# Patient Record
Sex: Female | Born: 1985 | Hispanic: No | Marital: Married | Smoking: Never smoker
Health system: Southern US, Community
[De-identification: ages and names within clinical notes are randomized; demographics above are authoritative.]

## PROBLEM LIST (undated history)

## (undated) ENCOUNTER — Inpatient Hospital Stay (HOSPITAL_COMMUNITY): Payer: Self-pay

## (undated) DIAGNOSIS — R7303 Prediabetes: Secondary | ICD-10-CM

## (undated) DIAGNOSIS — L509 Urticaria, unspecified: Secondary | ICD-10-CM

## (undated) DIAGNOSIS — T7840XA Allergy, unspecified, initial encounter: Secondary | ICD-10-CM

## (undated) DIAGNOSIS — O24419 Gestational diabetes mellitus in pregnancy, unspecified control: Secondary | ICD-10-CM

## (undated) HISTORY — DX: Allergy, unspecified, initial encounter: T78.40XA

## (undated) HISTORY — DX: Urticaria, unspecified: L50.9

## (undated) HISTORY — PX: WISDOM TOOTH EXTRACTION: SHX21

## (undated) NOTE — *Deleted (*Deleted)
It was great to see you!  Our plans for today:  - *** -   We are checking some labs today, I will call you if they are abnormal will send you a MyChart message or a letter if they are normal.  If you do not hear about your labs in the next 2 weeks please let us know.***  Take care and seek immediate care sooner if you develop any concerns.   Dr. Rushie Brazel [facility]  

## (undated) NOTE — *Deleted (*Deleted)
It was nice seeing you today, ***.  Today, we talked about ***. ***  Stay well, Karianne Nogueira, MD  

---

## 2017-09-28 ENCOUNTER — Encounter (HOSPITAL_COMMUNITY): Payer: Self-pay | Admitting: Emergency Medicine

## 2017-09-28 ENCOUNTER — Ambulatory Visit (HOSPITAL_COMMUNITY)
Admission: EM | Admit: 2017-09-28 | Discharge: 2017-09-28 | Disposition: A | Discharge status: Home / Self Care | Payer: Self-pay | Attending: Family Medicine | Admitting: Family Medicine

## 2017-09-28 DIAGNOSIS — B3731 Acute candidiasis of vulva and vagina: Secondary | ICD-10-CM

## 2017-09-28 DIAGNOSIS — B373 Candidiasis of vulva and vagina: Secondary | ICD-10-CM

## 2017-09-28 MED ORDER — CEPHALEXIN 500 MG PO CAPS: 500.0000 mg | ORAL_CAPSULE | Freq: Two times a day (BID) | ORAL | 0 refills | Status: DC

## 2017-09-28 MED ORDER — FLUCONAZOLE 100 MG PO TABS: 150.0000 mg | ORAL_TABLET | Freq: Once | ORAL | 0 refills | Status: AC

## 2017-09-28 NOTE — ED Triage Notes (Signed)
PT C/O: pt c/o creamy/blood vag d/c onset 4 days ... Pt recently gave birth to baby boy on 09/02/17; vag delivery w/hx of preeclampsia   Reports bleeding has stopped but concerned b/c she has body aches   Sts she was placed on Labetalol   DENIES: fevers,, abd pain, vomiting, diarrhea  Taking Ibuprofen 800 mg   A&O x4... NAD... Ambulatory

## 2017-09-28 NOTE — ED Provider Notes (Signed)
[place]   [id number] 09/28/17 Arrival Time: 1239   SUBJECTIVE:  Cindy Howell is a 32 y.o. female who presents to the urgent care with complaint of vaginal discharge approximately 1 month after delivering her son.  Patient recently moved from [place], [place].  She initially had some bloody discharge but the discharge now has no blood but is lumpy.  Patient is originally from [place].  She is happily married and the baby is doing well although she did have questions about the child spitting up.    History reviewed. No pertinent past medical history. History reviewed. No pertinent family history. Social History   Socioeconomic History  . Marital status: Married    Spouse name: Not on file  . Number of children: Not on file  . Years of education: Not on file  . Highest education level: Not on file  Social Needs  . Financial resource strain: Not on file  . Food insecurity - worry: Not on file  . Food insecurity - inability: Not on file  . Transportation needs - medical: Not on file  . Transportation needs - non-medical: Not on file  Occupational History  . Not on file  Tobacco Use  . Smoking status: Never Smoker  . Smokeless tobacco: Never Used  Substance and Sexual Activity  . Alcohol use: Not on file  . Drug use: Not on file  . Sexual activity: Not on file  Other Topics Concern  . Not on file  Social History Narrative  . Not on file   Current Meds  Medication Sig  . docusate sodium (COLACE) 100 MG capsule Take 100 mg by mouth 2 (two) times daily.  Marland Kitchen ibuprofen (ADVIL,MOTRIN) 800 MG tablet Take 800 mg by mouth every 8 (eight) hours as needed.  . prenatal vitamin w/FE, FA (NATACHEW) 29-1 MG CHEW chewable tablet Chew 1 tablet by mouth daily at 12 noon.   Allergies  Allergen Reactions  . Beef-Derived Products Rash  . Goat-Derived Products Rash  . Shrimp [Shellfish Allergy] Rash      ROS: As per HPI, remainder of ROS  negative.   OBJECTIVE:   Vitals:   09/28/17 1311  BP: 122/83  Pulse: 78  Resp: 20  Temp: 97.7 F (36.5 C)  TempSrc: Oral  SpO2: 99%     General appearance: alert; no distress Eyes: PERRL; EOMI; conjunctiva normal HENT: normocephalic; atraumatic;external ears normal without trauma; nasal mucosa normal; oral mucosa normal Neck: supple Genitalia:  Reddened episiotomy site with mild vaginal wall erythema as well Back: no CVA tenderness Extremities: no cyanosis or edema; symmetrical with no gross deformities Skin: warm and dry Neurologic: normal gait; grossly normal Psychological: alert and cooperative; normal mood and affect      Labs:  No results found for this or any previous visit.  Labs Reviewed - No data to display  No results found.     ASSESSMENT & PLAN:  1. Yeast vaginitis     Meds ordered this encounter  Medications  . fluconazole (DIFLUCAN) 100 MG tablet    Sig: Take 1.5 tablets (150 mg total) by mouth once for 1 dose. Repeat if needed    Dispense:  5 tablet    Refill:  0  . cephALEXin (KEFLEX) 500 MG capsule    Sig: Take 1 capsule (500 mg total) by mouth 2 (two) times daily.    Dispense:  14 capsule    Refill:  0   Also, patient has mild cellulitis at episiotomy site.  Reviewed expectations re: course of current medical issues. Questions answered. Outlined signs and symptoms indicating need for more acute intervention. Patient verbalized understanding. After Visit Summary given.    Procedures:      Robyn Haber, MD 09/28/17 1417

## 2017-11-06 ENCOUNTER — Ambulatory Visit: Payer: Self-pay | Admitting: Licensed Clinical Social Worker

## 2017-11-06 DIAGNOSIS — Z630 Problems in relationship with spouse or partner: Secondary | ICD-10-CM

## 2017-11-07 NOTE — Progress Notes (Signed)
 THERAPY PROGRESS NOTE  Session Time: 60 min  Participation Level: Active  Behavioral Response: Well GroomedAlertAnxious  Type of Therapy: Family Therapy  Treatment Goals addressed: Communication: Marriage Counseling and Coping  Interventions: Supportive  Summary: Cindy Howell is a 32 y.o. female who presented with a primarily euthymic mood but also slightly anxious presention and appropriate affect. Her husband Cindy Howell presented with an anxious mood and accompanying affect. Cindy Howell speaks English fluenty but Cindy Howell reported their preference is to have a Bengali interpreter during their sessions for Cindy Howell's limited English proficiency. Cindy Howell and Cindy Howell are seeking marital counseling to help cope with their recent life changes and adjustments and feelings a lack of respect from the other regarding these major life decisions. 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 This was expressed as a difficult and lonely transition and Cindy Howell reports he was under a debilitating amount of pressure and stress. The funding for his program was cut and he felt no other option but to move and find another program. During this difficult time, Cindy Howell decided that she wanted to try and conceive and became pregnant without the consent of Cindy Howell. Cindy Howell reports that the pregnancy has felt like an immense burden to his schooling an career and that this decision was made without his opinion. Cindy Howell reports that the sudden move from [place] to [place] made January 2019 just 21 days after the birth of their child was done without her consent and apparent disapproval. Cindy Howell reports this was significant distress on her health and that she had pre gestational diabetes during pregnancy and had some complications that made her feel unwell leading up to delivery and in the weeks to follow. Since arriving in [place] and having a newborn child, Cindy Howell and Cindy Howell are  experiencing financial stress and marital conflict.They did state that the organization [employer] has been a major support for their pregnancy and helping with items for the baby. Both report feeling like the other does not care enough about their desires. A brief historical overview of their relationship history is that they have been in one another's lives in 2013. [name] had a previous marriage and has a 13 year old son in [place] that she had limited conflict with during the early years of his life. [name] and Cindy Howell met during the period of separation from her ex-husband. Because of this divorce and family and cultural disapproval, the marriage of Cindy Howell and Cindy Howell was not supported by their families. They both appear to be motivated to work towards communion goals and more positive communication and decision practices.    Suicidal/Homicidal: Nowithout intent/plan  Therapist Response: [name] utilized initial rapport and relationship building with Cindy Howell and Cindy Howell to make them feel comfortable and welcome for their first session. [name] went over logistics with the couple such as filling out new HIPPA forms, going over confidentiality, no-show policy, and offering the application for the Orange Card for Cindy Howell (Cindy Howell receives University insurance at this time) after they have been in [place] for atleast 3 months. [name] offered empathetic responses and active listening. As Cindy Howell shared openly for the majority of the session about his concers, [name] created boundaries for Cindy Howell to have opportunities to respond and offer insight as well. [name] affirmed and encouraged the couple for their desire and openness to seek counseling and for their motivation to grow in their relationship and positive interactions with one another. [name] ended   the session with going over the plan for the next meeting regarding goal setting and treatment planning. A follow-up session was  scheduled for 2/27 @ 4 pm.   Plan: Return again in 1 week      Cindy Howell, [place] [employer] 11/07/2017

## 2017-11-12 ENCOUNTER — Ambulatory Visit: Payer: Self-pay | Admitting: Licensed Clinical Social Worker

## 2017-11-12 DIAGNOSIS — Z630 Problems in relationship with spouse or partner: Secondary | ICD-10-CM

## 2017-11-13 NOTE — Progress Notes (Signed)
THERAPY PROGRESS NOTE  Session Time: 60 min  Participation Level: Active  Behavioral Response: Well GroomedAlertAnxious  Type of Therapy: Family Therapy  Treatment Goals addressed: Coping  Interventions: Supportive  Summary: Cindy Howell is a 32 y.o. female who presented with a euthymic mood and appropriate affect. Cindy Howell presented with a slightly anxious mood and appropriate affect. Both Cindy Howell and Cindy Howell came to the session prepared and motivated to Howell on identifying goals. Because the initial session allotted more time for Cindy Howell to speak due to language barriers for Cindy Howell, Cindy Howell came to the session with a list of things she had written down and time was given for her in the session to disclose her concerns she had written down. Cindy Howell stated that when he left the initial session last time, he felt as if he had dominated the conversation and wanted to allow more time for Cindy Howell to express herself as well. Cindy Howell noted that her primary concerns of feeling a lack of support from Cindy Howell are in regards to the lack of household activities Cindy Howell engages in and a lack of willingness to help her with daily tasks in the home and for their baby. Cindy Howell noted that his primary concerns are feeling the weight of the stress of his schooling and daily responsibilities while immediately returning home and being asked to take on more. Both Cindy Howell and Cindy Howell were receptive and listened attentively as one another shared their need for more support. The primary treatment goals identified in this session by both Cindy Howell and Cindy Howell were around cultivating patience and managing temper, and learning better time management for one another. SMART short-term goals identified were for Cindy Howell to be responsible for groceries and shopping 2 days out of the week, to vacuum once a week and to help out with laundry. Cindy Howell expressed her frustration of doing household task while witnessing Cindy Howell watching tv or "wasting time"  scrolling through social media.  Cindy Howell and Cindy Howell also set a SMART goal of Cindy Howell allowing Cindy Howell a realistic 30 min of decompression and relaxation once returning home before requesting assistance from him in household tasks. These goals were their primary concerns and areas they feel would be most helpful to make some small specific behavior changes around. Cindy Howell expressed that Cindy Howell had some reservations about her English and about saying certain things in front of him. Cindy Howell asked if he should step out to allow time for Cindy Howell to express herself but both Cindy Howell and Cindy Howell accepted that this was not the process of couples counseling and were willing to Howell through the discomfort and both stay present throughout the duration of the hour. The couple noted that the best time for them to meet for counseling in the future is Friday at 4pm when possible.   Suicidal/Homicidal: Nowithout intent/plan  Therapist Response: [name] greeted Transport planner with hospitality and continued to Howell on Psychologist, counselling. [name] explained to the couple that she and her supervisor had looked for someone that could interpret Bengali for Cindy Howell but were not able to find someone in the local area. Because of this barrier, [name] explain to Cindy Howell some boundaries of how the sessions will operate, including saying "time out" at times when Cindy Howell is speaking to slow down and allow for Cindy Howell to express herself. [name] gave Cindy Howell and [place] information about the next Cindy Howell at [place] in April that she could come to once she has been in  the county for 3 months. Cindy Howell and Cindy Howell were also asked if they were interested in becoming patients at the clinic after it was noted that Cindy Howell, Cindy Howell, Cindy Howell does not have a primary care doctor at this time. [name] affirmed Insurance risk surveyor for taking the time to write down her thoughts and bring them into the session as well as  affirmed her attempt to speak English and  encouraged her to feel more comfortable doing so. [name] guided Cindy Howell and Cindy Howell in identifying their primary concerns and treatment goals for moving forward. A follow-up session has been scheduled for 3/8 @ 4pm.    Plan: Return again in 1 week.    Cindy Howell, Cindy Howell 11/13/2017

## 2017-11-14 NOTE — Addendum Note (Signed)
Addended by: Metta Clines on: 11/14/2017 03:36 PM   Modules accepted: Level of Service

## 2017-11-21 ENCOUNTER — Other Ambulatory Visit: Payer: Self-pay | Admitting: Licensed Clinical Social Worker

## 2017-12-04 ENCOUNTER — Other Ambulatory Visit: Payer: Self-pay | Admitting: Licensed Clinical Social Worker

## 2017-12-12 ENCOUNTER — Other Ambulatory Visit: Payer: Self-pay | Admitting: Licensed Clinical Social Worker

## 2017-12-25 ENCOUNTER — Ambulatory Visit: Payer: Self-pay | Admitting: Student in an Organized Health Care Education/Training Program

## 2017-12-30 ENCOUNTER — Ambulatory Visit: Payer: Self-pay | Admitting: Student in an Organized Health Care Education/Training Program

## 2017-12-31 ENCOUNTER — Ambulatory Visit (INDEPENDENT_AMBULATORY_CARE_PROVIDER_SITE_OTHER): Payer: Self-pay | Admitting: Family Medicine

## 2017-12-31 ENCOUNTER — Other Ambulatory Visit: Payer: Self-pay

## 2017-12-31 ENCOUNTER — Encounter: Payer: Self-pay | Admitting: Family Medicine

## 2017-12-31 VITALS — BP 110/70 | HR 91 | Wt 171.0 lb

## 2017-12-31 DIAGNOSIS — Z7689 Persons encountering health services in other specified circumstances: Secondary | ICD-10-CM

## 2017-12-31 DIAGNOSIS — M2550 Pain in unspecified joint: Secondary | ICD-10-CM

## 2017-12-31 DIAGNOSIS — Z8632 Personal history of gestational diabetes: Secondary | ICD-10-CM

## 2017-12-31 MED ORDER — RANITIDINE HCL 150 MG PO TABS: 150.0000 mg | ORAL_TABLET | Freq: Two times a day (BID) | ORAL | 3 refills | Status: DC | PRN

## 2017-12-31 NOTE — Patient Instructions (Addendum)
It was great seeing you today!  I have sent in ranitidine to take as needed for indigestion.   Please let me know if your joint pains persist and we can consider further work up.  If you have questions or concerns please do not hesitate to call at [phone].  Lucila Maine, DO PGY-2, [facility] 12/31/2017 4:16 PM   Health Maintenance, Female Adopting a healthy lifestyle and getting preventive care can go a long way to promote health and wellness. Talk with your health care provider about what schedule of regular examinations is right for you. This is a good chance for you to check in with your provider about disease prevention and staying healthy. In between checkups, there are plenty of things you can do on your own. Experts have done a lot of research about which lifestyle changes and preventive measures are most likely to keep you healthy. Ask your health care provider for more information. Weight and diet Eat a healthy diet  Be sure to include plenty of vegetables, fruits, low-fat dairy products, and lean protein.  Do not eat a lot of foods high in solid fats, added sugars, or salt.  Get regular exercise. This is one of the most important things you can do for your health. ? Most adults should exercise for at least 150 minutes each week. The exercise should increase your heart rate and make you sweat (moderate-intensity exercise). ? Most adults should also do strengthening exercises at least twice a week. This is in addition to the moderate-intensity exercise.  Maintain a healthy weight  Body mass index (BMI) is a measurement that can be used to identify possible weight problems. It estimates body fat based on height and weight. Your health care provider can help determine your BMI and help you achieve or maintain a healthy weight.  For females 58 years of age and older: ? A BMI below 18.5 is considered underweight. ? A BMI of 18.5 to 24.9 is normal. ? A BMI  of 25 to 29.9 is considered overweight. ? A BMI of 30 and above is considered obese.  Watch levels of cholesterol and blood lipids  You should start having your blood tested for lipids and cholesterol at 30 years of age, then have this test every 5 years.  You may need to have your cholesterol levels checked more often if: ? Your lipid or cholesterol levels are high. ? You are older than 30 years of age. ? You are at high risk for heart disease.  Cancer screening Lung Cancer  Lung cancer screening is recommended for adults 24-32 years old who are at high risk for lung cancer because of a history of smoking.  A yearly low-dose CT scan of the lungs is recommended for people who: ? Currently smoke. ? Have quit within the past 15 years. ? Have at least a 30-pack-year history of smoking. A pack year is smoking an average of one pack of cigarettes a day for 1 year.  Yearly screening should continue until it has been 15 years since you quit.  Yearly screening should stop if you develop a health problem that would prevent you from having lung cancer treatment.  Breast Cancer  Practice breast self-awareness. This means understanding how your breasts normally appear and feel.  It also means doing regular breast self-exams. Let your health care provider know about any changes, no matter how small.  If you are in your 20s or 30s, you should have a clinical  breast exam (CBE) by a health care provider every 1-3 years as part of a regular health exam.  If you are 39 or older, have a CBE every year. Also consider having a breast X-ray (mammogram) every year.  If you have a family history of breast cancer, talk to your health care provider about genetic screening.  If you are at high risk for breast cancer, talk to your health care provider about having an MRI and a mammogram every year.  Breast cancer gene (BRCA) assessment is recommended for women who have family members with BRCA-related  cancers. BRCA-related cancers include: ? Breast. ? Ovarian. ? Tubal. ? Peritoneal cancers.  Results of the assessment will determine the need for genetic counseling and BRCA1 and BRCA2 testing.  Cervical Cancer Your health care provider may recommend that you be screened regularly for cancer of the pelvic organs (ovaries, uterus, and vagina). This screening involves a pelvic examination, including checking for microscopic changes to the surface of your cervix (Pap test). You may be encouraged to have this screening done every 3 years, beginning at age 71.  For women ages 46-65, health care providers may recommend pelvic exams and Pap testing every 3 years, or they may recommend the Pap and pelvic exam, combined with testing for human papilloma virus (HPV), every 5 years. Some types of HPV increase your risk of cervical cancer. Testing for HPV may also be done on women of any age with unclear Pap test results.  Other health care providers may not recommend any screening for nonpregnant women who are considered low risk for pelvic cancer and who do not have symptoms. Ask your health care provider if a screening pelvic exam is right for you.  If you have had past treatment for cervical cancer or a condition that could lead to cancer, you need Pap tests and screening for cancer for at least 20 years after your treatment. If Pap tests have been discontinued, your risk factors (such as having a new sexual partner) need to be reassessed to determine if screening should resume. Some women have medical problems that increase the chance of getting cervical cancer. In these cases, your health care provider may recommend more frequent screening and Pap tests.  Colorectal Cancer  This type of cancer can be detected and often prevented.  Routine colorectal cancer screening usually begins at 30 years of age and continues through 30 years of age.  Your health care provider may recommend screening at an  earlier age if you have risk factors for colon cancer.  Your health care provider may also recommend using home test kits to check for hidden blood in the stool.  A small camera at the end of a tube can be used to examine your colon directly (sigmoidoscopy or colonoscopy). This is done to check for the earliest forms of colorectal cancer.  Routine screening usually begins at age 55.  Direct examination of the colon should be repeated every 5-10 years through 30 years of age. However, you may need to be screened more often if early forms of precancerous polyps or small growths are found.  Skin Cancer  Check your skin from head to toe regularly.  Tell your health care provider about any new moles or changes in moles, especially if there is a change in a mole's shape or color.  Also tell your health care provider if you have a mole that is larger than the size of a pencil eraser.  Always use sunscreen. Apply  sunscreen liberally and repeatedly throughout the day.  Protect yourself by wearing long sleeves, pants, a wide-brimmed hat, and sunglasses whenever you are outside.  Heart disease, diabetes, and high blood pressure  High blood pressure causes heart disease and increases the risk of stroke. High blood pressure is more likely to develop in: ? People who have blood pressure in the high end of the normal range (130-139/85-89 mm Hg). ? People who are overweight or obese. ? People who are African American.  If you are 19-67 years of age, have your blood pressure checked every 3-5 years. If you are 89 years of age or older, have your blood pressure checked every year. You should have your blood pressure measured twice-once when you are at a hospital or clinic, and once when you are not at a hospital or clinic. Record the average of the two measurements. To check your blood pressure when you are not at a hospital or clinic, you can use: ? An automated blood pressure machine at a  pharmacy. ? A home blood pressure monitor.  If you are between 71 years and 66 years old, ask your health care provider if you should take aspirin to prevent strokes.  Have regular diabetes screenings. This involves taking a blood sample to check your fasting blood sugar level. ? If you are at a normal weight and have a low risk for diabetes, have this test once every three years after 30 years of age. ? If you are overweight and have a high risk for diabetes, consider being tested at a younger age or more often. Preventing infection Hepatitis B  If you have a higher risk for hepatitis B, you should be screened for this virus. You are considered at high risk for hepatitis B if: ? You were born in a country where hepatitis B is common. Ask your health care provider which countries are considered high risk. ? Your parents were born in a high-risk country, and you have not been immunized against hepatitis B (hepatitis B vaccine). ? You have HIV or AIDS. ? You use needles to inject street drugs. ? You live with someone who has hepatitis B. ? You have had sex with someone who has hepatitis B. ? You get hemodialysis treatment. ? You take certain medicines for conditions, including cancer, organ transplantation, and autoimmune conditions.  Hepatitis C  Blood testing is recommended for: ? Everyone born from [birth year] through 1963. ? Anyone with known risk factors for hepatitis C.  Sexually transmitted infections (STIs)  You should be screened for sexually transmitted infections (STIs) including gonorrhea and chlamydia if: ? You are sexually active and are younger than 30 years of age. ? You are older than 30 years of age and your health care provider tells you that you are at risk for this type of infection. ? Your sexual activity has changed since you were last screened and you are at an increased risk for chlamydia or gonorrhea. Ask your health care provider if you are at risk.  If you do not  have HIV, but are at risk, it may be recommended that you take a prescription medicine daily to prevent HIV infection. This is called pre-exposure prophylaxis (PrEP). You are considered at risk if: ? You are sexually active and do not regularly use condoms or know the HIV status of your partner(s). ? You take drugs by injection. ? You are sexually active with a partner who has HIV.  Talk with your health care provider  about whether you are at high risk of being infected with HIV. If you choose to begin PrEP, you should first be tested for HIV. You should then be tested every 3 months for as long as you are taking PrEP. Pregnancy  If you are premenopausal and you may become pregnant, ask your health care provider about preconception counseling.  If you may become pregnant, take 400 to 800 micrograms (mcg) of folic acid every day.  If you want to prevent pregnancy, talk to your health care provider about birth control (contraception). Osteoporosis and menopause  Osteoporosis is a disease in which the bones lose minerals and strength with aging. This can result in serious bone fractures. Your risk for osteoporosis can be identified using a bone density scan.  If you are 51 years of age or older, or if you are at risk for osteoporosis and fractures, ask your health care provider if you should be screened.  Ask your health care provider whether you should take a calcium or vitamin D supplement to lower your risk for osteoporosis.  Menopause may have certain physical symptoms and risks.  Hormone replacement therapy may reduce some of these symptoms and risks. Talk to your health care provider about whether hormone replacement therapy is right for you. Follow these instructions at home:  Schedule regular health, dental, and eye exams.  Stay current with your immunizations.  Do not use any tobacco products including cigarettes, chewing tobacco, or electronic cigarettes.  If you are pregnant,  do not drink alcohol.  If you are breastfeeding, limit how much and how often you drink alcohol.  Limit alcohol intake to no more than 1 drink per day for nonpregnant women. One drink equals 12 ounces of beer, 5 ounces of wine, or 1 ounces of hard liquor.  Do not use street drugs.  Do not share needles.  Ask your health care provider for help if you need support or information about quitting drugs.  Tell your health care provider if you often feel depressed.  Tell your health care provider if you have ever been abused or do not feel safe at home. This information is not intended to replace advice given to you by your health care provider. Make sure you discuss any questions you have with your health care provider. Document Released: 03/18/2011 Document Revised: 02/08/2016 Document Reviewed: 06/06/2015 Elsevier Interactive Patient Education  [employer].

## 2017-12-31 NOTE — Progress Notes (Signed)
    Subjective:    Patient ID: Cindy Howell, female    DOB: [birth year], 32 y.o.   MRN: [id number]   CC: establish care  PMH- none Meds- prenatal vits Allergies- none Surg Hx- none FH- DM and CAD SH- lives with husband, baby, no alcohol drugs or smoking Smoking status reviewed- never smoker  Reports joint pains- elbows, ankles. Pain worse in morning. Pain gets better throughout day. She has not taken any medication for this. She reports this started after having her son, who is now about 44 months old. The pain has been getting better day to day but she wanted to ask about it.   Review of Systems- no fevers, chills, rashes, no joint swelling, no unintentional weight loss  Objective:  BP 110/70   Pulse 91   Wt 171 lb (77.6 kg)   SpO2 98%  Vitals and nursing note reviewed  General: well nourished, in no acute distress HEENT: normocephalic, MMM Neck: supple, non-tender, without lymphadenopathy Cardiac: RRR, clear S1 and S2, no murmurs, rubs, or gallops Respiratory: clear to auscultation bilaterally, no increased work of breathing Abdomen: soft, nontender, nondistended, no masses or organomegaly. Bowel sounds present Extremities: no edema or cyanosis. Skin: warm and dry, no rashes noted Neuro: alert and oriented, no focal deficits  Assessment & Plan:   1. Encounter to establish care Overall this is a healthy 32 year old G2P2. Health mainetance wise she is due for a pap in our system but reports having one last year in [place] when pregnant. Discussed repeating pap smear at next visit in 1 year.  2. History of gestational diabetes Discussed with patient, will not obtain labs today but encouraged diet and exercise. Will monitor for development of diabetes in future.   3. Pain in joint involving multiple sites For now no concerning signs or symptoms to warrant further work up. Advised tylenol as needed for joint pains. If pain persists would get basic labs including ESR, CRP,  ANA, RF. Follow up as needed.  Return in about 1 year (around 01/01/2019), or as needed.   Lucila Maine, DO Family Medicine Resident PGY-2

## 2018-01-22 ENCOUNTER — Ambulatory Visit: Payer: Self-pay | Admitting: Internal Medicine

## 2018-01-23 ENCOUNTER — Other Ambulatory Visit: Payer: Self-pay

## 2018-01-23 ENCOUNTER — Encounter: Payer: Self-pay | Admitting: Family Medicine

## 2018-01-23 ENCOUNTER — Ambulatory Visit (INDEPENDENT_AMBULATORY_CARE_PROVIDER_SITE_OTHER): Payer: Self-pay | Admitting: Family Medicine

## 2018-01-23 VITALS — BP 102/70 | HR 81 | Temp 98.1°F | Wt 175.0 lb

## 2018-01-23 DIAGNOSIS — J302 Other seasonal allergic rhinitis: Secondary | ICD-10-CM

## 2018-01-23 DIAGNOSIS — J3089 Other allergic rhinitis: Secondary | ICD-10-CM | POA: Insufficient documentation

## 2018-01-23 MED ORDER — FLUTICASONE PROPIONATE 50 MCG/ACT NA SUSP: 1.0000 | Freq: Every day | NASAL | 0 refills | Status: DC

## 2018-01-23 MED ORDER — LORATADINE 10 MG PO TBDP: 10.0000 mg | ORAL_TABLET | Freq: Every day | ORAL | 12 refills | Status: DC

## 2018-01-23 NOTE — Patient Instructions (Addendum)
Flonase 1 spray in each nostril daily. Can go up to 2 sprays in each nostril if needed.  Loratadine 10mg  daily.   Please make an appointment with your regular doctor to talk about your weight concerns.

## 2018-01-23 NOTE — Progress Notes (Signed)
    Subjective:  Cindy Howell is a 32 y.o. female who presents to the [facility] today with a chief complaint of congestion. History taken by video interpreter.  HPI:  Having runny nose, congestion, sneezing a lot. Has been going on for 2 weeks. Worse in the mornings. Has been getting a little better for the past 1 week. No cough. Has not tried any OTC medications.  States no having diarrhea right now, did have 2 days ago but since self resolved.  No fever/chills. No nausea/vomiting.   ROS: Per HPI  Objective:  Physical Exam: BP 102/70   Pulse 81   Temp 98.1 F (36.7 C) (Oral)   Wt 175 lb (79.4 kg)   LMP 01/21/2018   SpO2 98%   Gen: NAD, resting comfortably HEENT: [place], AT. TMs pearly with good light reflex bilaterally. Oropharynx is nonerythematous.  CV: RRR with no murmurs appreciated Pulm: NWOB, CTAB with no crackles, wheezes, or rhonchi GI: Normal bowel sounds present. Soft, Nontender, Nondistended. MSK: no edema, cyanosis, or clubbing noted Skin: warm, dry Neuro: grossly normal, moves all extremities Psych: Normal affect and thought content   Assessment/Plan:  1. Seasonal allergic rhinitis, unspecified trigger Patient with sneezing and clear rhinorrhea consistent with allergic rhinitis. No signs of bacterial infection on exam. Patient to try flonase and loratadine.  - fluticasone (FLONASE) 50 MCG/ACT nasal spray; Place 1 spray into both nostrils daily. 1 spray in each nostril every day  Dispense: 16 g; Refill: 0 - loratadine (CLARITIN REDITABS) 10 MG dissolvable tablet; Take 1 tablet (10 mg total) by mouth daily. As needed for allergy symptoms  Dispense: 31 tablet; Refill: [place], DO PGY-2, [facility] 01/23/2018 4:04 PM

## 2018-01-30 ENCOUNTER — Ambulatory Visit (INDEPENDENT_AMBULATORY_CARE_PROVIDER_SITE_OTHER): Payer: Self-pay | Admitting: Family Medicine

## 2018-01-30 ENCOUNTER — Other Ambulatory Visit: Payer: Self-pay

## 2018-01-30 ENCOUNTER — Encounter: Payer: Self-pay | Admitting: Family Medicine

## 2018-01-30 VITALS — BP 112/70 | HR 74 | Temp 98.1°F | Ht 63.0 in | Wt 165.0 lb

## 2018-01-30 DIAGNOSIS — R635 Abnormal weight gain: Secondary | ICD-10-CM

## 2018-01-30 NOTE — Patient Instructions (Signed)
It was great to meet you today! Thank you for letting me participate in your care!  Today, we discussed your recent weight gain after pregnancy. Please make the changes to your diet that we discussed and increase your physical activity to at least 30 minutes a day for 5 times per week.  I have sent you a referral to Dr. Jenne Campus. Please call her to schedule an appointment.  Be well, Harolyn Rutherford, DO PGY-1, [facility]

## 2018-01-30 NOTE — Progress Notes (Signed)
     Subjective: Chief Complaint  Patient presents with  . gaining weight after birth     HPI: Cindy Howell is a 32 y.o. presenting to clinic today to discuss the following:  Weight Gain after pregnancy Patient expressed concern over gaining weight after pregnancy. Patient states she expected to lose weight and is concerned b/c she has not lost weight and has in fact gained weight. Patient is not exercising on a regular basis. Discussed regular dietary habits and portions with patient. Patient is consuming large amounts of carbs, snacking of foods high in fat and carbs regularly as well. Counseled patient on proper portion sizes and a balanced diet. Encouraged patient to begin to practice portion control, stop consuming snacks, and to consume more fresh vegetables and fruits.   Health Maintenance: none today     ROS noted in HPI.   Past Medical, Surgical, Social, and Family History Reviewed & Updated per EMR.   Pertinent Historical Findings include:   Social History   Tobacco Use  Smoking Status Never Smoker  Smokeless Tobacco Never Used    Objective: BP 112/70   Pulse 74   Temp 98.1 F (36.7 C) (Oral)   Ht 5\' 3"  (1.6 m)   Wt 165 lb (74.8 kg)   LMP 01/21/2018   SpO2 98%   BMI 29.23 kg/m  Vitals and nursing notes reviewed  Physical Exam  Constitutional: She is oriented to person, place, and time. She appears well-developed and well-nourished. No distress.  HENT:  Head: Normocephalic and atraumatic.  Eyes: Pupils are equal, round, and reactive to light. Conjunctivae and EOM are normal.  Neck: Normal range of motion. Neck supple.  Cardiovascular: Normal rate, regular rhythm, normal heart sounds and intact distal pulses.  No murmur heard. Pulmonary/Chest: Effort normal and breath sounds normal. No respiratory distress. She has no wheezes. She has no rales.  Abdominal: Soft. Bowel sounds are normal. She exhibits no distension. There is no tenderness. There is no  guarding.  Musculoskeletal: Normal range of motion. She exhibits no edema or tenderness.  Neurological: She is alert and oriented to person, place, and time.  Skin: Skin is warm and dry. Capillary refill takes less than 2 seconds. No rash noted. No erythema.  Psychiatric: She has a normal mood and affect.  Vitals reviewed.   No results found for this or any previous visit (from the past 72 hour(s)).  Assessment/Plan:  Weight gain Will make ambulatory referral to nutrition for patient to gain more understanding of proper balanced diet, exercise, and portion control in attempting weight loss.  Patient exhibiting no other symptoms such as cold intolerance, increased appetite, lethargy, poor concentration, or increased sleep to suggest hypothyroidism.  However, if symptoms continue with change in behavior and eating habits obtain TSH and free T3/T4.  PATIENT EDUCATION PROVIDED: See AVS    Diagnosis and plan along with any newly prescribed medication(s) were discussed in detail with this patient today. The patient verbalized understanding and agreed with the plan. Patient advised if symptoms worsen return to clinic or ER.   Health Maintainance:   Orders Placed This Encounter  Procedures  . Ambulatory Referral to DSME/T    Referral Priority:   Routine    Referral Type:   Consultation    Number of Visits Requested:   1    No orders of the defined types were placed in this encounter.   Harolyn Rutherford, DO 01/30/2018, 9:58 AM PGY-1, [place]

## 2018-02-06 DIAGNOSIS — E669 Obesity, unspecified: Secondary | ICD-10-CM | POA: Insufficient documentation

## 2018-02-06 DIAGNOSIS — R635 Abnormal weight gain: Secondary | ICD-10-CM | POA: Insufficient documentation

## 2018-02-06 NOTE — Assessment & Plan Note (Signed)
Will make ambulatory referral to nutrition for patient to gain more understanding of proper balanced diet, exercise, and portion control in attempting weight loss.  Patient exhibiting no other symptoms such as cold intolerance, increased appetite, lethargy, poor concentration, or increased sleep to suggest hypothyroidism.  However, if symptoms continue with change in behavior and eating habits obtain TSH and free T3/T4.

## 2018-02-19 ENCOUNTER — Ambulatory Visit (INDEPENDENT_AMBULATORY_CARE_PROVIDER_SITE_OTHER): Payer: Self-pay | Admitting: Family Medicine

## 2018-02-19 ENCOUNTER — Other Ambulatory Visit: Payer: Self-pay

## 2018-02-19 VITALS — BP 124/78 | HR 94 | Temp 98.5°F | Ht 63.0 in | Wt 176.8 lb

## 2018-02-19 DIAGNOSIS — L299 Pruritus, unspecified: Secondary | ICD-10-CM

## 2018-02-19 DIAGNOSIS — L700 Acne vulgaris: Secondary | ICD-10-CM | POA: Insufficient documentation

## 2018-02-19 MED ORDER — CLINDAMYCIN PHOS-BENZOYL PEROX 1-5 % EX GEL: Freq: Two times a day (BID) | CUTANEOUS | 0 refills | Status: DC

## 2018-02-19 MED ORDER — CETIRIZINE HCL 10 MG PO TABS: 10.0000 mg | ORAL_TABLET | Freq: Every day | ORAL | 1 refills | Status: DC

## 2018-02-19 MED ORDER — TRIAMCINOLONE ACETONIDE 0.1 % EX OINT: 1.0000 "application " | TOPICAL_OINTMENT | Freq: Two times a day (BID) | CUTANEOUS | 0 refills | Status: DC

## 2018-02-19 NOTE — Assessment & Plan Note (Addendum)
Likely related to food allergies as patient experiences every time she eats certain foods. Does also have history of seasonal allergies, prescribed zyrtec. Recommended avoiding foods she notices to cause reactions to avoid worsened reactions. Referral placed to allergist for more extensive testing. Can continue to use steroid cream for leg itching PRN, refill provided.

## 2018-02-19 NOTE — Patient Instructions (Signed)
It was great to see you!  For your rash and itching,  - It is likely you have a combination of acne and allergies to certain foods. - We are prescribing Benzaclin gel for your acne, and an oral antihistamine to help with itching, this is safe in breastfeeding. - We are also referring you to an allergy specialist for further allergy testing. You should avoid shrimp, goat meat, and any other foods that you find to make you itch. - Return to the clinic if no better in a few weeks.  Take care and seek immediate care sooner if you develop any concerns.   Dr. [facility]

## 2018-02-19 NOTE — Assessment & Plan Note (Signed)
Papular rash consistent with acne. Advised to avoid steroid cream on the face as that can make acne worse. Prescribed benzaclin gel. Follow up with PCP if no better in a few weeks.

## 2018-02-19 NOTE — Progress Notes (Signed)
   Subjective:   Patient ID: Cindy Howell    DOB: 1985/11/20, 32 y.o. female   MRN: [id number]  Cindy Howell is a 32 y.o. female with a history of seasonal allergies here for   Facial rash, itching Patient states she has had bumpy rash on face since she was about 7-8yo. She also gets itching on her face and legs whenever she eats certain foods (shrimp, goat meat, eggplant). She was prescribed betamethasone in her home country that helps with the itching. She sometimes will get a rash on her legs when she experiences itching. The itching has gotten better as she has gotten older but the facial rash is persistent. Previously used a grapefruit acne wash but it burned. She now uses a face wash for sensitive skin.   Review of Systems:  Per HPI.   [place], medications and smoking status reviewed.  Objective:   BP 124/78   Pulse 94   Temp 98.5 F (36.9 C) (Oral)   Ht 5\' 3"  (1.6 m)   Wt 176 lb 12.8 oz (80.2 kg)   LMP 01/21/2018   SpO2 98%   BMI 31.32 kg/m  Vitals and nursing note reviewed.  General: well nourished, well developed, in no acute distress with non-toxic appearance HEENT: normocephalic, atraumatic, moist mucous membranes CV: regular rate Lungs: normal work of breathing Skin: warm, dry. Papular rash to bilateral cheeks with open and closed comedones. No other rash or lesions visible. Extremities: warm and well perfused, normal tone MSK: ROM grossly intact, strength intact, gait normal Neuro: Alert and oriented, speech normal        Assessment & Plan:   Acne vulgaris Papular rash consistent with acne. Advised to avoid steroid cream on the face as that can make acne worse. Prescribed benzaclin gel. Follow up with PCP if no better in a few weeks.  Itching Likely related to food allergies as patient experiences every time she eats certain foods. Does also have history of seasonal allergies, prescribed zyrtec. Recommended avoiding foods she notices to cause reactions to  avoid worsened reactions. Referral placed to allergist for more extensive testing. Can continue to use steroid cream for leg itching PRN, refill provided.  Orders Placed This Encounter  Procedures  . Ambulatory referral to Allergy    Referral Priority:   Routine    Referral Type:   Allergy Testing    Referral Reason:   Specialty Services Required    Requested Specialty:   Allergy    Number of Visits Requested:   1   Meds ordered this encounter  Medications  . clindamycin-benzoyl peroxide (BENZACLIN) gel    Sig: Apply topically 2 (two) times daily.    Dispense:  35 g    Refill:  0  . cetirizine (ZYRTEC) 10 MG tablet    Sig: Take 1 tablet (10 mg total) by mouth daily.    Dispense:  30 tablet    Refill:  1  . triamcinolone ointment (KENALOG) 0.1 %    Sig: Apply 1 application topically 2 (two) times daily.    Dispense:  80 g    Refill:  0    Rory Percy, DO PGY-1, [facility] 02/19/2018 3:24 PM

## 2018-03-30 ENCOUNTER — Other Ambulatory Visit: Payer: Self-pay

## 2018-03-30 ENCOUNTER — Encounter: Payer: Self-pay | Admitting: Family Medicine

## 2018-03-30 ENCOUNTER — Ambulatory Visit (INDEPENDENT_AMBULATORY_CARE_PROVIDER_SITE_OTHER): Payer: Self-pay | Admitting: Family Medicine

## 2018-03-30 DIAGNOSIS — J302 Other seasonal allergic rhinitis: Secondary | ICD-10-CM

## 2018-03-30 MED ORDER — FLUTICASONE PROPIONATE 50 MCG/ACT NA SUSP: 1.0000 | Freq: Every day | NASAL | 2 refills | Status: DC

## 2018-03-30 MED ORDER — LORATADINE 10 MG PO TBDP: 10.0000 mg | ORAL_TABLET | Freq: Every day | ORAL | 12 refills | Status: DC

## 2018-03-30 NOTE — Progress Notes (Signed)
    Subjective:    Patient ID: Cindy Howell, female    DOB: 25-Mar-1986, 32 y.o.   MRN: [id number]   CC: fever, cold  Reports chills, feeling warm, achiness all over her body for past week. She had felt like she was improving but again had some aching today. She thinks she is sick because she has congestion, runny nose, and sneezing. No sick contacts, no one else in family sick. She is eating and drinking normally. She has not had a fever, she is taking her temperature and highest has been 99.   Smoking status reviewed- non-smoker  Review of Systems- see HPI   Objective:  BP 124/76   Pulse 80   Temp 98.4 F (36.9 C) (Oral)   Wt 179 lb (81.2 kg)   LMP 03/24/2018 (Exact Date)   SpO2 99%   BMI 31.71 kg/m  Vitals and nursing note reviewed  General: well nourished, in no acute distress HEENT: normocephalic, TM's visualized bilaterally, no scleral icterus or conjunctival pallor, no nasal discharge but boggy erythematous nasal turbinates bilaterally, moist mucous membranes, good dentition without erythema or discharge noted in posterior oropharynx Neck: supple, non-tender, without lymphadenopathy Cardiac: RRR, clear S1 and S2, no murmurs, rubs, or gallops Respiratory: clear to auscultation bilaterally, no increased work of breathing Extremities: no edema or cyanosis.  Skin: warm and dry, no rashes noted Neuro: alert and oriented, no focal deficits   Assessment & Plan:   1. Seasonal allergic rhinitis, unspecified trigger Congestion, runny nose likely due to seasonal allergies. Patient had been prescribed allergy medication at several other visits however is not taking them. Emphasized importance of this. Represcribed flonase and claritin. Follow up as needed  - fluticasone (FLONASE) 50 MCG/ACT nasal spray; Place 1 spray into both nostrils daily. 1 spray in each nostril every day  Dispense: 16 g; Refill: 2 - loratadine (CLARITIN REDITABS) 10 MG dissolvable tablet; Take 1 tablet (10 mg  total) by mouth daily. As needed for allergy symptoms  Dispense: 31 tablet; Refill: 12   Return if symptoms worsen or fail to improve.   Lucila Maine, DO Family Medicine Resident PGY-2

## 2018-03-30 NOTE — Patient Instructions (Signed)
   Try over the counter Benadryl Cream or Ointment for mosquito bites.  If you do have high fevers please come back to be seen but I think your symptoms are either from a virus or just from bad allergies.  If you have questions or concerns please do not hesitate to call at [phone].  Lucila Maine, DO PGY-2, [facility] 03/30/2018 4:41 PM

## 2018-03-31 ENCOUNTER — Encounter: Payer: Self-pay | Admitting: Allergy and Immunology

## 2018-03-31 ENCOUNTER — Ambulatory Visit (INDEPENDENT_AMBULATORY_CARE_PROVIDER_SITE_OTHER): Payer: No Typology Code available for payment source | Admitting: Allergy and Immunology

## 2018-03-31 VITALS — BP 110/68 | HR 94 | Temp 98.3°F | Resp 20 | Ht 63.6 in | Wt 179.8 lb

## 2018-03-31 DIAGNOSIS — J3089 Other allergic rhinitis: Secondary | ICD-10-CM

## 2018-03-31 DIAGNOSIS — R21 Rash and other nonspecific skin eruption: Secondary | ICD-10-CM | POA: Insufficient documentation

## 2018-03-31 DIAGNOSIS — Z91018 Allergy to other foods: Secondary | ICD-10-CM

## 2018-03-31 MED ORDER — LEVOCETIRIZINE DIHYDROCHLORIDE 5 MG PO TABS: 5.0000 mg | ORAL_TABLET | Freq: Every evening | ORAL | 1 refills | Status: DC

## 2018-03-31 MED ORDER — AZELASTINE HCL 0.1 % NA SOLN: 2.0000 | Freq: Two times a day (BID) | NASAL | 1 refills | Status: DC

## 2018-03-31 MED ORDER — AMMONIUM LACTATE 12 % EX CREA: TOPICAL_CREAM | Freq: Two times a day (BID) | CUTANEOUS | 1 refills | Status: DC | PRN

## 2018-03-31 NOTE — Assessment & Plan Note (Addendum)
Food allergen skin tests were negative today despite a positive histamine control.  The negative predictive value for food allergen skin testing is excellent, however to be thorough we will confirm with lab work.  A laboratory order form has been provided for serum specific IgE against shellfish panel and goat meat.  When lab results have returned the patient will be called with further recommendations.  For now, continue avoidance of shellfish and goat meat.

## 2018-03-31 NOTE — Assessment & Plan Note (Addendum)
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  It is unclear why the dermatitis seems to flare with specific foods, including shrimp, crab, and goat meat.  As there seems to be significant improvement with the avoidance of these foods, for now eliminate shrimp, crab, and goat meat from the diet.  Information regarding keratosis pilaris has been provided.  A prescription has been provided for AmLactin 12% sparingly to affected areas twice daily as needed.  If this problem persists or progresses, evaluation by a dermatologist is recommended.

## 2018-03-31 NOTE — Assessment & Plan Note (Signed)
   Aeroallergen avoidance measures have been discussed and provided in written form.  A prescription has been provided for levocetirizine, 5 mg daily as needed.  A prescription has been provided for azelastine nasal spray, 1-2 sprays per nostril 2 times daily as needed. Proper nasal spray technique has been discussed and demonstrated.   Nasal saline spray (i.e. Simply Saline) is recommended prior to medicated nasal sprays and as needed.

## 2018-03-31 NOTE — Progress Notes (Signed)
New Patient Note  RE: Cindy Howell MRN: [id number] DOB: [birth year] Date of Office Visit: 03/31/2018  Referring provider: Rory Percy, DO Primary care provider: Steve Rattler, DO  Chief Complaint: Rash; Food Intolerance; and Nasal Congestion   History of present illness: Cindy Howell is a 32 y.o. female seen today in consultation requested by Rory Percy, DO.  She is accompanied today by her husband who assists with the history.  For several years, she has experienced a rash on her cheeks.  The rash is described as small bumps which are typically flesh-colored, occasionally are mildly pruritic, and occasionally become mildly erythematous.  Her husband states that individual lesions are "sometimes with discharge like a pimple."  The patient believes that she is allergic to shrimp, crab, and goat meat because these foods seem to flare the rash.  She notes that there seemed to be a correlation between these foods and exacerbation of the rash when they lived in [place] where it was more humid, and here in [place] where it is humid, however when they lived in [place] the rash improved and she was able to consume these foods without exacerbating the rash.  [name] experiences nasal congestion, rhinorrhea, sneezing, and postnasal drainage.  These symptoms occur year-round but tend to be more frequent and severe during the spring and fall.  She takes fluticasone nasal spray in an attempt to control these symptoms.  Assessment and plan: History of food allergy Food allergen skin tests were negative today despite a positive histamine control.  The negative predictive value for food allergen skin testing is excellent, however to be thorough we will confirm with lab work.  A laboratory order form has been provided for serum specific IgE against shellfish panel and goat meat.  When lab results have returned the patient will be called with further recommendations.  For now, continue  avoidance of shellfish and goat meat.  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  It is unclear why the dermatitis seems to flare with specific foods, including shrimp, crab, and goat meat.  As there seems to be significant improvement with the avoidance of these foods, for now eliminate shrimp, crab, and goat meat from the diet.  Information regarding keratosis pilaris has been provided.  A prescription has been provided for AmLactin 12% sparingly to affected areas twice daily as needed.  If this problem persists or progresses, evaluation by a dermatologist is recommended.  Allergic rhinitis  Aeroallergen avoidance measures have been discussed and provided in written form.  A prescription has been provided for levocetirizine, 5 mg daily as needed.  A prescription has been provided for azelastine nasal spray, 1-2 sprays per nostril 2 times daily as needed. Proper nasal spray technique has been discussed and demonstrated.   Nasal saline spray (i.e. Simply Saline) is recommended prior to medicated nasal sprays and as needed.   Meds ordered this encounter  Medications  . ammonium lactate (AMLACTIN) 12 % cream    Sig: Apply topically 2 (two) times daily as needed for dry skin.    Dispense:  385 g    Refill:  1  . levocetirizine (XYZAL) 5 MG tablet    Sig: Take 1 tablet (5 mg total) by mouth every evening.    Dispense:  30 tablet    Refill:  1  . azelastine (ASTELIN) 0.1 % nasal spray    Sig: Place 2 sprays into both nostrils 2 (two) times daily.  Dispense:  30 mL    Refill:  1    Diagnostics: Environmental skin testing: Positive to weed pollen and dust mite antigen. Food allergen skin testing: Negative despite a positive histamine control.    Physical examination: Blood pressure 110/68, pulse 94, temperature 98.3 F (36.8 C), temperature source Oral, resp. rate 20, height 5' 3.6" (1.615 m), weight 179 lb 12.8  oz (81.6 kg), last menstrual period 03/24/2018, SpO2 97 %.  General: Alert, interactive, in no acute distress. HEENT: TMs pearly gray, turbinates moderately edematous without discharge, post-pharynx moderately erythematous. Neck: Supple without lymphadenopathy. Lungs: Clear to auscultation without wheezing, rhonchi or rales. CV: Normal S1, S2 without murmurs. Abdomen: Nondistended, nontender. Skin: 1-46mm non-erythematous papules on cheeks bilaterally. Extremities:  No clubbing, cyanosis or edema. Neuro:   Grossly intact.  Review of systems:  Review of systems negative except as noted in HPI / PMHx or noted below: Review of Systems  Constitutional: Negative.   HENT: Negative.   Eyes: Negative.   Respiratory: Negative.   Cardiovascular: Negative.   Gastrointestinal: Negative.   Genitourinary: Negative.   Musculoskeletal: Negative.   Skin: Negative.   Neurological: Negative.   Endo/Heme/Allergies: Negative.   Psychiatric/Behavioral: Negative.     Past medical history:  Past Medical History:  Diagnosis Date  . Urticaria     Past surgical history:  History reviewed. No pertinent surgical history.  Family history: History reviewed. No pertinent family history.  Social history: Social History   Socioeconomic History  . Marital status: Married    Spouse name: Not on file  . Number of children: Not on file  . Years of education: Not on file  . Highest education level: Not on file  Occupational History  . Not on file  Social Needs  . Financial resource strain: Not on file  . Food insecurity:    Worry: Not on file    Inability: Not on file  . Transportation needs:    Medical: Not on file    Non-medical: Not on file  Tobacco Use  . Smoking status: Never Smoker  . Smokeless tobacco: Never Used  Substance and Sexual Activity  . Alcohol use: Never    Frequency: Never  . Drug use: Never  . Sexual activity: Not on file  Lifestyle  . Physical activity:    Days per  week: Not on file    Minutes per session: Not on file  . Stress: Not on file  Relationships  . Social connections:    Talks on phone: Not on file    Gets together: Not on file    Attends religious service: Not on file    Active member of club or organization: Not on file    Attends meetings of clubs or organizations: Not on file    Relationship status: Not on file  . Intimate partner violence:    Fear of current or ex partner: Not on file    Emotionally abused: Not on file    Physically abused: Not on file    Forced sexual activity: Not on file  Other Topics Concern  . Not on file  Social History Narrative  . Not on file   Environmental History: The patient lives in an apartment with carpeting throughout and central air/heat.  There is no known mold/water damage in the home.  She is a non-smoker without pets.  Allergies as of 03/31/2018      Reactions   Beef-derived Products Rash   Goat-derived Products Rash  Shrimp [shellfish Allergy] Rash      Medication List        Accurate as of 03/31/18  3:58 PM. Always use your most recent med list.          ammonium lactate 12 % cream Commonly known as:  AMLACTIN Apply topically 2 (two) times daily as needed for dry skin.   azelastine 0.1 % nasal spray Commonly known as:  ASTELIN Place 2 sprays into both nostrils 2 (two) times daily.   cetirizine 10 MG tablet Commonly known as:  ZYRTEC Take 1 tablet (10 mg total) by mouth daily.   clindamycin-benzoyl peroxide gel Commonly known as:  BENZACLIN Apply topically 2 (two) times daily.   fluticasone 50 MCG/ACT nasal spray Commonly known as:  FLONASE Place 1 spray into both nostrils daily. 1 spray in each nostril every day   ibuprofen 800 MG tablet Commonly known as:  ADVIL,MOTRIN Take 800 mg by mouth every 8 (eight) hours as needed.   levocetirizine 5 MG tablet Commonly known as:  XYZAL Take 1 tablet (5 mg total) by mouth every evening.   loratadine 10 MG dissolvable  tablet Commonly known as:  CLARITIN REDITABS Take 1 tablet (10 mg total) by mouth daily. As needed for allergy symptoms   ranitidine 150 MG tablet Commonly known as:  ZANTAC Take 1 tablet (150 mg total) by mouth 2 (two) times daily as needed for heartburn.   triamcinolone ointment 0.1 % Commonly known as:  KENALOG Apply 1 application topically 2 (two) times daily.       Known medication allergies: Allergies  Allergen Reactions  . Beef-Derived Products Rash  . Goat-Derived Products Rash  . Shrimp [Shellfish Allergy] Rash    I appreciate the opportunity to take part in [place] care. Please do not hesitate to contact me with questions.  Sincerely,   R. Edgar Frisk, MD

## 2018-03-31 NOTE — Patient Instructions (Addendum)
History of food allergy Food allergen skin tests were negative today despite a positive histamine control.  The negative predictive value for food allergen skin testing is excellent, however to be thorough we will confirm with lab work.  A laboratory order form has been provided for serum specific IgE against shellfish panel and goat meat.  When lab results have returned the patient will be called with further recommendations.  For now, continue avoidance of shellfish and goat meat.  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  It is unclear why the dermatitis seems to flare with specific foods, including shrimp, crab, and goat meat.  As there seems to be significant improvement with the avoidance of these foods, for now eliminate shrimp, crab, and goat meat from the diet.  Information regarding keratosis pilaris has been provided.  A prescription has been provided for AmLactin 12% sparingly to affected areas twice daily as needed.  If this problem persists or progresses, evaluation by a dermatologist is recommended.  Allergic rhinitis  Aeroallergen avoidance measures have been discussed and provided in written form.  A prescription has been provided for levocetirizine, 5 mg daily as needed.  A prescription has been provided for azelastine nasal spray, 1-2 sprays per nostril 2 times daily as needed. Proper nasal spray technique has been discussed and demonstrated.   Nasal saline spray (i.e. Simply Saline) is recommended prior to medicated nasal sprays and as needed.   When lab results have returned the patient will be called with further recommendations and follow up instructions.  Keratosis pilaris  Signs and symptoms Keratosis pilaris is a harmless skin disorder that causes small, acne-like bumps. Although it isn't serious, keratosis pilaris can be frustrating because it's difficult to treat.  Keratosis pilaris results  from a buildup of protein called keratin in the openings of hair follicles in the skin. This produces small, rough patches, usually on the arms and thighs, and can give skin a goose flesh or sandpaper appearance.   They usually don't hurt or itch. Typically, patches are skin colored, but they can, at times, be red and inflamed. Keratosis pilaris can also appear on the face, where it closely resembles acne. The small size of the bumps and its association with dry, chapped skin distinguish keratosis pilaris from pustular acne. Unlike elsewhere on the body, keratosis pilaris on the face may leave small scars. Though quite common with young children, keratosis pilaris can occur at any age.  It may improve, especially during the summer months, only to later worsen. Dry skin tends to worsen the condition.  Gradually, keratosis pilaris resolves on its own.  Many people are bothered by the goose flesh appearance of keratosis pilaris, but it doesn't have long-term health implications and occurs in otherwise healthy people.  Keratosis pilaris isn't a serious medical condition, and treatment usually isn't necessary.  Treatment No single treatment universally improves keratosis pilaris. But most options, including self-care measures and medicated creams, focus on softening the keratin deposits in the skin.  Self-care Although self-help measures won't cure keratosis pilaris, they may help improve the appearance of your skin. You may find these measures beneficial: . Be gentle when washing your skin. Vigorous scrubbing or removal of the plugs may only irritate your skin and aggravate the condition.  . After washing or bathing, gently pat or blot your skin dry with a towel so that some moisture remains on the skin.  Marland Kitchen Apply the moisturizing lotion or lubricating  cream while your skin is still moist from bathing. Choose a moisturizer that contains urea or propylene glycol, chemicals that soften dry, rough skin.  Marland Kitchen Apply  an over-the-counter product that contains lactic acid twice daily (ie, AmLactin or Lac-Hydrin 12% lotion). Lactic acid helps remove extra keratin from the surface of the skin.  . Use a humidifier to add moisture to the air inside your home. Low humidity dries out your skin.   Control of House Dust Mite Allergen  House dust mites play a major role in allergic asthma and rhinitis.  They occur in environments with high humidity wherever human skin, the food for dust mites is found. High levels have been detected in dust obtained from mattresses, pillows, carpets, upholstered furniture, bed covers, clothes and soft toys.  The principal allergen of the house dust mite is found in its feces.  A gram of dust may contain 1,000 mites and 250,000 fecal particles.  Mite antigen is easily measured in the air during house cleaning activities.    1. Encase mattresses, including the box spring, and pillow, in an air tight cover.  Seal the zipper end of the encased mattresses with wide adhesive tape. 2. Wash the bedding in water of 130 degrees Farenheit weekly.  Avoid cotton comforters/quilts and flannel bedding: the most ideal bed covering is the dacron comforter. 3. Remove all upholstered furniture from the bedroom. 4. Remove carpets, carpet padding, rugs, and non-washable window drapes from the bedroom.  Wash drapes weekly or use plastic window coverings. 5. Remove all non-washable stuffed toys from the bedroom.  Wash stuffed toys weekly. 6. Have the room cleaned frequently with a vacuum cleaner and a damp dust-mop.  The patient should not be in a room which is being cleaned and should wait 1 hour after cleaning before going into the room. 7. Close and seal all heating outlets in the bedroom.  Otherwise, the room will become filled with dust-laden air.  An electric heater can be used to heat the room. 8. Reduce indoor humidity to less than 50%.  Do not use a humidifier.  Reducing Pollen Exposure  The [employer]  [employer] suggests the following steps to reduce your exposure to pollen during allergy seasons.    1. Do not hang sheets or clothing out to dry; pollen may collect on these items. 2. Do not mow lawns or spend time around freshly cut grass; mowing stirs up pollen. 3. Keep windows closed at night.  Keep car windows closed while driving. 4. Minimize morning activities outdoors, a time when pollen counts are usually at their highest. 5. Stay indoors as much as possible when pollen counts or humidity is high and on windy days when pollen tends to remain in the air longer. 6. Use air conditioning when possible.  Many air conditioners have filters that trap the pollen spores. 7. Use a HEPA room air filter to remove pollen form the indoor air you breathe.

## 2018-04-03 ENCOUNTER — Ambulatory Visit (INDEPENDENT_AMBULATORY_CARE_PROVIDER_SITE_OTHER): Payer: Self-pay | Admitting: Family Medicine

## 2018-04-03 ENCOUNTER — Other Ambulatory Visit: Payer: Self-pay

## 2018-04-03 VITALS — BP 118/72 | HR 108 | Temp 98.5°F | Ht 63.6 in | Wt 179.4 lb

## 2018-04-03 DIAGNOSIS — Z8759 Personal history of other complications of pregnancy, childbirth and the puerperium: Secondary | ICD-10-CM

## 2018-04-03 DIAGNOSIS — Z3200 Encounter for pregnancy test, result unknown: Secondary | ICD-10-CM

## 2018-04-03 DIAGNOSIS — Z3201 Encounter for pregnancy test, result positive: Secondary | ICD-10-CM

## 2018-04-03 DIAGNOSIS — Z862 Personal history of diseases of the blood and blood-forming organs and certain disorders involving the immune mechanism: Secondary | ICD-10-CM

## 2018-04-03 DIAGNOSIS — Z1389 Encounter for screening for other disorder: Secondary | ICD-10-CM

## 2018-04-03 LAB — POCT URINALYSIS DIP (MANUAL ENTRY)
BILIRUBIN UA: NEGATIVE
BILIRUBIN UA: NEGATIVE mg/dL
Blood, UA: NEGATIVE
Glucose, UA: NEGATIVE mg/dL
Leukocytes, UA: NEGATIVE
Nitrite, UA: NEGATIVE
PH UA: 7 (ref 5.0–8.0)
SPEC GRAV UA: 1.025 (ref 1.010–1.025)
Urobilinogen, UA: 0.2 E.U./dL

## 2018-04-03 LAB — ALLERGEN PROFILE, SHELLFISH
Clam IgE: <0.1 kU/L
F023-IgE Crab: <0.1 kU/L
F080-IgE Lobster: <0.1 kU/L
F290-IgE Oyster: <0.1 kU/L
Scallop IgE: <0.1 kU/L
Shrimp IgE: 0.12 kU/L — AB

## 2018-04-03 LAB — ALLERGEN, MUTTON, F88: Allergen Lamb IgE: <0.1 kU/L

## 2018-04-03 LAB — POCT URINE PREGNANCY: PREG TEST UR: POSITIVE — AB

## 2018-04-03 NOTE — Assessment & Plan Note (Signed)
Acute.  Patient competent about LMP.  GA [redacted]W[redacted]D based on LMP.  History of preeclampsia and GDM per family.  Family contemplating abortion but undecided.  She is not on prenatal vitamins. - Ambulatory referral to high-risk OB - Advised patient to take OTC prenatal vitamin daily - Checking OB panel

## 2018-04-03 NOTE — Addendum Note (Signed)
Addended by: Londell Moh T on: 04/03/2018 02:45 PM   Modules accepted: Orders

## 2018-04-03 NOTE — Progress Notes (Signed)
   Subjective   Patient ID: Cindy Howell    DOB: Jul 08, 1986, 32 y.o. female   MRN: [id number]  CC: "Possible pregnancy"  HPI: [name] is a 32 y.o. female who presents to clinic today for the following:  Possible pregnancy: Patient here today due to concern about being pregnant.  Patient afraid she may get preeclampsia based on her prior pregnancy which she delivered approximately 1 month earlier in [place] after she was diagnosed with preeclampsia due to elevated BPs along with GDM.  LMP 02/22/2018.  Patient is not on birth control.  She was recently seen for cold-like symptoms and has been taking fluticasone and loratadine for allergies.  She denies any alcohol or drug exposure as of recent.  She is very anxious about the possibility of pregnancy and the husband is currently a Ship broker and feels that he cannot help raise another child.  Patient denies vaginal bleeding or discharge, abdominal pain, fevers or chills.  ROS: see HPI for pertinent.  [place]: History of preeclampsia and GDM.  Surgical history T&A.  Family history unremarkable.  Smoking status reviewed. Medications reviewed.  Objective   BP 118/72   Pulse (!) 108   Temp 98.5 F (36.9 C) (Oral)   Ht 5' 3.6" (1.615 m)   Wt 179 lb 6.4 oz (81.4 kg)   LMP 03/24/2018 (Exact Date)   SpO2 98%   BMI 31.18 kg/m  Vitals and nursing note reviewed.  General: anxious female, well nourished, well developed, NAD with non-toxic appearance HEENT: normocephalic, atraumatic, moist mucous membranes Cardiovascular: regular rate and rhythm without murmurs, rubs, or gallops Lungs: clear to auscultation bilaterally with normal work of breathing Abdomen: soft, non-tender, non-distended, normoactive bowel sounds Skin: warm, dry, no rashes or lesions, cap refill < 2 seconds Extremities: warm and well perfused, normal tone, no edema  Assessment & Plan   Positive pregnancy test Acute.  Patient competent about LMP.  GA [redacted]W[redacted]D based on LMP.  History  of preeclampsia and GDM per family.  Family contemplating abortion but undecided.  She is not on prenatal vitamins. - Ambulatory referral to high-risk OB - Advised patient to take OTC prenatal vitamin daily - Checking OB panel  Orders Placed This Encounter  Procedures  . Obstetric Panel, Including HIV  . Ambulatory referral to Obstetrics / Gynecology    Referral Priority:   Routine    Referral Type:   Consultation    Referral Reason:   Specialty Services Required    Requested Specialty:   Obstetrics and Gynecology    Number of Visits Requested:   1  . POCT urine pregnancy   No orders of the defined types were placed in this encounter.   Harriet Butte, [place], PGY-3 04/03/2018, 12:05 PM

## 2018-04-03 NOTE — Addendum Note (Signed)
Addended byYisroel Ramming, DAVID J on: 04/03/2018 01:49 PM   Modules accepted: Orders

## 2018-04-03 NOTE — Addendum Note (Signed)
Addended by: Londell Moh T on: 04/03/2018 01:30 PM   Modules accepted: Orders

## 2018-04-03 NOTE — Addendum Note (Signed)
Addended by: Londell Moh T on: 04/03/2018 12:53 PM   Modules accepted: Orders

## 2018-04-03 NOTE — Addendum Note (Signed)
Addended by: Londell Moh T on: 04/03/2018 02:47 PM   Modules accepted: Orders

## 2018-04-03 NOTE — Patient Instructions (Signed)
Thank you for coming in to see [place] today. Please see below to review our plan for today's visit.  I have placed a referral for you guys to see an obstetrician to discuss the pregnancy and your options.  Be sure to take a prenatal vitamin daily in the meantime.  Please call the clinic at [phone] if your symptoms worsen or you have any concerns. It was our pleasure to serve you.  Harriet Butte, [place], PGY-3

## 2018-04-03 NOTE — Progress Notes (Signed)
u

## 2018-04-04 LAB — OBSTETRIC PANEL, INCLUDING HIV
ANTIBODY SCREEN: NEGATIVE
BASOS: 1 %
Basophils Absolute: 0.1 10*3/uL (ref 0.0–0.2)
EOS (ABSOLUTE): 0.5 10*3/uL — ABNORMAL HIGH (ref 0.0–0.4)
EOS: 5 %
HEMATOCRIT: 38.2 % (ref 34.0–46.6)
HEMOGLOBIN: 13 g/dL (ref 11.1–15.9)
HIV Screen 4th Generation wRfx: NONREACTIVE
Hepatitis B Surface Ag: NEGATIVE
IMMATURE GRANS (ABS): 0 10*3/uL (ref 0.0–0.1)
Immature Granulocytes: 0 %
LYMPHS: 25 %
Lymphocytes Absolute: 2.5 10*3/uL (ref 0.7–3.1)
MCH: 29.1 pg (ref 26.6–33.0)
MCHC: 34 g/dL (ref 31.5–35.7)
MCV: 86 fL (ref 79–97)
MONOCYTES: 8 %
Monocytes Absolute: 0.8 10*3/uL (ref 0.1–0.9)
Neutrophils Absolute: 6.1 10*3/uL (ref 1.4–7.0)
Neutrophils: 61 %
Platelets: 270 10*3/uL (ref 150–450)
RBC: 4.46 x10E6/uL (ref 3.77–5.28)
RDW: 12.4 % (ref 12.3–15.4)
RH TYPE: POSITIVE
RPR: NONREACTIVE
RUBELLA: 12.6 {index} (ref >0.99)
WBC: 10 10*3/uL (ref 3.4–10.8)

## 2018-04-05 LAB — CULTURE, OB URINE

## 2018-04-05 LAB — URINE CULTURE, OB REFLEX

## 2018-04-05 LAB — SICKLE CELL SCREEN: Sickle Cell Screen: NEGATIVE

## 2018-04-06 ENCOUNTER — Other Ambulatory Visit: Payer: Self-pay | Admitting: *Deleted

## 2018-04-06 ENCOUNTER — Encounter: Payer: Self-pay | Admitting: Family Medicine

## 2018-04-06 ENCOUNTER — Encounter: Payer: Self-pay | Admitting: *Deleted

## 2018-04-06 DIAGNOSIS — Z1388 Encounter for screening for disorder due to exposure to contaminants: Secondary | ICD-10-CM | POA: Diagnosis not present

## 2018-04-06 MED ORDER — EPINEPHRINE 0.3 MG/0.3ML IJ SOAJ: INTRAMUSCULAR | 2 refills | Status: DC

## 2018-04-06 NOTE — Telephone Encounter (Signed)
Refer to result note 03/31/18

## 2018-04-07 ENCOUNTER — Telehealth: Payer: Self-pay | Admitting: Family Medicine

## 2018-04-07 NOTE — Telephone Encounter (Signed)
Will forward to referral coordinator to see if this is possible. Kinsley Holderman,CMA

## 2018-04-07 NOTE — Telephone Encounter (Signed)
Parent educator:  Mother would like to have her appt at [facility] moved up from August 22. She has real concerns about the pregnancy.  She has requested womens to move up the appt but they cannoit. She was hoping [facility] could assist with this.

## 2018-04-10 NOTE — Telephone Encounter (Signed)
Contacted pt and reassured her all of her labs came back fine. Pt is worried about her BP and GDM, as these were issues in past pregnancy. I informed pt the glucose test was not done yet, as she just had the initial labs drawn. Pt informed she can call and make a nurse visit apt for BP anytime she wants if she is worried. Pt verbalized understanding.

## 2018-04-10 NOTE — Telephone Encounter (Signed)
From quick review it appears all labs are normal. Ucx with mixed urogenital flora but not at significant amount to treat. If she is concerned about blood pressure in this pregnancy she can always come in for nurse BP checks until her appt w/ [facility] for OB care.

## 2018-04-10 NOTE — Telephone Encounter (Signed)
Thank you Page! 

## 2018-04-10 NOTE — Telephone Encounter (Signed)
Would like results from last week blood test

## 2018-04-10 NOTE — Telephone Encounter (Signed)
Dr. Yisroel Ramming ordered these labs, will route to him to call patient to discuss. Thanks.

## 2018-04-14 ENCOUNTER — Other Ambulatory Visit: Payer: Self-pay

## 2018-04-14 ENCOUNTER — Ambulatory Visit (INDEPENDENT_AMBULATORY_CARE_PROVIDER_SITE_OTHER): Payer: Self-pay | Admitting: Family Medicine

## 2018-04-14 DIAGNOSIS — J3089 Other allergic rhinitis: Secondary | ICD-10-CM

## 2018-04-14 DIAGNOSIS — J302 Other seasonal allergic rhinitis: Secondary | ICD-10-CM

## 2018-04-14 MED ORDER — FLUTICASONE PROPIONATE 50 MCG/ACT NA SUSP: 1.0000 | Freq: Every day | NASAL | 2 refills | Status: DC

## 2018-04-14 MED ORDER — CETIRIZINE HCL 10 MG PO TABS: 10.0000 mg | ORAL_TABLET | Freq: Every day | ORAL | 3 refills | Status: DC

## 2018-04-14 MED ORDER — PRENATAL VITAMINS 0.8 MG PO TABS: 1.0000 | ORAL_TABLET | Freq: Every day | ORAL | 6 refills | Status: DC

## 2018-04-14 NOTE — Progress Notes (Signed)
   Subjective:   Patient ID: Cindy Howell    DOB: [birth year], 32 y.o. female   MRN: [id number]  CC: allergies   HPI: Cindy Howell is a 32 y.o. female who presents to clinic today for allergies.   Allergies Patient reports runny nose in the morning for the past x1 month, lots of sneezing.  Using equate brand flonase spray but feels this is not working.   She does not have any cough, congestion, ear pain. She had also been taking Claritin but has stopped taking this ever since she found out she was pregnant.  She took a urine pregnancy test recently and found out she is pregnant.  She is about 8 weeks into her first trimester. No fever, abdominal pain, cramping.  She endorses some nausea but has not vomited. No known allergies to food or medication.  No pets at home. No history of tobacco use.    ROS: See HPI for pertinent ROS.  Social: pt is a never smoker.  Medications reviewed. Objective:   BP 118/78   Pulse 93   Temp 98.6 F (37 C) (Oral)   Ht 5\' 3"  (1.6 m)   Wt 179 lb 3.2 oz (81.3 kg)   LMP 03/24/2018 (Exact Date)   SpO2 99%   BMI 31.74 kg/m  Vitals and nursing note reviewed.  General: 32 yo female, NAD  HEENT: NCAT, EOMI, PERRL, clear rhinorrhea, MMM, oropharynx clear without tonsillar exudate or erythema  Neck: supple, non-tender, normal ROM, no LAD  CV: RRR no MRG  Lungs: CTAB, normal effort  Abdomen: soft, NTND, +bs  Skin: warm, dry, no rash  Extremities: warm and well perfused, normal tone  Neuro: alert, oriented x3, no focal deficits   Assessment & Plan:   Allergic rhinitis Patient seen 2 weeks ago and advised to use Zyrtec and flonase for this.  I have explained to her that her symptoms are 2/2 to seasonal allergies.  She seems a little preoccupied with her symptoms despite adequate reassurance.  -restart zyrtec, have sent rx to pharmacy -recommend continue flonase, demonstrated proper technique to spray; may also use nasal saline spray   Meds ordered this  encounter  Medications  . cetirizine (ZYRTEC) 10 MG tablet    Sig: Take 1 tablet (10 mg total) by mouth daily.    Dispense:  30 tablet    Refill:  3  . fluticasone (FLONASE) 50 MCG/ACT nasal spray    Sig: Place 1 spray into both nostrils daily. 1 spray in each nostril every day    Dispense:  16 g    Refill:  2  . Prenatal Multivit-Min-Fe-FA (PRENATAL VITAMINS) 0.8 MG tablet    Sig: Take 1 tablet by mouth daily.    Dispense:  30 tablet    Refill:  6    Lovenia Kim, MD [place], PGY-3 04/14/2018 5:01 PM

## 2018-04-14 NOTE — Assessment & Plan Note (Signed)
Patient seen 2 weeks ago and advised to use Zyrtec and flonase for this.  I have explained to her that her symptoms are 2/2 to seasonal allergies.  She seems a little preoccupied with her symptoms despite adequate reassurance.  -restart zyrtec, have sent rx to pharmacy -recommend continue flonase, demonstrated proper technique to spray; may also use nasal saline spray

## 2018-04-14 NOTE — Patient Instructions (Addendum)
It was nice seeing you today, you were seen in clinic for runny nose which is most likely due to your seasonal allergies.  I would like you to start using your Flonase nasal spray again as well as restart Zyrtec as both of these will help you with your symptoms.  I have sent these into your pharmacy and you can pick them up.  If your allergy symptoms do not improve over the next several weeks, we may need to try a different medication.  Please call clinic if you have any questions.  Be well, Lovenia Kim MD

## 2018-04-20 ENCOUNTER — Telehealth: Payer: Self-pay | Admitting: *Deleted

## 2018-04-20 NOTE — Telephone Encounter (Signed)
Pt wants to know if it is ok to continue her heartburn medication since she is now pregnant.  Will forward to MD to advise. Rayon Mcchristian, Salome Spotted, CMA

## 2018-04-22 NOTE — Telephone Encounter (Signed)
Pt informed. Mamoru Takeshita Dawn, CMA  

## 2018-04-22 NOTE — Telephone Encounter (Signed)
Yes, ranitidine (zantac) is safe to take for heartburn. I noticed ibuprofen on her medication list. This is NOT safe. No NSAIDs- ibuprofen, motrin, aleve, etc. Please let patient know.

## 2018-04-29 DIAGNOSIS — N925 Other specified irregular menstruation: Secondary | ICD-10-CM | POA: Diagnosis not present

## 2018-04-29 DIAGNOSIS — F418 Other specified anxiety disorders: Secondary | ICD-10-CM | POA: Diagnosis not present

## 2018-04-29 DIAGNOSIS — Z113 Encounter for screening for infections with a predominantly sexual mode of transmission: Secondary | ICD-10-CM | POA: Diagnosis not present

## 2018-04-29 DIAGNOSIS — O3680X9 Pregnancy with inconclusive fetal viability, other fetus: Secondary | ICD-10-CM | POA: Diagnosis not present

## 2018-04-29 DIAGNOSIS — R112 Nausea with vomiting, unspecified: Secondary | ICD-10-CM | POA: Diagnosis not present

## 2018-04-29 DIAGNOSIS — O23599 Infection of other part of genital tract in pregnancy, unspecified trimester: Secondary | ICD-10-CM | POA: Diagnosis not present

## 2018-04-29 DIAGNOSIS — Z3A09 9 weeks gestation of pregnancy: Secondary | ICD-10-CM | POA: Diagnosis not present

## 2018-05-06 ENCOUNTER — Encounter: Payer: Self-pay | Admitting: Obstetrics and Gynecology

## 2018-05-11 DIAGNOSIS — F418 Other specified anxiety disorders: Secondary | ICD-10-CM

## 2018-05-11 DIAGNOSIS — Z8632 Personal history of gestational diabetes: Secondary | ICD-10-CM | POA: Insufficient documentation

## 2018-05-11 HISTORY — DX: Other specified anxiety disorders: F41.8

## 2018-05-18 DIAGNOSIS — Z8759 Personal history of other complications of pregnancy, childbirth and the puerperium: Secondary | ICD-10-CM | POA: Insufficient documentation

## 2018-05-20 DIAGNOSIS — Z3A12 12 weeks gestation of pregnancy: Secondary | ICD-10-CM | POA: Diagnosis not present

## 2018-05-20 DIAGNOSIS — Z3682 Encounter for antenatal screening for nuchal translucency: Secondary | ICD-10-CM | POA: Diagnosis not present

## 2018-05-21 ENCOUNTER — Ambulatory Visit: Payer: Self-pay

## 2018-05-21 DIAGNOSIS — J302 Other seasonal allergic rhinitis: Secondary | ICD-10-CM | POA: Diagnosis not present

## 2018-05-21 DIAGNOSIS — Z3A13 13 weeks gestation of pregnancy: Secondary | ICD-10-CM | POA: Diagnosis not present

## 2018-05-21 DIAGNOSIS — Z3481 Encounter for supervision of other normal pregnancy, first trimester: Secondary | ICD-10-CM | POA: Diagnosis not present

## 2018-05-21 DIAGNOSIS — Z91013 Allergy to seafood: Secondary | ICD-10-CM | POA: Insufficient documentation

## 2018-05-21 DIAGNOSIS — Z8759 Personal history of other complications of pregnancy, childbirth and the puerperium: Secondary | ICD-10-CM | POA: Diagnosis not present

## 2018-05-21 DIAGNOSIS — K219 Gastro-esophageal reflux disease without esophagitis: Secondary | ICD-10-CM | POA: Diagnosis not present

## 2018-05-25 ENCOUNTER — Telehealth: Payer: Self-pay | Admitting: Family Medicine

## 2018-05-25 DIAGNOSIS — J302 Other seasonal allergic rhinitis: Secondary | ICD-10-CM | POA: Diagnosis not present

## 2018-05-25 DIAGNOSIS — L989 Disorder of the skin and subcutaneous tissue, unspecified: Secondary | ICD-10-CM | POA: Diagnosis not present

## 2018-05-25 NOTE — Telephone Encounter (Signed)
Patient called and stated that she is a patient at [facility] and receives care at their office. She wanted to see if it is the same check-up care that she would receive at [facility] and if so why did she need this appt. I canceled are appointments and explained since she is receiving care there she did not have to keep this appt.

## 2018-05-26 ENCOUNTER — Encounter: Payer: Medicaid Other | Admitting: Obstetrics and Gynecology

## 2018-07-10 DIAGNOSIS — Z363 Encounter for antenatal screening for malformations: Secondary | ICD-10-CM | POA: Diagnosis not present

## 2018-07-10 DIAGNOSIS — Z8759 Personal history of other complications of pregnancy, childbirth and the puerperium: Secondary | ICD-10-CM | POA: Diagnosis not present

## 2018-07-10 DIAGNOSIS — Z3A20 20 weeks gestation of pregnancy: Secondary | ICD-10-CM | POA: Diagnosis not present

## 2018-07-22 DIAGNOSIS — Z363 Encounter for antenatal screening for malformations: Secondary | ICD-10-CM | POA: Diagnosis not present

## 2018-07-22 DIAGNOSIS — Z3A21 21 weeks gestation of pregnancy: Secondary | ICD-10-CM | POA: Diagnosis not present

## 2018-07-29 NOTE — Progress Notes (Signed)
  Subjective:   Patient ID: Cindy Howell    DOB: 1986/04/10, 32 y.o. female   MRN: [id number]  Cindy Howell is a G1P0 currently pregnant 32 y.o. female with a history of allergies here for   Cough Currently G1P0 at 23 weeks followed by [facility].  Patient states since the beginning of her pregnancy she has had difficulty with cough and allergies.  She was started on Zyrtec with some relief initially, however she has been having increased runny nose, sneezing, nightly cough for the past 10 days.  Sometimes will cough up clear mucus.  She denies any known sick contacts.  She reports compliance with taking Zyrtec every day.  She has not taken any over-the-counter medications for cough or congestion.  Denies fevers, chest pain.  Does report some shortness of breath after coughing.  She is eating and drinking normally.  She denies any contractions or vaginal bleeding.  Review of Systems:  Per HPI.  [place], medications and smoking status reviewed.  Objective:   BP 114/82   Pulse (!) 105   Temp 98.3 F (36.8 C) (Oral)   Ht 5\' 3"  (1.6 m)   Wt 179 lb 9.6 oz (81.5 kg)   LMP 03/24/2018 (Exact Date)   SpO2 99%   BMI 31.81 kg/m  Vitals and nursing note reviewed.  General: well nourished, well developed, in no acute distress with non-toxic appearance HEENT: normocephalic, atraumatic, moist mucous membranes.  Bilateral TMs visible without erythema, purulence, bulging.  Oropharynx clear.  Boggy, inflamed nasal turbinates. Neck: supple, non-tender without lymphadenopathy CV: regular rate and rhythm without murmurs, rubs, or gallops Lungs: clear to auscultation bilaterally with normal work of breathing Skin: warm, dry, no rashes or lesions Extremities: warm and well perfused, normal tone MSK: ROM grossly intact, strength intact, gait normal Neuro: Alert and oriented, speech normal  Assessment & Plan:   Allergic rhinitis Chronic with recent worsening.  Advised continuation of daily  Zyrtec, refilled Flonase prescription.  Provided list of over-the-counter cough medications safe for use during pregnancy.  Red flags reviewed.  Follow-up with OB as scheduled.  No orders of the defined types were placed in this encounter.  Meds ordered this encounter  Medications  . fluticasone (FLONASE) 50 MCG/ACT nasal spray    Sig: Place 1 spray into both nostrils daily. 1 spray in each nostril every day    Dispense:  16 g    Refill:  [place], DO PGY-2, [facility] 07/30/2018 3:31 PM

## 2018-07-30 ENCOUNTER — Ambulatory Visit (INDEPENDENT_AMBULATORY_CARE_PROVIDER_SITE_OTHER): Payer: Medicaid Other | Admitting: Family Medicine

## 2018-07-30 ENCOUNTER — Other Ambulatory Visit: Payer: Self-pay

## 2018-07-30 VITALS — BP 114/82 | HR 105 | Temp 98.3°F | Ht 63.0 in | Wt 179.6 lb

## 2018-07-30 DIAGNOSIS — J3089 Other allergic rhinitis: Secondary | ICD-10-CM

## 2018-07-30 DIAGNOSIS — J302 Other seasonal allergic rhinitis: Secondary | ICD-10-CM

## 2018-07-30 MED ORDER — FLUTICASONE PROPIONATE 50 MCG/ACT NA SUSP: 1.0000 | Freq: Every day | NASAL | 2 refills | Status: DC

## 2018-07-30 NOTE — Assessment & Plan Note (Signed)
Chronic with recent worsening.  Advised continuation of daily Zyrtec, refilled Flonase prescription.  Provided list of over-the-counter cough medications safe for use during pregnancy.  Red flags reviewed.  Follow-up with OB as scheduled.

## 2018-07-30 NOTE — Patient Instructions (Signed)
It was great to see you!  Our plans for today:  - We are refilling your Flonase. - You can take over the counter robatussin DM (alcohol free, plain only), cough drops and chloraseptic spray for your cough. - If you develop difficulty breathing, you should be seen. - If you have contractions, vaginal bleeding, go to [facility]  Take care and seek immediate care sooner if you develop any concerns.   Dr. [facility]  SAFE MEDICATIONS IN PREGNANCY  Acne:  Benzoyl Peroxide  Salicylic Acid   Backache/Headache:  Tylenol: 2 regular strength every 4 hours OR        2 Extra strength every 6 hours   Colds/Coughs/Allergies:  Benadryl (alcohol free) 25 mg every 6 hours as needed  Breath right strips  Claritin  Cepacol throat lozenges  Chloraseptic throat spray  Cold-Eeze- up to three times per day  Cough drops, alcohol free  Flonase (by prescription only)  Guaifenesin  Mucinex  Robitussin DM (plain only, alcohol free)  Saline nasal spray/drops  Sudafed (pseudoephedrine) & Actifed * use only after [redacted] weeks gestation and if you do not have high blood pressure  Tylenol  Vicks Vaporub  Zinc lozenges  Zyrtec   Constipation:  Colace  Ducolax suppositories  Fleet enema  Glycerin suppositories  Metamucil  Milk of magnesia  Miralax  Senokot  Smooth move tea   Diarrhea:  Kaopectate  Imodium A-D   *NO pepto Bismol   Hemorrhoids:  Anusol  Anusol HC  Preparation H  Tucks   Indigestion:  Tums  Maalox  Mylanta  Zantac  Pepcid   Insomnia:  Benadryl (alcohol free) 25mg  every 6 hours as needed  Tylenol PM  Unisom, no Gelcaps   Leg Cramps:  Tums  MagGel   Nausea/Vomiting:  Bonine  Dramamine  Emetrol  Ginger extract  Sea bands  Meclizine  Nausea medication to take during pregnancy:  Unisom (doxylamine succinate 25 mg tablets) Take one tablet daily at bedtime. If symptoms are not adequately controlled, the dose can be increased to  a maximum recommended dose of two tablets daily (1/2 tablet in the morning, 1/2 tablet mid-afternoon and one at bedtime).  Vitamin B6 100mg  tablets. Take one tablet twice a day (up to 200 mg per day).   Skin Rashes:  Aveeno products  Benadryl cream or 25mg  every 6 hours as needed  Calamine Lotion  1% cortisone cream   Yeast infection:  Gyne-lotrimin 7  Monistat 7    **If taking multiple medications, please check labels to avoid duplicating the same active ingredients  **take medication as directed on the label  ** Do not exceed 4000 mg of tylenol in 24 hours  **Do not take medications that contain aspirin or ibuprofen

## 2018-08-24 ENCOUNTER — Telehealth: Payer: Self-pay

## 2018-08-24 NOTE — Telephone Encounter (Signed)
Pt left VM on nurse line, I returned her call, no answer or option for VM.

## 2018-08-31 DIAGNOSIS — Z23 Encounter for immunization: Secondary | ICD-10-CM | POA: Diagnosis not present

## 2018-08-31 DIAGNOSIS — Z3A27 27 weeks gestation of pregnancy: Secondary | ICD-10-CM | POA: Diagnosis not present

## 2018-08-31 DIAGNOSIS — Z362 Encounter for other antenatal screening follow-up: Secondary | ICD-10-CM | POA: Diagnosis not present

## 2018-09-02 DIAGNOSIS — O99810 Abnormal glucose complicating pregnancy: Secondary | ICD-10-CM | POA: Diagnosis not present

## 2018-09-03 DIAGNOSIS — F4321 Adjustment disorder with depressed mood: Secondary | ICD-10-CM | POA: Diagnosis not present

## 2018-09-08 ENCOUNTER — Encounter: Payer: Self-pay | Admitting: Registered"

## 2018-09-08 ENCOUNTER — Encounter: Payer: Medicaid Other | Attending: Obstetrics and Gynecology | Admitting: Registered"

## 2018-09-08 DIAGNOSIS — Z713 Dietary counseling and surveillance: Secondary | ICD-10-CM | POA: Diagnosis not present

## 2018-09-08 DIAGNOSIS — O24419 Gestational diabetes mellitus in pregnancy, unspecified control: Secondary | ICD-10-CM | POA: Diagnosis not present

## 2018-09-08 DIAGNOSIS — O99810 Abnormal glucose complicating pregnancy: Secondary | ICD-10-CM

## 2018-09-08 DIAGNOSIS — Z3A00 Weeks of gestation of pregnancy not specified: Secondary | ICD-10-CM | POA: Insufficient documentation

## 2018-09-08 NOTE — Progress Notes (Signed)
Patient was seen on 09/08/18 for Gestational Diabetes self-management education at [facility]. The following learning objectives were met by the patient during this course:   States the definition of Gestational Diabetes  States why dietary management is important in controlling blood glucose  Describes the effects each nutrient has on blood glucose levels  Demonstrates ability to create a balanced meal plan  Demonstrates carbohydrate counting   States when to check blood glucose levels  Demonstrates proper blood glucose monitoring techniques  States the effect of stress and exercise on blood glucose levels  States the importance of limiting caffeine and abstaining from alcohol and smoking  Blood glucose monitor given: Accu-chek Guide Me Lot # [id number] Exp: 10/14/19 Blood glucose reading: 97 mg/dL  Patient instructed to monitor glucose levels: FBS: 60 - <95; 1 hour: <140; 2 hour: <120  Patient received handouts:  Nutrition Diabetes and Pregnancy, including carb counting list  Patient will be seen for follow-up as needed.

## 2018-09-14 DIAGNOSIS — F4321 Adjustment disorder with depressed mood: Secondary | ICD-10-CM | POA: Diagnosis not present

## 2018-09-16 NOTE — L&D Delivery Note (Signed)
Delivery Note At 1:16 PM a  viable female was delivered via Vaginal, Spontaneous (Presentation: LOA).  APGAR: official pending, 8,9 by my estimation, Weight pending .   Placenta status: small blood clot at one edge, thin, 5 x 6 cm spread, suspect placenta abruption, placenta otherwise normal.  Cord: normal appearing with 3 vessel cord.  Nuchal cord reduced.   Proceeded with pushing after more blood clots noted and fetus having some variable decelerations with contractions.   Anesthesia: Epidural. Episiotomy: None Lacerations: None Suture Repair: None Est. Blood Loss (mL): 75 cc.   Mom to postpartum.  Baby to Couplet care / Skin to Skin.  Alinda Dooms, MD.  10/31/2018, 1:51 PM

## 2018-09-21 DIAGNOSIS — F4321 Adjustment disorder with depressed mood: Secondary | ICD-10-CM | POA: Diagnosis not present

## 2018-09-23 DIAGNOSIS — Z3A30 30 weeks gestation of pregnancy: Secondary | ICD-10-CM | POA: Diagnosis not present

## 2018-09-23 DIAGNOSIS — O24419 Gestational diabetes mellitus in pregnancy, unspecified control: Secondary | ICD-10-CM | POA: Diagnosis not present

## 2018-09-24 ENCOUNTER — Ambulatory Visit (INDEPENDENT_AMBULATORY_CARE_PROVIDER_SITE_OTHER): Payer: Medicaid Other | Admitting: Family Medicine

## 2018-09-24 ENCOUNTER — Other Ambulatory Visit (HOSPITAL_COMMUNITY): Payer: Self-pay | Admitting: Obstetrics and Gynecology

## 2018-09-24 ENCOUNTER — Other Ambulatory Visit: Payer: Self-pay

## 2018-09-24 VITALS — BP 98/68 | HR 89 | Temp 97.7°F | Ht 63.0 in | Wt 180.8 lb

## 2018-09-24 DIAGNOSIS — L811 Chloasma: Secondary | ICD-10-CM | POA: Insufficient documentation

## 2018-09-24 DIAGNOSIS — L700 Acne vulgaris: Secondary | ICD-10-CM

## 2018-09-24 DIAGNOSIS — Z3A32 32 weeks gestation of pregnancy: Secondary | ICD-10-CM

## 2018-09-24 DIAGNOSIS — O24419 Gestational diabetes mellitus in pregnancy, unspecified control: Secondary | ICD-10-CM

## 2018-09-24 NOTE — Assessment & Plan Note (Signed)
  Discussed benign nature of skin darkening, advised using daily topical sunscreen with at least SPF 50 to prevent worsening. Her case is extremely mild. If it worsens could consider topical hydroquinone for lightening but no safety data available in pregnancy. Follow up as needed

## 2018-09-24 NOTE — Assessment & Plan Note (Signed)
  Likely worsened due to hormone changes with pregnancy. Advised OTC benzoyl peroxide face wash. Follow up as needed

## 2018-09-24 NOTE — Patient Instructions (Addendum)
   Please get a face wash with benzoyl peroxide to help with the bumps.  Please use the daily face lotion with sunscreen of SPF 50  Use hydrocortisone cream for any itching.  If you have questions or concerns please do not hesitate to call at [phone].  Lucila Maine, DO PGY-3, [facility] 09/24/2018 3:53 PM

## 2018-09-24 NOTE — Progress Notes (Signed)
    Subjective:    Patient ID: Cindy Howell, female    DOB: [birth year], 33 y.o.   MRN: [id number]   CC: rash on face  HPI: Patient is [redacted] weeks pregnant with concerns over two facial issues. She reports darker spots on sides of face that started since she has gotten pregnant.  Also has increase in acne on her cheeks. These do not hurt, she does not express material from them. They occasionally itch. She uses aveeno oatmeal face wash and then puts on moisturizer daily. She has not changed products. She is unhappy with the appearance of these issues. States she did not have these issues with her prior pregnancies.   Smoking status reviewed- non-smoker  Review of Systems- see HPI   Objective:  BP 98/68   Pulse 89   Temp 97.7 F (36.5 C) (Oral)   Ht 5\' 3"  (1.6 m)   Wt 180 lb 12.8 oz (82 kg)   LMP 03/24/2018 (Exact Date)   SpO2 99%   BMI 32.03 kg/m  Vitals and nursing note reviewed  General: well nourished, in no acute distress HEENT: normocephalic, MMM Skin: warm and dry. Scattered closed comedones over cheeks bilaterally. Mildly hyperpigmented patches on lateral mandibles bilaterally Neuro: alert and oriented, no focal deficits   Assessment & Plan:    Comedonal acne  Likely worsened due to hormone changes with pregnancy. Advised OTC benzoyl peroxide face wash. Follow up as needed   Melasma  Discussed benign nature of skin darkening, advised using daily topical sunscreen with at least SPF 50 to prevent worsening. Her case is extremely mild. If it worsens could consider topical hydroquinone for lightening but no safety data available in pregnancy. Follow up as needed     Return as needed.   Lucila Maine, DO Family Medicine Resident PGY-3

## 2018-09-29 ENCOUNTER — Ambulatory Visit (HOSPITAL_COMMUNITY)
Admission: RE | Admit: 2018-09-29 | Discharge: 2018-09-29 | Disposition: A | Discharge status: Home / Self Care | Payer: Medicaid Other | Source: Ambulatory Visit | Attending: Obstetrics and Gynecology | Admitting: Obstetrics and Gynecology

## 2018-09-29 DIAGNOSIS — O24419 Gestational diabetes mellitus in pregnancy, unspecified control: Secondary | ICD-10-CM | POA: Insufficient documentation

## 2018-09-29 DIAGNOSIS — Z3A32 32 weeks gestation of pregnancy: Secondary | ICD-10-CM | POA: Diagnosis not present

## 2018-09-29 DIAGNOSIS — Z3A31 31 weeks gestation of pregnancy: Secondary | ICD-10-CM

## 2018-09-30 ENCOUNTER — Ambulatory Visit (HOSPITAL_COMMUNITY): Payer: Medicaid Other

## 2018-10-01 DIAGNOSIS — F4321 Adjustment disorder with depressed mood: Secondary | ICD-10-CM | POA: Diagnosis not present

## 2018-10-02 ENCOUNTER — Other Ambulatory Visit: Payer: Self-pay

## 2018-10-02 ENCOUNTER — Inpatient Hospital Stay (HOSPITAL_COMMUNITY)
Admission: AD | Admit: 2018-10-02 | Discharge: 2018-10-02 | Disposition: A | Discharge status: Home / Self Care | Payer: Medicaid Other | Source: Ambulatory Visit | Attending: Obstetrics and Gynecology | Admitting: Obstetrics and Gynecology

## 2018-10-02 ENCOUNTER — Encounter (HOSPITAL_COMMUNITY): Payer: Self-pay | Admitting: *Deleted

## 2018-10-02 DIAGNOSIS — O24410 Gestational diabetes mellitus in pregnancy, diet controlled: Secondary | ICD-10-CM | POA: Insufficient documentation

## 2018-10-02 DIAGNOSIS — O24419 Gestational diabetes mellitus in pregnancy, unspecified control: Secondary | ICD-10-CM | POA: Diagnosis not present

## 2018-10-02 DIAGNOSIS — Z3A32 32 weeks gestation of pregnancy: Secondary | ICD-10-CM | POA: Diagnosis not present

## 2018-10-02 DIAGNOSIS — Z3689 Encounter for other specified antenatal screening: Secondary | ICD-10-CM

## 2018-10-02 DIAGNOSIS — O368330 Maternal care for abnormalities of the fetal heart rate or rhythm, third trimester, not applicable or unspecified: Secondary | ICD-10-CM | POA: Diagnosis not present

## 2018-10-02 NOTE — MAU Note (Signed)
Pt presents to MAU from physicians office for fetal monitoring for fetal tachycardia. Denies any VB or LOF

## 2018-10-02 NOTE — MAU Provider Note (Signed)
Chief Complaint:  fetal monitoring   None    HPI: Cindy Howell is a 33 y.o. G3P2002 at [redacted]w[redacted]d who presents to maternity admissions reporting from office due to fetal tachycardia noted at visit in office  Pt denies bleeding or discharge. FM+. Was diagnsised with gestational diabetes in pregnancy. States did not drink or eat enough before ob visit.  Currently hydrating with water. Denies contractions, leakage of fluid or vaginal bleeding. Good fetal movement.   Pregnancy Course:   Past Medical History:  Diagnosis Date  . Urticaria    OB History  Gravida Para Term Preterm AB Living  3 2 2     2   SAB TAB Ectopic Multiple Live Births          2    # Outcome Date GA Lbr Len/2nd Weight Sex Delivery Anes PTL Lv  3 Current           2 Term 08/27/17 [redacted]w[redacted]d   M Vag-Spont   LIV  1 Term 07/26/05    M Vag-Spont   LIV   History reviewed. No pertinent surgical history. History reviewed. No pertinent family history. Social History   Tobacco Use  . Smoking status: Never Smoker  . Smokeless tobacco: Never Used  Substance Use Topics  . Alcohol use: Never    Frequency: Never  . Drug use: Never   Allergies  Allergen Reactions  . Beef-Derived Products Rash  . Goat-Derived Products Rash  . Shrimp [Shellfish Allergy] Rash   Medications Prior to Admission  Medication Sig Dispense Refill Last Dose  . ammonium lactate (AMLACTIN) 12 % cream Apply topically 2 (two) times daily as needed for dry skin. (Patient not taking: Reported on 04/03/2018) 385 g 1 Not Taking  . azelastine (ASTELIN) 0.1 % nasal spray Place 2 sprays into both nostrils 2 (two) times daily. (Patient not taking: Reported on 04/03/2018) 30 mL 1 Not Taking  . cetirizine (ZYRTEC) 10 MG tablet Take 1 tablet (10 mg total) by mouth daily. 30 tablet 3   . clindamycin-benzoyl peroxide (BENZACLIN) gel Apply topically 2 (two) times daily. (Patient not taking: Reported on 04/03/2018) 35 g 0 Not Taking  . EPINEPHrine (EPIPEN 2-PAK) 0.3 mg/0.3 mL  IJ SOAJ injection Use as directed for severe allergic reactions 2 Device 2   . fluticasone (FLONASE) 50 MCG/ACT nasal spray Place 1 spray into both nostrils daily. 1 spray in each nostril every day 16 g 2   . levocetirizine (XYZAL) 5 MG tablet Take 1 tablet (5 mg total) by mouth every evening. (Patient not taking: Reported on 04/03/2018) 30 tablet 1 Not Taking  . loratadine (CLARITIN REDITABS) 10 MG dissolvable tablet Take 1 tablet (10 mg total) by mouth daily. As needed for allergy symptoms 31 tablet 12 Taking  . Prenatal Multivit-Min-Fe-FA (PRENATAL VITAMINS) 0.8 MG tablet Take 1 tablet by mouth daily. 30 tablet 6   . ranitidine (ZANTAC) 150 MG tablet Take 1 tablet (150 mg total) by mouth 2 (two) times daily as needed for heartburn. (Patient not taking: Reported on 04/03/2018) 30 tablet 3 Not Taking  . triamcinolone ointment (KENALOG) 0.1 % Apply 1 application topically 2 (two) times daily. (Patient not taking: Reported on 04/03/2018) 80 g 0 Not Taking    I have reviewed patient's Past Medical Hx, Surgical Hx, Family Hx, Social Hx, medications and allergies.   ROS:  Review of Systems  All other systems reviewed and are negative.   Physical Exam   Patient Vitals for the past 24 hrs:  BP Temp Pulse Resp SpO2  10/02/18 1517 136/83 (!) 97.3 F (36.3 C) (!) 108 16 99 %   Constitutional: Well-developed, well-nourished female in no acute distress.  Cardiovascular: normal rate Respiratory: normal effort GI: Abd soft, non-tender, gravid appropriate for gestational age. Pos BS x 4 MS: Extremities nontender, no edema, normal ROM Neurologic: Alert and oriented x 4.   FHT:  Baseline 150 , moderate variability, accelerations present, no decelerations Contractions: none   Labs: No results found for this or any previous visit (from the past 24 hour(s)).  Imaging:  [place] Mfm Fetal Bpp Wo Non Stress  Result Date: 09/29/2018 ----------------------------------------------------------------------   OBSTETRICS REPORT                       (Signed Final 09/29/2018 03:52 pm) ---------------------------------------------------------------------- Patient Info  ID #:       [id number]                          D.O.B.:  1986/09/11 (32 yrs)  Name:       Cindy Howell                   Visit Date: 09/29/2018 01:46 pm ---------------------------------------------------------------------- Performed By  Performed By:     Hubert Azure          Ref. Address:      [facility]                                                              Obstetrics &                                                              Gynecology                                                              [place] [phone]  [place], [place]  Attending:        Sander Nephew      Location:          [facility]                    MD  Referred By:      Everett Graff MD ---------------------------------------------------------------------- Orders   #  Description                           Code        Ordered By   1  [place] MFM FETAL BPP WO NON               09628.36    [place]  ----------------------------------------------------------------------   #  Order #                     Accession #                Episode #   1  [id number]                   [id number]                 [id number]  ---------------------------------------------------------------------- Indications   Gestational diabetes in pregnancy, diet         O24.410   controlled   [redacted] weeks gestation of pregnancy                 Z3A.31  ---------------------------------------------------------------------- Vital Signs  Weight       180                               Height:         5'3"  (lb):  BMI:         31.88 ---------------------------------------------------------------------- Fetal Evaluation  Num Of Fetuses:          1  Fetal Heart              140  Rate(bpm):  Cardiac Activity:        Observed  Presentation:            Cephalic  Placenta:                Anterior  P. Cord Insertion:       Visualized, central  Amniotic Fluid  AFI FV:      Within normal limits  AFI Sum(cm)     %Tile       Largest Pocket(cm)  16.17           58          5.61  RUQ(cm)       RLQ(cm)       LUQ(cm)        LLQ(cm)  5.61  3.8           3.75           3.01  Comment:    Diaphragm, stomach, and bladder noted. ---------------------------------------------------------------------- Biophysical Evaluation  Amniotic F.V:   Within normal limits       F. Tone:         Observed  F. Movement:    Observed                   Score:           8/8  F. Breathing:   Observed ---------------------------------------------------------------------- Gestational Age  LMP:           31w 5d        Date:  02/19/18                 EDD:    11/26/18  Best:          31w 5d     Det. By:  LMP  (02/19/18)          EDD:    11/26/18 ---------------------------------------------------------------------- Impression  Biophysical profile 8/8 ---------------------------------------------------------------------- Recommendations  Follow up as clinically indicated. ----------------------------------------------------------------------               Sander Nephew, MD Electronically Signed Final Report   09/29/2018 03:52 pm ----------------------------------------------------------------------   MAU Course: Orders Placed This Encounter  Procedures  . Discharge patient Discharge disposition: 01-Home or Self Care; Discharge patient date: 10/02/2018   No orders of the defined types were placed in this encounter.   MDM:NST reactive  Assessment: 1. NST (non-stress test) reactive     Plan: Discharge home in stable condition.    Labor precautions and fetal kick counts Follow-up [facility] Follow up in 2 week(s).   Specialty:  Obstetrics and Gynecology Contact information: [address] [id number] [phone]          Allergies as of 10/02/2018      Reactions   Beef-derived Products Rash   Goat-derived Products Rash   Shrimp [shellfish Allergy] Rash      Medication List    STOP taking these medications   ammonium lactate 12 % cream Commonly known as:  AMLACTIN   azelastine 0.1 % nasal spray Commonly known as:  ASTELIN   clindamycin-benzoyl peroxide gel Commonly known as:  BENZACLIN   levocetirizine 5 MG tablet Commonly known as:  XYZAL   triamcinolone ointment 0.1 % Commonly known as:  KENALOG     TAKE these medications   cetirizine 10 MG tablet Commonly known as:  ZYRTEC Take 1 tablet (10 mg total) by mouth daily.   EPINEPHrine 0.3 mg/0.3 mL Soaj injection Commonly known as:  EPIPEN 2-PAK Use as directed for severe allergic reactions   fluticasone 50 MCG/ACT nasal spray Commonly known as:  FLONASE Place 1 spray into both nostrils daily. 1 spray in each nostril every day   loratadine 10 MG dissolvable tablet Commonly known as:  CLARITIN REDITABS Take 1 tablet (10 mg total) by mouth daily. As needed for allergy symptoms   Prenatal Vitamins 0.8 MG tablet Take 1 tablet by mouth daily.   ranitidine 150 MG tablet Commonly known as:  ZANTAC Take 1 tablet (150 mg total) by mouth 2 (two) times daily as needed for heartburn.       Starla Link, CNM 10/02/2018 4:07 PM

## 2018-10-08 DIAGNOSIS — Z3A33 33 weeks gestation of pregnancy: Secondary | ICD-10-CM | POA: Diagnosis not present

## 2018-10-08 DIAGNOSIS — O24419 Gestational diabetes mellitus in pregnancy, unspecified control: Secondary | ICD-10-CM | POA: Diagnosis not present

## 2018-10-09 DIAGNOSIS — Z3493 Encounter for supervision of normal pregnancy, unspecified, third trimester: Secondary | ICD-10-CM | POA: Diagnosis not present

## 2018-10-09 DIAGNOSIS — Z3A33 33 weeks gestation of pregnancy: Secondary | ICD-10-CM | POA: Diagnosis not present

## 2018-10-09 DIAGNOSIS — N898 Other specified noninflammatory disorders of vagina: Secondary | ICD-10-CM | POA: Diagnosis not present

## 2018-10-13 DIAGNOSIS — Z3492 Encounter for supervision of normal pregnancy, unspecified, second trimester: Secondary | ICD-10-CM | POA: Diagnosis not present

## 2018-10-13 DIAGNOSIS — Z3493 Encounter for supervision of normal pregnancy, unspecified, third trimester: Secondary | ICD-10-CM | POA: Diagnosis not present

## 2018-10-13 DIAGNOSIS — Z3A33 33 weeks gestation of pregnancy: Secondary | ICD-10-CM | POA: Diagnosis not present

## 2018-10-13 DIAGNOSIS — O24419 Gestational diabetes mellitus in pregnancy, unspecified control: Secondary | ICD-10-CM | POA: Diagnosis not present

## 2018-10-16 DIAGNOSIS — F4321 Adjustment disorder with depressed mood: Secondary | ICD-10-CM | POA: Diagnosis not present

## 2018-10-20 DIAGNOSIS — Z3A34 34 weeks gestation of pregnancy: Secondary | ICD-10-CM | POA: Diagnosis not present

## 2018-10-20 DIAGNOSIS — O24419 Gestational diabetes mellitus in pregnancy, unspecified control: Secondary | ICD-10-CM | POA: Diagnosis not present

## 2018-10-27 DIAGNOSIS — Z3A35 35 weeks gestation of pregnancy: Secondary | ICD-10-CM | POA: Diagnosis not present

## 2018-10-27 DIAGNOSIS — Z362 Encounter for other antenatal screening follow-up: Secondary | ICD-10-CM | POA: Diagnosis not present

## 2018-10-27 DIAGNOSIS — O24419 Gestational diabetes mellitus in pregnancy, unspecified control: Secondary | ICD-10-CM | POA: Diagnosis not present

## 2018-10-27 LAB — OB RESULTS CONSOLE GBS: GBS: POSITIVE

## 2018-10-30 ENCOUNTER — Inpatient Hospital Stay (HOSPITAL_COMMUNITY)
Admission: AD | Admit: 2018-10-30 | Discharge: 2018-11-02 | DRG: 805 | Disposition: A | Discharge status: Home / Self Care | Payer: Medicaid Other | Attending: Obstetrics & Gynecology | Admitting: Obstetrics & Gynecology

## 2018-10-30 ENCOUNTER — Encounter (HOSPITAL_COMMUNITY): Payer: Self-pay | Admitting: *Deleted

## 2018-10-30 DIAGNOSIS — O24415 Gestational diabetes mellitus in pregnancy, controlled by oral hypoglycemic drugs: Secondary | ICD-10-CM

## 2018-10-30 DIAGNOSIS — O09299 Supervision of pregnancy with other poor reproductive or obstetric history, unspecified trimester: Secondary | ICD-10-CM

## 2018-10-30 DIAGNOSIS — O4593 Premature separation of placenta, unspecified, third trimester: Secondary | ICD-10-CM | POA: Diagnosis not present

## 2018-10-30 DIAGNOSIS — O99824 Streptococcus B carrier state complicating childbirth: Secondary | ICD-10-CM | POA: Diagnosis present

## 2018-10-30 DIAGNOSIS — Z3A36 36 weeks gestation of pregnancy: Secondary | ICD-10-CM | POA: Diagnosis not present

## 2018-10-30 DIAGNOSIS — Z3A00 Weeks of gestation of pregnancy not specified: Secondary | ICD-10-CM | POA: Diagnosis not present

## 2018-10-30 DIAGNOSIS — O24425 Gestational diabetes mellitus in childbirth, controlled by oral hypoglycemic drugs: Secondary | ICD-10-CM | POA: Diagnosis present

## 2018-10-30 HISTORY — DX: Supervision of pregnancy with other poor reproductive or obstetric history, unspecified trimester: O09.299

## 2018-10-30 HISTORY — DX: Gestational diabetes mellitus in pregnancy, controlled by oral hypoglycemic drugs: O24.415

## 2018-10-30 HISTORY — DX: Gestational diabetes mellitus in pregnancy, unspecified control: O24.419

## 2018-10-30 LAB — CBC
HCT: 36.2 % (ref 36.0–46.0)
HEMOGLOBIN: 12.2 g/dL (ref 12.0–15.0)
MCH: 30.6 pg (ref 26.0–34.0)
MCHC: 33.7 g/dL (ref 30.0–36.0)
MCV: 90.7 fL (ref 80.0–100.0)
Platelets: 214 10*3/uL (ref 150–400)
RBC: 3.99 MIL/uL (ref 3.87–5.11)
RDW: 13.2 % (ref 11.5–15.5)
WBC: 10.9 10*3/uL — ABNORMAL HIGH (ref 4.0–10.5)
nRBC: 0 % (ref 0.0–0.2)

## 2018-10-30 LAB — URINALYSIS, ROUTINE W REFLEX MICROSCOPIC
Bilirubin Urine: NEGATIVE
Glucose, UA: NEGATIVE mg/dL
KETONES UR: NEGATIVE mg/dL
NITRITE: NEGATIVE
PH: 7 (ref 5.0–8.0)
PROTEIN: NEGATIVE mg/dL
Specific Gravity, Urine: <1.005 — ABNORMAL LOW (ref 1.005–1.030)

## 2018-10-30 LAB — GLUCOSE, CAPILLARY
Glucose-Capillary: 76 mg/dL (ref 70–99)
Glucose-Capillary: 71 mg/dL (ref 70–99)

## 2018-10-30 LAB — URINALYSIS, MICROSCOPIC (REFLEX)

## 2018-10-30 LAB — TYPE AND SCREEN
ABO/RH(D): B POS
ANTIBODY SCREEN: NEGATIVE

## 2018-10-30 MED ORDER — OXYTOCIN 40 UNITS IN NORMAL SALINE INFUSION - SIMPLE MED
2.5000 [IU]/h | INTRAVENOUS | Status: DC
  Filled 2018-10-30: qty 1000

## 2018-10-30 MED ORDER — INSULIN ASPART 100 UNIT/ML ~~LOC~~ SOLN: 0.0000 [IU] | Freq: Three times a day (TID) | SUBCUTANEOUS | Status: DC

## 2018-10-30 MED ORDER — LACTATED RINGERS IV SOLN: 500.0000 mL | INTRAVENOUS | Status: DC | PRN

## 2018-10-30 MED ORDER — LIDOCAINE HCL (PF) 1 % IJ SOLN
30.0000 mL | INTRAMUSCULAR | Status: DC | PRN
  Filled 2018-10-30: qty 30

## 2018-10-30 MED ORDER — LACTATED RINGERS IV BOLUS
1000.0000 mL | Freq: Once | INTRAVENOUS | Status: AC
  Administered 2018-10-30: 1000 mL via INTRAVENOUS

## 2018-10-30 MED ORDER — SOD CITRATE-CITRIC ACID 500-334 MG/5ML PO SOLN: 30.0000 mL | ORAL | Status: DC | PRN

## 2018-10-30 MED ORDER — OXYTOCIN BOLUS FROM INFUSION: 500.0000 mL | Freq: Once | INTRAVENOUS | Status: DC

## 2018-10-30 MED ORDER — LACTATED RINGERS IV SOLN
500.0000 mL | Freq: Once | INTRAVENOUS | Status: AC
  Administered 2018-10-31: 500 mL via INTRAVENOUS

## 2018-10-30 MED ORDER — FENTANYL 2.5 MCG/ML BUPIVACAINE 1/10 % EPIDURAL INFUSION (WH - ANES)
14.0000 mL/h | INTRAMUSCULAR | Status: DC | PRN
  Administered 2018-10-31: 14 mL/h via EPIDURAL
  Filled 2018-10-30: qty 100

## 2018-10-30 MED ORDER — SODIUM CHLORIDE 0.9 % IV SOLN
5.0000 10*6.[IU] | Freq: Once | INTRAVENOUS | Status: AC
  Administered 2018-10-30: 5 10*6.[IU] via INTRAVENOUS
  Filled 2018-10-30: qty 5

## 2018-10-30 MED ORDER — OXYCODONE-ACETAMINOPHEN 5-325 MG PO TABS: 2.0000 | ORAL_TABLET | ORAL | Status: DC | PRN

## 2018-10-30 MED ORDER — BETAMETHASONE SOD PHOS & ACET 6 (3-3) MG/ML IJ SUSP
12.0000 mg | INTRAMUSCULAR | Status: DC
  Administered 2018-10-30: 12 mg via INTRAMUSCULAR
  Filled 2018-10-30 (×3): qty 2

## 2018-10-30 MED ORDER — PHENYLEPHRINE 40 MCG/ML (10ML) SYRINGE FOR IV PUSH (FOR BLOOD PRESSURE SUPPORT)
80.0000 ug | PREFILLED_SYRINGE | INTRAVENOUS | Status: DC | PRN
  Filled 2018-10-30: qty 10

## 2018-10-30 MED ORDER — OXYCODONE-ACETAMINOPHEN 5-325 MG PO TABS: 1.0000 | ORAL_TABLET | ORAL | Status: DC | PRN

## 2018-10-30 MED ORDER — FENTANYL CITRATE (PF) 100 MCG/2ML IJ SOLN
50.0000 ug | INTRAMUSCULAR | Status: DC | PRN
  Filled 2018-10-30: qty 2

## 2018-10-30 MED ORDER — DIPHENHYDRAMINE HCL 50 MG/ML IJ SOLN
12.5000 mg | INTRAMUSCULAR | Status: DC | PRN
  Administered 2018-10-31: 12.5 mg via INTRAVENOUS
  Filled 2018-10-30: qty 1

## 2018-10-30 MED ORDER — LACTATED RINGERS IV SOLN
INTRAVENOUS | Status: DC
  Administered 2018-10-30 – 2018-10-31 (×2): via INTRAVENOUS

## 2018-10-30 MED ORDER — EPHEDRINE 5 MG/ML INJ
10.0000 mg | INTRAVENOUS | Status: DC | PRN
  Filled 2018-10-30: qty 2

## 2018-10-30 MED ORDER — PENICILLIN G 3 MILLION UNITS IVPB - SIMPLE MED
3.0000 10*6.[IU] | INTRAVENOUS | Status: DC
  Administered 2018-10-31 (×4): 3 10*6.[IU] via INTRAVENOUS
  Filled 2018-10-30 (×12): qty 100

## 2018-10-30 MED ORDER — ONDANSETRON HCL 4 MG/2ML IJ SOLN: 4.0000 mg | Freq: Four times a day (QID) | INTRAMUSCULAR | Status: DC | PRN

## 2018-10-30 MED ORDER — PHENYLEPHRINE 40 MCG/ML (10ML) SYRINGE FOR IV PUSH (FOR BLOOD PRESSURE SUPPORT)
80.0000 ug | PREFILLED_SYRINGE | INTRAVENOUS | Status: DC | PRN
  Filled 2018-10-30 (×2): qty 10

## 2018-10-30 MED ORDER — ACETAMINOPHEN 325 MG PO TABS: 650.0000 mg | ORAL_TABLET | ORAL | Status: DC | PRN

## 2018-10-30 NOTE — Progress Notes (Signed)
5 9x9 5 4x18 10 Instruments  2 Injectables

## 2018-10-30 NOTE — H&P (Signed)
Cindy Howell is a 33 y.o. female presenting for spotting and possible preterm labor.  OB History    Gravida  3   Para  2   Term  2   Preterm      AB      Living  2     SAB      TAB      Ectopic      Multiple      Live Births  2          Past Medical History:  Diagnosis Date  . Gestational diabetes   . Urticaria    History reviewed. No pertinent surgical history. Family History: family history is not on file. Social History:  reports that she has never smoked. She has never used smokeless tobacco. She reports that she does not drink alcohol or use drugs.     Maternal Diabetes: Yes:  Diabetes Type:  Insulin/Medication controlled Genetic Screening: Normal Maternal Ultrasounds/Referrals: Normal Fetal Ultrasounds or other Referrals:  Other: growth q4 weeks and BPP weekly from 32 weeks for GDM A2  Maternal Substance Abuse:  No Significant Maternal Medications:  Meds include: Other: Glyburide 2.5mg  QHS  Significant Maternal Lab Results:  Lab values include: Group B Strep positive Other Comments:  None  Review of Systems  Gastrointestinal: Positive for abdominal pain.  Musculoskeletal: Positive for back pain.  Psychiatric/Behavioral: The patient is nervous/anxious.   All other systems reviewed and are negative.  Maternal Medical History:  Reason for admission: Vaginal bleeding.   Contractions: Onset was 2 days ago.   Frequency: irregular.   Perceived severity is mild.    Fetal activity: Perceived fetal activity is normal.   Last perceived fetal movement was within the past hour.    Prenatal complications: No pre-eclampsia.   Prenatal Complications - Diabetes: gestational. Diabetes is managed by oral agent (monotherapy).      Dilation: 6 Effacement (%): 60 Station: -2 Exam by:: [employer], CNM  Vitals:   10/30/18 1746 10/30/18 2024 10/30/18 2028  BP: 126/88 121/83   Pulse: 100 95   Resp: 18 17   Temp: 97.8 F (36.6 C) 98.3 F (36.8 C)   TempSrc:  Oral Oral   Weight: 81.2 kg  79.8 kg  Height: 5\' 4"  (1.626 m)  5\' 4"  (1.626 m)   Results for orders placed or performed during the hospital encounter of 10/30/18 (from the past 24 hour(s))  Urinalysis, Routine w reflex microscopic     Status: Abnormal   Collection Time: 10/30/18  5:50 PM  Result Value Ref Range   Color, Urine YELLOW YELLOW   APPearance CLEAR CLEAR   Specific Gravity, Urine <1.005 (L) 1.005 - 1.030   pH 7.0 5.0 - 8.0   Glucose, UA NEGATIVE NEGATIVE mg/dL   Hgb urine dipstick SMALL (A) NEGATIVE   Bilirubin Urine NEGATIVE NEGATIVE   Ketones, ur NEGATIVE NEGATIVE mg/dL   Protein, ur NEGATIVE NEGATIVE mg/dL   Nitrite NEGATIVE NEGATIVE   Leukocytes,Ua TRACE (A) NEGATIVE  Urinalysis, Microscopic (reflex)     Status: Abnormal   Collection Time: 10/30/18  5:50 PM  Result Value Ref Range   RBC / HPF 0-5 0 - 5 RBC/hpf   WBC, UA 0-5 0 - 5 WBC/hpf   Bacteria, UA RARE (A) NONE SEEN   Squamous Epithelial / LPF 0-5 0 - 5  Glucose, capillary     Status: None   Collection Time: 10/30/18  6:15 PM  Result Value Ref Range   Glucose-Capillary 76  70 - 99 mg/dL  CBC     Status: Abnormal   Collection Time: 10/30/18  7:02 PM  Result Value Ref Range   WBC 10.9 (H) 4.0 - 10.5 K/uL   RBC 3.99 3.87 - 5.11 MIL/uL   Hemoglobin 12.2 12.0 - 15.0 g/dL   HCT 36.2 36.0 - 46.0 %   MCV 90.7 80.0 - 100.0 fL   MCH 30.6 26.0 - 34.0 pg   MCHC 33.7 30.0 - 36.0 g/dL   RDW 13.2 11.5 - 15.5 %   Platelets 214 150 - 400 K/uL   nRBC 0.0 0.0 - 0.2 %  Type and screen [facility]     Status: None   Collection Time: 10/30/18  7:02 PM  Result Value Ref Range   ABO/RH(D) B POS    Antibody Screen NEG    Sample Expiration      11/02/2018 Performed at [facility], [address], [place], [place] [address]   Glucose, capillary     Status: None   Collection Time: 10/30/18  8:20 PM  Result Value Ref Range   Glucose-Capillary 71 70 - 99 mg/dL     Maternal Exam:  Abdomen:  Patient reports no abdominal tenderness. Fundal height is Size=dates.   Estimated fetal weight is 6.5lbs.   Fetal presentation: vertex  Introitus: Normal vulva. Vagina is positive for vaginal discharge.  Pelvis: adequate for delivery.   Cervix: Cervix evaluated by digital exam.     Fetal Exam Fetal Monitor Review: Mode: ultrasound.   Baseline rate: 150.  Variability: moderate (6-25 bpm).   Pattern: accelerations present and no decelerations.    Fetal State Assessment: Category I - tracings are normal.    Dilation: 6 Effacement (%): 60 Station: -2 Presentation: Vertex Exam by:: Lissa Merlin, CNM  Physical Exam  Nursing note and vitals reviewed. Constitutional: She is oriented to person, place, and time. She appears well-developed and well-nourished.  HENT:  Head: Normocephalic and atraumatic.  Eyes: Pupils are equal, round, and reactive to light.  Cardiovascular: Normal rate, regular rhythm and normal heart sounds.  Respiratory: Effort normal and breath sounds normal. No respiratory distress.  GI: There is no abdominal tenderness.  Genitourinary:    Vulva and uterus normal.     Vaginal discharge present.   Musculoskeletal: Normal range of motion.  Neurological: She is alert and oriented to person, place, and time.  Skin: Skin is warm and dry.  Psychiatric: She has a normal mood and affect. Her behavior is normal. Judgment and thought content normal.    Prenatal labs: ABO, Rh: --/--/B POS (02/14 1902) Antibody: NEG (02/14 1902) Rubella: 12.60 (07/19 1203) RPR: Non Reactive (07/19 1203)  HBsAg: Negative (07/19 1203)  HIV: Non Reactive (07/19 1203)  GBS:   Positive   Assessment/Plan: 33 y.o. G3P2 at [redacted]w[redacted]d Preterm labor Category 1 FHTs, reactive NST GBS Positive- PCN ordered  Admit to L&D Betamethasone x2 doses q24hrs  Anticipate NSVD Patient desires Dr. Alwyn Pea as delivering provider, Dr. Alwyn Pea made aware and will attend delivery if possible   Marikay Alar 10/30/2018,  8:48 PM

## 2018-10-30 NOTE — MAU Note (Signed)
Pt C/O contractions x 2 days, started spotting & having back pain today.  Reports good fetal movement.

## 2018-10-31 ENCOUNTER — Inpatient Hospital Stay (HOSPITAL_COMMUNITY): Payer: Medicaid Other | Admitting: Anesthesiology

## 2018-10-31 LAB — GLUCOSE, CAPILLARY
Glucose-Capillary: 112 mg/dL — ABNORMAL HIGH (ref 70–99)
Glucose-Capillary: 121 mg/dL — ABNORMAL HIGH (ref 70–99)
Glucose-Capillary: 131 mg/dL — ABNORMAL HIGH (ref 70–99)
Glucose-Capillary: 165 mg/dL — ABNORMAL HIGH (ref 70–99)
Glucose-Capillary: 199 mg/dL — ABNORMAL HIGH (ref 70–99)
Glucose-Capillary: 93 mg/dL (ref 70–99)
Glucose-Capillary: 98 mg/dL (ref 70–99)

## 2018-10-31 LAB — RPR: RPR Ser Ql: NONREACTIVE

## 2018-10-31 LAB — ABO/RH: ABO/RH(D): B POS

## 2018-10-31 MED ORDER — LACTATED RINGERS IV SOLN: 500.0000 mL | Freq: Once | INTRAVENOUS | Status: DC

## 2018-10-31 MED ORDER — BENZOCAINE-MENTHOL 20-0.5 % EX AERO: 1.0000 "application " | INHALATION_SPRAY | CUTANEOUS | Status: DC | PRN

## 2018-10-31 MED ORDER — PRENATAL MULTIVITAMIN CH
1.0000 | ORAL_TABLET | Freq: Every day | ORAL | Status: DC
  Administered 2018-11-01 – 2018-11-02 (×2): 1 via ORAL
  Filled 2018-10-31 (×2): qty 1

## 2018-10-31 MED ORDER — COCONUT OIL OIL: 1.0000 "application " | TOPICAL_OIL | Status: DC | PRN

## 2018-10-31 MED ORDER — ACETAMINOPHEN 325 MG PO TABS
650.0000 mg | ORAL_TABLET | ORAL | Status: DC | PRN
  Administered 2018-10-31: 650 mg via ORAL
  Filled 2018-10-31: qty 2

## 2018-10-31 MED ORDER — LACTATED RINGERS IV SOLN
INTRAVENOUS | Status: DC
  Administered 2018-10-31: 12:00:00 via INTRAVENOUS

## 2018-10-31 MED ORDER — ZOLPIDEM TARTRATE 5 MG PO TABS: 5.0000 mg | ORAL_TABLET | Freq: Every evening | ORAL | Status: DC | PRN

## 2018-10-31 MED ORDER — DIBUCAINE 1 % RE OINT: 1.0000 "application " | TOPICAL_OINTMENT | RECTAL | Status: DC | PRN

## 2018-10-31 MED ORDER — EPHEDRINE 5 MG/ML INJ: 10.0000 mg | INTRAVENOUS | Status: DC | PRN

## 2018-10-31 MED ORDER — SIMETHICONE 80 MG PO CHEW: 80.0000 mg | CHEWABLE_TABLET | ORAL | Status: DC | PRN

## 2018-10-31 MED ORDER — ACETAMINOPHEN 325 MG PO TABS: 650.0000 mg | ORAL_TABLET | ORAL | Status: DC | PRN

## 2018-10-31 MED ORDER — LIDOCAINE HCL (PF) 1 % IJ SOLN
INTRAMUSCULAR | Status: DC | PRN
  Administered 2018-10-31: 13 mL via EPIDURAL

## 2018-10-31 MED ORDER — OXYCODONE-ACETAMINOPHEN 5-325 MG PO TABS: 2.0000 | ORAL_TABLET | ORAL | Status: DC | PRN

## 2018-10-31 MED ORDER — DIPHENHYDRAMINE HCL 25 MG PO CAPS: 25.0000 mg | ORAL_CAPSULE | Freq: Four times a day (QID) | ORAL | Status: DC | PRN

## 2018-10-31 MED ORDER — PRENATAL MULTIVITAMIN CH: 1.0000 | ORAL_TABLET | Freq: Every day | ORAL | Status: DC

## 2018-10-31 MED ORDER — PHENYLEPHRINE 40 MCG/ML (10ML) SYRINGE FOR IV PUSH (FOR BLOOD PRESSURE SUPPORT): 80.0000 ug | PREFILLED_SYRINGE | INTRAVENOUS | Status: DC | PRN

## 2018-10-31 MED ORDER — INSULIN ASPART 100 UNIT/ML ~~LOC~~ SOLN
0.0000 [IU] | SUBCUTANEOUS | Status: DC | PRN
  Administered 2018-10-31 – 2018-11-01 (×3): 2 [IU] via SUBCUTANEOUS
  Administered 2018-11-01: 3 [IU] via SUBCUTANEOUS
  Filled 2018-10-31: qty 0.15
  Filled 2018-10-31: qty 1
  Filled 2018-10-31: qty 0.15
  Filled 2018-10-31: qty 1
  Filled 2018-10-31: qty 0.15

## 2018-10-31 MED ORDER — ZOLPIDEM TARTRATE 5 MG PO TABS
5.0000 mg | ORAL_TABLET | Freq: Every evening | ORAL | Status: DC | PRN
  Administered 2018-10-31: 5 mg via ORAL
  Filled 2018-10-31: qty 1

## 2018-10-31 MED ORDER — DIPHENHYDRAMINE HCL 50 MG/ML IJ SOLN: 12.5000 mg | INTRAMUSCULAR | Status: DC | PRN

## 2018-10-31 MED ORDER — OXYCODONE-ACETAMINOPHEN 5-325 MG PO TABS
1.0000 | ORAL_TABLET | ORAL | Status: DC | PRN
  Administered 2018-11-01 (×2): 1 via ORAL
  Filled 2018-10-31 (×2): qty 1

## 2018-10-31 MED ORDER — OXYTOCIN 10 UNIT/ML IJ SOLN
10.0000 [IU] | Freq: Once | INTRAMUSCULAR | Status: AC
  Administered 2018-10-31: 10 [IU] via INTRAMUSCULAR
  Filled 2018-10-31: qty 1

## 2018-10-31 MED ORDER — SENNOSIDES-DOCUSATE SODIUM 8.6-50 MG PO TABS
2.0000 | ORAL_TABLET | ORAL | Status: DC
  Administered 2018-10-31 – 2018-11-01 (×2): 2 via ORAL
  Filled 2018-10-31 (×2): qty 2

## 2018-10-31 MED ORDER — ONDANSETRON HCL 4 MG/2ML IJ SOLN: 4.0000 mg | INTRAMUSCULAR | Status: DC | PRN

## 2018-10-31 MED ORDER — CALCIUM CARBONATE ANTACID 500 MG PO CHEW
2.0000 | CHEWABLE_TABLET | ORAL | Status: DC | PRN
  Administered 2018-10-31 – 2018-11-02 (×6): 400 mg via ORAL
  Filled 2018-10-31 (×6): qty 2

## 2018-10-31 MED ORDER — IBUPROFEN 600 MG PO TABS
600.0000 mg | ORAL_TABLET | Freq: Four times a day (QID) | ORAL | Status: DC
  Administered 2018-10-31 – 2018-11-02 (×8): 600 mg via ORAL
  Filled 2018-10-31 (×8): qty 1

## 2018-10-31 MED ORDER — FENTANYL 2.5 MCG/ML BUPIVACAINE 1/10 % EPIDURAL INFUSION (WH - ANES): 14.0000 mL/h | INTRAMUSCULAR | Status: DC | PRN

## 2018-10-31 MED ORDER — WITCH HAZEL-GLYCERIN EX PADS: 1.0000 "application " | MEDICATED_PAD | CUTANEOUS | Status: DC | PRN

## 2018-10-31 MED ORDER — ONDANSETRON HCL 4 MG PO TABS: 4.0000 mg | ORAL_TABLET | ORAL | Status: DC | PRN

## 2018-10-31 NOTE — Anesthesia Postprocedure Evaluation (Signed)
Anesthesia Post Note  Patient: Pharmacologist  Procedure(s) Performed: AN AD HOC LABOR EPIDURAL     Patient location during evaluation: Mother Baby Anesthesia Type: Epidural Level of consciousness: awake and alert and oriented Pain management: satisfactory to patient Vital Signs Assessment: post-procedure vital signs reviewed and stable Respiratory status: spontaneous breathing and nonlabored ventilation Cardiovascular status: stable Postop Assessment: no headache, no backache, no signs of nausea or vomiting, adequate PO intake, patient able to bend at knees, able to ambulate and no apparent nausea or vomiting (patient up walking) Anesthetic complications: no    Last Vitals:  Vitals:   10/31/18 1520 10/31/18 1617  BP: 111/76 114/72  Pulse: 79 79  Resp: 18 18  Temp: 36.8 C   SpO2: 100%     Last Pain:  Vitals:   10/31/18 1520  TempSrc: Axillary  PainSc: 0-No pain   Pain Goal: Patients Stated Pain Goal: 0 (10/30/18 1748)                 Willa Rough

## 2018-10-31 NOTE — Progress Notes (Signed)
Patients CBG 199 @2217 , after nurse finished taking CBG patient informed nurse that it had only been an hour since last main meal. Nurse will recheck CBG 2 hours after last main meal.

## 2018-10-31 NOTE — Progress Notes (Addendum)
Cindy Howell is a 33 y.o. G3P2002 at [redacted]w[redacted]d admitted for  Preterm labor  Subjective: Patient feels comfortable after epidural placement.  She denies feeling any more contractions.    Objective: BP 123/75   Pulse 94   Temp (!) 97.5 F (36.4 C) (Oral)   Resp 20   Ht 5\' 4"  (1.626 m)   Wt 79.8 kg   LMP 02/19/2018   BMI 30.21 kg/m  No intake/output data recorded. No intake/output data recorded.  FHT:  FHR: 130 bpm, variability: moderate,  accelerations:  Present,  decelerations:  Absent UC:   irregular, every 2 to 5 minutes SVE:   Dilation: 6 Effacement (%): 80 Station: -2 Exam by:: Alesia Richards, MD at 1125am.  About 20cc dark red blood clots noted on glove after exam, no active bleeding. Bed side ultrasound done shows cephalic presentation, fundal/left anterior placenta with possible small hypoechoic area (5cm or less by 1cm ) behind the placenta fundally, I wonder if normal placenta/uterine interface versus placenta abruption.   Labs: Lab Results  Component Value Date   WBC 10.9 (H) 10/30/2018   HGB 12.2 10/30/2018   HCT 36.2 10/30/2018   MCV 90.7 10/30/2018   PLT 214 10/30/2018   Blood group Rh positive.   Assessment / Plan: Preterm labor at 36 weeks 2 days EGA  Labor: Reassess cervix in 3 hours.  If mother and fetus remain stable witll continue with expectant management, administer second betamethasone injection at 8 pm tonight.  Will not tocolyse labor. Watch closely for vaginal bleeding, consider official ultrasound if persistent and heavy bleeding.  Preeclampsia:  none Fetal Wellbeing:  Category I Pain Control:  Epidural I/D:  GBS positive on penicillin.  Anticipated MOD:  NSVD  Alinda Dooms, MD. 10/31/2018, 12:36 PM

## 2018-10-31 NOTE — Progress Notes (Signed)
Cindy Howell is a 33 y.o. G3P2002 at [redacted]w[redacted]d admitted for Preterm labor  Subjective: Patient reports contractions feel stronger, in addition to mild cramping she is feeling abdominal tightening as well. Patient desires to take something for sleep, Ambien ordered. Patient is making slow cervical change.   Objective: Vitals:   10/30/18 2144 10/30/18 2201 10/30/18 2301 10/31/18 0001  BP: 116/78 119/69 111/72 121/75  Pulse: 83 82 88 91  Resp: 16 16 17 17   Temp:      TempSrc:      Weight:      Height:       FHT:  FHR: 140-150s bpm, variability: moderate,  accelerations:  Present,  decelerations:  Absent UC:   irregular, every 4-8 minutes SVE:   Dilation: 6 Effacement (%): 80 Station: -1 Exam by:: Lissa Merlin, CNM  Labs: Lab Results  Component Value Date   WBC 10.9 (H) 10/30/2018   HGB 12.2 10/30/2018   HCT 36.2 10/30/2018   MCV 90.7 10/30/2018   PLT 214 10/30/2018    Assessment / Plan: Spontaneous labor, progressing normally  Labor: Progressing normally Preeclampsia:  no signs or symptoms of toxicity, intake and ouput balanced and labs stable Fetal Wellbeing:  Category I Pain Control:  Labor support without medications I/D:  n/a Anticipated MOD:  NSVD  Marikay Alar 10/31/2018, 12:47 AM

## 2018-10-31 NOTE — Progress Notes (Signed)
Labor Progress Note Cindy Howell a 32 y.H.[id number] at [redacted]w[redacted]d admitted for Preterm labor. I was called to pt room because of increased VB and pt feeling contractions after epidural.  Subjective:  Pt feels contractions now, did not immediately after epidural. Has had the sensation of wetness and alerted the nurses. Was found to have more bleeding.   Objective:  BP (!) 89/34   Pulse 83   Temp 99.4 F (37.4 C) (Oral)   Resp 18   Ht 5\' 4"  (1.626 m)   Wt 79.8 kg   LMP 02/19/2018   SpO2 98% Comment: patient states that she feels SOB  BMI 30.21 kg/m   FHT: Category 2 with deep narrow variables with each contractions (pt repositioned with no effect) UC:   regular, every 4 minutes SVE:   Dilation: 10 Effacement (%): 100 Station: 0 Exam by:: Altha Harm, CNM   Assessment/Plan:  Pt is for MD delivery only. Dr. Alesia Richards notified pt is category 2 and increased bleeding. She ordered SVE, AROM and internalization if no resolution of variables. Pt complete upon SVE with BBOW. AROM performed IUPC placed. Dr Alesia Richards called me at this moment and I summoned her to the room. Dr. Alesia Richards came to [facility] and tried to wait for FOB to arrive but proceeded with delivery because of increased bleeding.  Altha Harm, CNM 10/31/2018, [phone]

## 2018-10-31 NOTE — Anesthesia Procedure Notes (Signed)
Epidural Patient location during procedure: OB Start time: 10/31/2018 10:32 AM End time: 10/31/2018 10:53 AM  Staffing Anesthesiologist: Lynda Rainwater, MD Performed: anesthesiologist   Preanesthetic Checklist Completed: patient identified, site marked, surgical consent, pre-op evaluation, timeout performed, IV checked, risks and benefits discussed and monitors and equipment checked  Epidural Patient position: sitting Prep: ChloraPrep Patient monitoring: heart rate, cardiac monitor, continuous pulse ox and blood pressure Approach: midline Location: L2-L3 Injection technique: LOR saline  Needle:  Needle type: Tuohy  Needle gauge: 17 G Needle length: 9 cm Needle insertion depth: 4 cm Catheter type: closed end flexible Catheter size: 20 Guage Catheter at skin depth: 8 cm Test dose: negative  Assessment Events: blood not aspirated, injection not painful, no injection resistance, negative IV test and no paresthesia  Additional Notes Reason for block:procedure for pain

## 2018-10-31 NOTE — H&P (Signed)
Cindy Howell is a 33 y.o. female presenting for spotting and possible preterm labor.  OB History    Gravida  3   Para  2   Term  2   Preterm      AB      Living  2     SAB      TAB      Ectopic      Multiple      Live Births  2          Past Medical History:  Diagnosis Date  . Gestational diabetes   . Urticaria    History reviewed. No pertinent surgical history. Family History: family history is not on file. Social History:  reports that she has never smoked. She has never used smokeless tobacco. She reports that she does not drink alcohol or use drugs.     Maternal Diabetes: Yes:  Diabetes Type:  Insulin/Medication controlled Genetic Screening: Normal Maternal Ultrasounds/Referrals: Normal Fetal Ultrasounds or other Referrals:  Other: growth q4 weeks and BPP weekly from 32 weeks for GDM A2  Maternal Substance Abuse:  No Significant Maternal Medications:  Meds include: Other: Glyburide 2.5mg  QHS  Significant Maternal Lab Results:  Lab values include: Group B Strep positive Other Comments:  None  Review of Systems  Gastrointestinal: Positive for abdominal pain.  Musculoskeletal: Positive for back pain.  Psychiatric/Behavioral: The patient is nervous/anxious.   All other systems reviewed and are negative.  Maternal Medical History:  Reason for admission: Vaginal bleeding.   Contractions: Onset was 2 days ago.   Frequency: irregular.   Perceived severity is mild.    Fetal activity: Perceived fetal activity is normal.   Last perceived fetal movement was within the past hour.    Prenatal complications: No pre-eclampsia.   Prenatal Complications - Diabetes: gestational. Diabetes is managed by oral agent (monotherapy).      Dilation: 6 Effacement (%): 80 Station: -1 Exam by:: [employer], CNM  Vitals:   10/30/18 2144 10/30/18 2201 10/30/18 2301 10/31/18 0001  BP: 116/78 119/69 111/72 121/75  Pulse: 83 82 88 91  Resp: 16 16 17 17   Temp:       TempSrc:      Weight:      Height:       Results for orders placed or performed during the hospital encounter of 10/30/18 (from the past 24 hour(s))  Urinalysis, Routine w reflex microscopic     Status: Abnormal   Collection Time: 10/30/18  5:50 PM  Result Value Ref Range   Color, Urine YELLOW YELLOW   APPearance CLEAR CLEAR   Specific Gravity, Urine <1.005 (L) 1.005 - 1.030   pH 7.0 5.0 - 8.0   Glucose, UA NEGATIVE NEGATIVE mg/dL   Hgb urine dipstick SMALL (A) NEGATIVE   Bilirubin Urine NEGATIVE NEGATIVE   Ketones, ur NEGATIVE NEGATIVE mg/dL   Protein, ur NEGATIVE NEGATIVE mg/dL   Nitrite NEGATIVE NEGATIVE   Leukocytes,Ua TRACE (A) NEGATIVE  Urinalysis, Microscopic (reflex)     Status: Abnormal   Collection Time: 10/30/18  5:50 PM  Result Value Ref Range   RBC / HPF 0-5 0 - 5 RBC/hpf   WBC, UA 0-5 0 - 5 WBC/hpf   Bacteria, UA RARE (A) NONE SEEN   Squamous Epithelial / LPF 0-5 0 - 5  Glucose, capillary     Status: None   Collection Time: 10/30/18  6:15 PM  Result Value Ref Range   Glucose-Capillary 76 70 - 99 mg/dL  CBC     Status: Abnormal   Collection Time: 10/30/18  7:02 PM  Result Value Ref Range   WBC 10.9 (H) 4.0 - 10.5 K/uL   RBC 3.99 3.87 - 5.11 MIL/uL   Hemoglobin 12.2 12.0 - 15.0 g/dL   HCT 36.2 36.0 - 46.0 %   MCV 90.7 80.0 - 100.0 fL   MCH 30.6 26.0 - 34.0 pg   MCHC 33.7 30.0 - 36.0 g/dL   RDW 13.2 11.5 - 15.5 %   Platelets 214 150 - 400 K/uL   nRBC 0.0 0.0 - 0.2 %  Type and screen [place]     Status: None   Collection Time: 10/30/18  7:02 PM  Result Value Ref Range   ABO/RH(D) B POS    Antibody Screen NEG    Sample Expiration      11/02/2018 Performed at [facility], [address], [place],  [address]   Glucose, capillary     Status: None   Collection Time: 10/30/18  8:20 PM  Result Value Ref Range   Glucose-Capillary 71 70 - 99 mg/dL  Glucose, capillary     Status: Abnormal   Collection Time: 10/31/18 12:26  AM  Result Value Ref Range   Glucose-Capillary 121 (H) 70 - 99 mg/dL     Maternal Exam:  Abdomen: Patient reports no abdominal tenderness. Fundal height is Size=dates.   Estimated fetal weight is 6.5lbs.   Fetal presentation: vertex  Introitus: Normal vulva. Vagina is positive for vaginal discharge.  Pelvis: adequate for delivery.   Cervix: Cervix evaluated by digital exam.     Fetal Exam Fetal Monitor Review: Mode: ultrasound.   Baseline rate: 150.  Variability: moderate (6-25 bpm).   Pattern: accelerations present and no decelerations.    Fetal State Assessment: Category I - tracings are normal.    Dilation: 6 Effacement (%): 80 Station: -1 Presentation: Vertex Exam by:: Lissa Merlin, CNM  Physical Exam  Nursing note and vitals reviewed. Constitutional: She is oriented to person, place, and time. She appears well-developed and well-nourished.  HENT:  Head: Normocephalic and atraumatic.  Eyes: Pupils are equal, round, and reactive to light.  Cardiovascular: Normal rate, regular rhythm and normal heart sounds.  Respiratory: Effort normal and breath sounds normal. No respiratory distress.  GI: There is no abdominal tenderness.  Genitourinary:    Vulva and uterus normal.     Vaginal discharge present.   Musculoskeletal: Normal range of motion.  Neurological: She is alert and oriented to person, place, and time.  Skin: Skin is warm and dry.  Psychiatric: She has a normal mood and affect. Her behavior is normal. Judgment and thought content normal.    Prenatal labs: ABO, Rh: --/--/B POS (02/14 1902) Antibody: NEG (02/14 1902) Rubella: 12.60 (07/19 1203) RPR: Non Reactive (07/19 1203)  HBsAg: Negative (07/19 1203)  HIV: Non Reactive (07/19 1203)  GBS: Positive (02/11 0000) Positive   Assessment/Plan: 33 y.o. G3P2 at [redacted]w[redacted]d Preterm labor Category 1 FHTs, reactive NST GBS Positive- PCN ordered  Admit to L&D Betamethasone x2 doses q24hrs GDM A2, per Dr. Alwyn Pea CBG q  4hrs and moderate sliding scale insulin q4hrs PRN, will reassess need for insulin drip if patient consistently elevated  Anticipate NSVD Patient desires Dr. Alwyn Pea as delivering provider, Dr. Alwyn Pea made aware and will attend delivery if possible   Marikay Alar 10/31/2018, 12:45 AM

## 2018-10-31 NOTE — Lactation Note (Addendum)
This note was copied from a baby's chart. Lactation Consultation Note  Patient Name: Cindy Howell LHTDS'K Date: 10/31/2018 Reason for consult: Initial assessment;Difficult latch;Infant < 6lbs;Late-preterm 34-36.6wks;Maternal endocrine disorder Type of Endocrine Disorder?: Diabetes  GDM  Visited with P3 Mom of LPTI ([redacted]w[redacted]d) at 3 hrs old.  Baby <6 lbs.  Mom GDM and baby's CBG initially was 20.  Baby fed 12 ml of 22 cal formula well, by bottle.  Mom tired and very hungry currently.  Baby sleeping swaddled.  Assisted Mom to double pump to support a full milk supply.  Drops of colostrum expressed and Mom used her finger to drops some colostrum in baby's mouth.  LPTI guidelines presented.  Reviewed important points of STS, and feeding baby often or >8 times per 24 hrs, waking as needed. Baby latched on after delivery.  (Mom has a 79 month old at home that still likes to nurse occasionally).  Talked about offering the breast first, not to breastfeed longer than 30 mins, and to pump after breastfeeding.  Baby to be supplemented per LPTI guidelines.   Mom has large breasts with large diameter and long nipples.  Watched Mom try to latch baby by pushing nipple into baby's mouth.  Baby not interested and encouraged Mom to keep baby STS on her chest.    2nd CBG 47.  Encouraged continued regular supplementation with 22 cal formula+/ EBM due to baby being small, difficult to latch deep enough, and Mom being a GDM.  Plan- 1- Keep baby STS as much as possible 2- Awaken baby by 3 hrs for feeding, offering breast first, and then supplementing with 5-10 ml of EBM+/22 cal formula 3- pump both breasts for 15 mins on initiation setting. 4- Ask for help prn.  Mom has [facility].  Faxed referral as Mom will need a DEBP at discharge.  Lactation brochure left with Mom.  Mom aware of IP and OP lactation support available to her.  Encouraged Mom to follow-up with OP lactation appointment after discharge.  Consult  Status Consult Status: Follow-up Date: 11/01/18 Follow-up type: In-patient    Broadus John 10/31/2018, 5:20 PM

## 2018-10-31 NOTE — Progress Notes (Signed)
Hospital day # 1  Cindy Howell is a 33 y.o. G3P2002 at [redacted]w[redacted]d admitted for Preterm labor S:   Pt was sleeping this morning when I entered her room. She said her only discomfort was feeling "little bit crampy," but could not say how often because of being asleep, however, she would rate it a 3 or 4 on a 10 point scale. She denies headache, visual changes, epigastric pain, LOF. She could not speak to FM because of being asleep. She still has some scant vaginal spotting. We discussed that she would need to remain in the hospital and patient agreed. We discussed plan to move to antepartum unit as long as contractions remain spaced apart and less intense without further cervical change. Patient voiced understanding. Partner had gone home to care for their son.   O:  BP (!) 105/54   Pulse 85   Temp 98.8 F (37.1 C) (Axillary)   Resp 16   Ht 5\' 4"  (1.626 m)   Wt 79.8 kg   LMP 02/19/2018   BMI 30.21 kg/m       Fetal tracings: NICHD category 1, moderate variability, accels present, decels absent. Baseline 120.      Contractions:   None tracing in the R lateral position.      Uterus gravid and consistent with 36 weeks  A:  [redacted]w[redacted]d with advanced cervical dilation Unchanged overnight  P:  Transfer to antepartum per Dr. Alesia Richards Continuous monitoring Continue GBS prophylaxis       Altha Harm CNM, MSN 10/31/2018 7:21 AM

## 2018-10-31 NOTE — Progress Notes (Addendum)
Labor Progress Note Charleene Baruais a 14 y.V.[id number] at [redacted]w[redacted]d admitted for Preterm labor Transfer to antepartum was canceled at 0900 due to increased frequency of ctxns (q4-7 min) and increased pain (6-7 out of 10).   Subjective:  Pt states contractions are worse and is amenable to receiving epidural at this time. Will notify partner at home as well.  Objective:  BP 113/67   Pulse 69   Temp 98.8 F (37.1 C) (Oral)   Resp 18   Ht 5\' 4"  (1.626 m)   Wt 79.8 kg   LMP 02/19/2018   BMI 30.21 kg/m   CBG (last 3)  Recent Labs    10/31/18 0151 10/31/18 0322 10/31/18 0723  GLUCAP 131* 112* 98    FHT: Category 1, reactive UC:  Have spaced back out again- currently only 1-2 in 10 minutes SVE:   Dilation: 7 Effacement (%): 80 Station: -2 Exam by:: Altha Harm, CNM  Palpated facial features posteriorly. BOW no longer palpable. Will check position by [place] after epidural.  Assessment/Plan:  Fetal Wellbeing: cat 1 Labor: progressing Preeclampsia:  n/a GBS:   on PCN Pain Control:  receiving epidural now Anticipated MOD:  SVD  Dr. Alesia Richards updated.   Altha Harm, CNM 10/31/2018, 10:00 AM

## 2018-10-31 NOTE — Progress Notes (Addendum)
Cindy Howell is a 33 y.o. G3P2002 at [redacted]w[redacted]d admitted for Preterm labor  Subjective: Patient reports no longer feeling contractions, they are tracing rarely on tocometer.   Objective: Vitals:   10/31/18 0358 10/31/18 0401 10/31/18 0522 10/31/18 0523  BP:  105/64 125/78 125/78  Pulse:  73 98 98  Resp:  16    Temp: 98.8 F (37.1 C)     TempSrc: Axillary     Weight:      Height:       FHT:  FHR: 120s bpm, variability: moderate,  accelerations:  Present,  decelerations:  Absent UC:   irregular, every 12 minutes SVE:   Dilation: 6 Effacement (%): 80 Station: -1 Exam by:: Lissa Merlin, CNM  Labs: Lab Results  Component Value Date   WBC 10.9 (H) 10/30/2018   HGB 12.2 10/30/2018   HCT 36.2 10/30/2018   MCV 90.7 10/30/2018   PLT 214 10/30/2018    Assessment / Plan: Preterm labor, cervical dilation has stalled   Preeclampsia:  no signs or symptoms of toxicity, intake and ouput balanced and labs stable Fetal Wellbeing:  Category I Pain Control:  Labor support without medications I/D:  n/a Anticipated MOD:  NSVD  Marikay Alar 10/31/2018, 5:31 AM

## 2018-10-31 NOTE — Anesthesia Preprocedure Evaluation (Signed)
Anesthesia Evaluation  Patient identified by MRN, date of birth, ID band Patient awake    Reviewed: Allergy & Precautions, NPO status , Patient's Chart, lab work & pertinent test results  Airway Mallampati: II  TM Distance: >3 FB Neck ROM: Full    Dental no notable dental hx.    Pulmonary neg pulmonary ROS,    Pulmonary exam normal breath sounds clear to auscultation       Cardiovascular negative cardio ROS Normal cardiovascular exam Rhythm:Regular Rate:Normal     Neuro/Psych negative neurological ROS  negative psych ROS   GI/Hepatic negative GI ROS, Neg liver ROS,   Endo/Other  negative endocrine ROSdiabetes  Renal/GU negative Renal ROS  negative genitourinary   Musculoskeletal negative musculoskeletal ROS (+)   Abdominal   Peds negative pediatric ROS (+)  Hematology negative hematology ROS (+)   Anesthesia Other Findings   Reproductive/Obstetrics (+) Pregnancy                             Anesthesia Physical Anesthesia Plan  ASA: III  Anesthesia Plan: Epidural   Post-op Pain Management:    Induction:   PONV Risk Score and Plan:   Airway Management Planned:   Additional Equipment:   Intra-op Plan:   Post-operative Plan:   Informed Consent:   Plan Discussed with:   Anesthesia Plan Comments:         Anesthesia Quick Evaluation

## 2018-11-01 LAB — GLUCOSE, CAPILLARY
Glucose-Capillary: 107 mg/dL — ABNORMAL HIGH (ref 70–99)
Glucose-Capillary: 118 mg/dL — ABNORMAL HIGH (ref 70–99)
Glucose-Capillary: 144 mg/dL — ABNORMAL HIGH (ref 70–99)

## 2018-11-01 LAB — CBC
HCT: 33.1 % — ABNORMAL LOW (ref 36.0–46.0)
Hemoglobin: 10.8 g/dL — ABNORMAL LOW (ref 12.0–15.0)
MCH: 29.9 pg (ref 26.0–34.0)
MCHC: 32.6 g/dL (ref 30.0–36.0)
MCV: 91.7 fL (ref 80.0–100.0)
Platelets: 185 10*3/uL (ref 150–400)
RBC: 3.61 MIL/uL — ABNORMAL LOW (ref 3.87–5.11)
RDW: 13.4 % (ref 11.5–15.5)
WBC: 13.5 10*3/uL — ABNORMAL HIGH (ref 4.0–10.5)
nRBC: 0 % (ref 0.0–0.2)

## 2018-11-01 MED ORDER — INSULIN ASPART 100 UNIT/ML ~~LOC~~ SOLN: 0.0000 [IU] | Freq: Four times a day (QID) | SUBCUTANEOUS | Status: DC

## 2018-11-01 NOTE — Progress Notes (Signed)
Cindy Howell a 23 y.Y.Y5R1021 who delivered at [redacted]w[redacted]d Subjective: Postpartum Day 1: Vaginal delivery, no laceration Patient up ad lib, reports no syncope or dizziness. Pt is smiling and happy, talkative.  She is urinating without difficulty and has already had a bowel movement. Cindy Howell reports abd after-pains, relieved by ibuprofen. Feeding:  Breast and bottle. "I don't have enough milk." We had a talk about supply and demand, suckling stimulating milk production, and the appropriately small size of baby's stomach and first colostrum production. She reports bil leg pain that she attributes to the epidural  Objective: Vital signs in last 24 hours: Temp:  [97.5 F (36.4 C)-99.4 F (37.4 C)] 98.1 F (36.7 C) (02/16 0430) Pulse Rate:  [68-168] 68 (02/16 0430) Resp:  [16-20] 17 (02/16 0430) BP: (89-140)/(34-92) 113/81 (02/16 0430) SpO2:  [98 %-100 %] 99 % (02/16 0430)  Physical Exam:  General: alert, cooperative and appears stated age Lochia: appropriate Uterine Fundus: firm Perineum: intact DVT Evaluation: No cords or calf tenderness. No significant calf/ankle edema. BLE are non tender to palpation, no edema present, soft, no redness or induration.   CBC Latest Ref Rng & Units 11/01/2018 10/30/2018 04/03/2018  WBC 4.0 - 10.5 K/uL 13.5(H) 10.9(H) 10.0  Hemoglobin 12.0 - 15.0 g/dL 10.8(L) 12.2 13.0  Hematocrit 36.0 - 46.0 % 33.1(L) 36.2 38.2  Platelets 150 - 400 K/uL 185 214 270   Pt requests heartburn medicine and stool softener on discharge tomorrow.  Assessment/Plan: Status post vaginal delivery day 1. Stable Continue current care. Plan for discharge tomorrow and Breastfeeding    Arvella Merles 11/01/2018, 8:59 AM

## 2018-11-01 NOTE — Progress Notes (Signed)
Two hour PP blood sugars are not accurate related to patient continually eating all day, she has been instructed not to eat for two hours after she finishes a meal, however she continues to eat crackers and food left over from her trays between meals. Instructed patient to not eat for two hours after dinner so we may get an accurate 2 hour pp blood sugar, patient verbalizes understanding, she has not ordered dinner at this point, will pass this information on to the oncoming shift.

## 2018-11-01 NOTE — Lactation Note (Signed)
This note was copied from a baby's chart. Lactation Consultation Note  Patient Name: Cindy Howell MHWKG'[id number] Date: 11/01/2018 Reason for consult: Follow-up assessment;Infant < 6lbs;Maternal endocrine disorder;Late-preterm 34-36.6wks;Infant weight loss Type of Endocrine Disorder?: Diabetes  22 hours LPI female who is being mostly formula fed by her mother at this point, baby is on Similac 22 calorie formula. Mother claims she's pumping every 3 hours but she has only pumped once today, getting about 0.5 ml and mixing it up with formula before giving it to the baby. Mom asked if she can re-use bottles and nipples (wash them and take them home) before throwing away bottle on counter that was sitting there for more than an hour. Educated mom on formula storage guidelines and also reviewed volumes, updated to 10-20 ml per feeding since baby will be turning 24 hours in a couple more hours at 1 pm.  Mom asked about the use of pacifiers, advised mom on waiting at least 3-4 weeks until BF is well established. Reinforced mom the concept that baby is just practicing at the breast but that she still needed to do STS when doing it. Mom hasn't been taking baby to the breast, unsure if mom has been pumping every 3 hours also. LC had to adjust the junctures on the pump because they're loose and let mom know that she'll feed a difference in the suction level the next time she pumps. Baby asleep when entering the room and not ready to feed, mom just gave her a bottle. asked mom to call for assistance when needed.  Feeding plan:  1. Encouraged mom to feed baby a bottle every 3 hours or sooner if feeding cues are present.  2. Pumping every 3 hours was also encouraged, mom will continue mixing up her breastmilk with formula, she voiced this works for her 3. Mom will try putting baby to the breast every 3 hours as well. Reinforced this concept as well since there was something different written down on her board   Mom  reported all questions and concerns were answered, she's aware of [facility] services and will call PRN.  Maternal Data Formula Feeding for Exclusion: Yes Reason for exclusion: Mother's choice to formula and breast feed on admission  Feeding Feeding Type: Formula Nipple Type: Slow - flow   Interventions Interventions: Breast feeding basics reviewed  Lactation Tools Discussed/Used     Consult Status Consult Status: Follow-up Date: 11/02/18 Follow-up type: In-patient    Dierks Wach Francene Boyers 11/01/2018, 11:45 AM

## 2018-11-02 ENCOUNTER — Encounter (HOSPITAL_COMMUNITY): Payer: Self-pay | Admitting: *Deleted

## 2018-11-02 DIAGNOSIS — Z8659 Personal history of other mental and behavioral disorders: Secondary | ICD-10-CM | POA: Insufficient documentation

## 2018-11-02 LAB — GLUCOSE, CAPILLARY: Glucose-Capillary: 80 mg/dL (ref 70–99)

## 2018-11-02 MED ORDER — IBUPROFEN 600 MG PO TABS: 600.0000 mg | ORAL_TABLET | Freq: Four times a day (QID) | ORAL | 0 refills | Status: DC

## 2018-11-02 NOTE — Progress Notes (Addendum)
UPDATE: CSW met with FOB via bedside and provided education on baby blues/ post partum. FOB and MOB requested information on support groups for MOB. CSW provided MOB and FOB with information.   CSW met with MOB via bedside due to history of anxiety/ depression. MOB was appropriate and pleasant during conversation. This is MOB's 3rd child, 31 y.o, and 32 m.o. 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 MOB's 31 year old is currently living with her parents in [place] and MOB/ FOB have not been back to visit since moving. MOB voiced not being financially stable when moving to [place] and did not want to take oldest child out of school. MOB reports this as a stressor for her and being a stay at home mother. MOB states she sometimes feels anxious/ depressed due to staying at home most days and her spouse working a lot. MOB stated she does have some friends/ supports in the area that are able to help her when needed however she does not like to ask because "everyone has stuff going on". CSW questioned if MOB was currently feeling and anxiety/ depression about returning home- MOB stated no. CSW encouraged MOB to follow up with her OBGYN in the event she started to feel more anxiety/ depression- MOB voiced understanding and stated she felt comfortable speaking her her OBGYN. MOB informed CSW her spouse would be arriving to the hospital shortly and asked if writer could return to provide him information on baby blues/ PMADS. CSW will follow up once spouse has arrived.   CSW provided education regarding Baby Blues vs PMADs and provided MOB with resources for mental health follow up.  CSW encouraged MOB to evaluate her mental health throughout the postpartum period with the use of the New Mom Checklist developed by [facility] as well as the [place] Postnatal Depression Scale and notify a medical professional if symptoms arise.    Kingsley Spittle, [place]  [phone]

## 2018-11-02 NOTE — Lactation Note (Addendum)
This note was copied from a baby's chart. Lactation Consultation Note  Patient Name: Cindy Howell [id number] Date: 11/02/2018 Reason for consult: Follow-up assessment;Infant weight loss;Late-preterm 34-36.6wks;Maternal endocrine disorder Type of Endocrine Disorder?: Diabetes  Baby is at 6% weight loss , 25 hours Bili - 6.3  LC was informed this mom does not desire to obtain a DEBP from [facility] and she wants all the formula she can get  From [facility] . WIC has seen mom and mom is aware of the breast feeding packet ( where she would be able to get a DEBP )  And the formula Packet ( only formula no pump ). Mom has chosen the formula packet.  LC went to see this mom and she is mostly bottle feeding , and few short intervals of latching.  Mom expresses she wants to breast feed and will consider going back to [facility] when her milk comes in.  [place] tried to explain Supply and demand in several different ways and felt mom was not listening.  LC explored other  options with her- and instructed her on a hand pump - and recommended prior to latch - Breast massage . Hand express. Pre-pump with hand pump  And latch for 15 -20 mins / supplemented 30 - 45 ml .  When her milk comes in consider calling WIC and changing her packet to breast milk - to obtain a DEBP.  Sore nipple and engorgement prevention and tx reviewed.  Mother informed of post-discharge support and given phone number to the lactation department, including services for phone call assistance; out-patient appointments; and breastfeeding support group. List of other breastfeeding resources in the community given in the handout. Encouraged mother to call for problems or concerns related to breastfeeding.  LC also stressed the importance of feeding her baby 8-12 times a day / at least every 3 hours and with feeding cues.     Maternal Data    Feeding    LATCH Score                   Interventions Interventions: Breast feeding basics  reviewed  Lactation Tools Discussed/Used Tools: Pump;Flanges Flange Size: 24;27 Breast pump type: Double-Electric Breast Pump;Manual WIC Program: (this mom declined the breast feeding WIC packet - see  LC note )   Consult Status Consult Status: Complete Date: 11/02/18    Myer Haff 11/02/2018, 12:26 PM

## 2018-11-02 NOTE — Progress Notes (Signed)
CSW attempted to meet with MOB via bedside- [facility] representatives were in room. CSW will come back at a later time.   Cindy Howell, [place]  [phone]

## 2018-11-02 NOTE — Discharge Summary (Signed)
SVD OB Discharge Summary     Patient Name: Cindy Howell DOB: 04-24-1986 MRN: [id number]  Date of admission: 10/30/2018 Delivering MD: Waymon Amato  Date of delivery: 10/31/2018 Type of delivery: SVD  Newborn Data: Sex: Baby Female  Live born female  Birth Weight: 5 lb 13.8 oz (2660 g) APGAR: 8, 9  Newborn Delivery   Birth date/time:  10/31/2018 13:16:00 Delivery type:  Vaginal, Spontaneous     Feeding: breast Infant being discharge to home with mother in stable condition.   Admitting diagnosis: Bleeding Intrauterine pregnancy: [redacted]w[redacted]d     Secondary diagnosis:  Principal Problem:   Preterm labor Active Problems:   Hx of preeclampsia, prior pregnancy, currently pregnant   Gestational diabetes mellitus (GDM) controlled on oral hypoglycemic drug   Preterm delivery, delivered   Normal postpartum course                                Complications: None  PTL with delivery @  36.1                                                           Intrapartum Procedures: spontaneous vaginal delivery and GBS prophylaxis, placenta abruption Suspected @ delivery  Postpartum Procedures: none Complications-Operative and Postpartum: none Augmentation: AROM   History of Present Illness: [name] is a 33 y.o. female, G3P2002, who presents at [redacted]w[redacted]d weeks gestation. The patient has been followed at  [facility]  Her pregnancy has been complicated by:  Patient Active Problem List   Diagnosis Date Noted  . Normal postpartum course 11/02/2018  . History of depression 11/02/2018  . Preterm delivery, delivered 10/31/2018  . Preterm labor 10/30/2018  . Hx of preeclampsia, prior pregnancy, currently pregnant 10/30/2018  . Gestational diabetes mellitus (GDM) controlled on oral hypoglycemic drug 10/30/2018  . Melasma 09/24/2018  . Abnormal glucose tolerance test (GTT) during pregnancy, antepartum 09/08/2018  . Positive pregnancy test 04/03/2018  . History  of food allergy 03/31/2018  . Rash 03/31/2018  . 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 Itching 02/19/2018  . Weight gain 02/06/2018  . Allergic rhinitis 01/23/2018   Hospital course:  Onset of Labor With Vaginal Delivery     33 y.o. yo [id number] at [redacted]w[redacted]d was admitted in Active Labor on 10/30/2018. Patient had an uncomplicated labor course as follows:  Membrane Rupture Time/Date: 12:57 PM ,10/31/2018   Intrapartum Procedures: Episiotomy: None [1]                                         Lacerations:  None [1]  Patient had a delivery of a Viable infant. 10/31/2018  Information for the patient's newborn:  Cindy, Howell [[id number]]  Delivery Method: Vag-Spont(Filed from delivery)    Pateint had an uncomplicated postpartum course.  She is ambulating, tolerating a regular diet, passing flatus, and urinating well. Patient is discharged home in stable condition on 11/02/18.  Postpartum Day # 3 : S/P NSVD due to PTL with delivery @ 36.1, suspected placenta abruption at delivery with blood clots noted and decel Dr Cindy Howell attended the delivery, Pt had GDMA2 on glyburide during pregnancy,  was started on insulin sliding scale in labor due to elevated BS during labor post being given BMZ, sugars were elevated during postpartum period due to pt would not stop eating. FBS this morning was 80, pt stable and not requiring sliding scale coverage anymore. Pt to be f/u at 6 weeks PPV with 2 H GTT.   Patient up ad lib, denies syncope or dizziness. Reports consuming regular diet without issues and denies N/V. Patient reports 0 bowel movement + passing flatus.  Denies issues with urination and reports bleeding is "light."  Patient is breastfeeding and reports going well.  Desires IUD for postpartum contraception.  Pain is being appropriately managed with use of po meds. Pt post delivery HGB was 10.6, no s/sx of anemia. Pt is normotensive and denies HA, vision changes or RUQ pain. H/O depression, pt feeling happy, denies SI/HI.    Physical exam  Vitals:   11/01/18 0430 11/01/18 1428 11/01/18 2124 11/02/18 0522  BP: 113/81 118/84 120/87 115/89  Pulse: 68 67 68 62  Resp: 17 16 18 16   Temp: 98.1 F (36.7 C) 98.3 F (36.8 C) 98.4 F (36.9 C) 98 F (36.7 C)  TempSrc: Oral Oral Oral   SpO2: 99% 100% 100%   Weight:      Height:       General: alert, cooperative and no distress Lochia: appropriate Uterine Fundus: firm Perineum: Intact DVT Evaluation: No evidence of DVT seen on physical exam. Negative Homan's sign. No cords or calf tenderness. No significant calf/ankle edema.  Labs: Lab Results  Component Value Date   WBC 13.5 (H) 11/01/2018   HGB 10.8 (L) 11/01/2018   HCT 33.1 (L) 11/01/2018   MCV 91.7 11/01/2018   PLT 185 11/01/2018   CBG (last 3)  Recent Labs    11/01/18 1723 11/01/18 2331 11/02/18 0534  GLUCAP 144* 107* 80    No flowsheet data found.  Date of discharge: 11/02/2018 Discharge Diagnoses: Premature labor and delivery Discharge instruction: per After Visit Summary and "Baby and Me Booklet".  After visit meds:  Allergies as of 11/02/2018      Reactions   Beef-derived Products Rash   Goat-derived Products Rash   Shrimp [shellfish Allergy] Rash      Medication List    STOP taking these medications   aspirin EC 81 MG tablet   glyBURIDE 2.5 MG tablet Commonly known as:  DIABETA     TAKE these medications   EPINEPHrine 0.3 mg/0.3 mL Soaj injection Commonly known as:  EPIPEN 2-PAK Use as directed for severe allergic reactions   fluticasone 50 MCG/ACT nasal spray Commonly known as:  FLONASE Place 1 spray into both nostrils daily. 1 spray in each nostril every day   ibuprofen 600 MG tablet Commonly known as:  ADVIL,MOTRIN Take 1 tablet (600 mg total) by mouth every 6 (six) hours.   pantoprazole 40 MG tablet Commonly known as:  PROTONIX Take 40 mg by mouth daily.   Prenatal Vitamins 0.8 MG tablet Take 1 tablet by mouth daily.       Activity:            unrestricted and pelvic rest Advance as tolerated. Pelvic rest for 6 weeks.  Diet:                routine Medications: PNV Postpartum contraception: IUD Mirena Condition:  Pt discharge to home with baby in stable and condition GDMA2: F/U at [facility] 6 weeks for 2H GTT. Depression: Baby Love RN to visit home  and 2 weeks mental health F/U. Report SI/HI. Monitor for PP depression.   Meds: Allergies as of 11/02/2018      Reactions   Beef-derived Products Rash   Goat-derived Products Rash   Shrimp [shellfish Allergy] Rash      Medication List    STOP taking these medications   aspirin EC 81 MG tablet   glyBURIDE 2.5 MG tablet Commonly known as:  DIABETA     TAKE these medications   EPINEPHrine 0.3 mg/0.3 mL Soaj injection Commonly known as:  EPIPEN 2-PAK Use as directed for severe allergic reactions   fluticasone 50 MCG/ACT nasal spray Commonly known as:  FLONASE Place 1 spray into both nostrils daily. 1 spray in each nostril every day   ibuprofen 600 MG tablet Commonly known as:  ADVIL,MOTRIN Take 1 tablet (600 mg total) by mouth every 6 (six) hours.   pantoprazole 40 MG tablet Commonly known as:  PROTONIX Take 40 mg by mouth daily.   Prenatal Vitamins 0.8 MG tablet Take 1 tablet by mouth daily.       Discharge Follow Up:  Follow-up [facility] Follow up.   Specialty:  Obstetrics and Gynecology Why:  Leonides Cave RN to visit home, 2 week Mental health check with [facility] and 6 weeks PPV.  Contact information: [address] [place] [address] [place], NP-C, CNM 11/02/2018, 8:10 AM  Noralyn Pick, [place]

## 2018-11-05 ENCOUNTER — Inpatient Hospital Stay (HOSPITAL_COMMUNITY)
Admission: AD | Admit: 2018-11-05 | Discharge: 2018-11-05 | Disposition: A | Discharge status: Home / Self Care | Payer: Medicaid Other | Attending: Obstetrics and Gynecology | Admitting: Obstetrics and Gynecology

## 2018-11-05 ENCOUNTER — Encounter (HOSPITAL_COMMUNITY): Payer: Self-pay

## 2018-11-05 ENCOUNTER — Other Ambulatory Visit: Payer: Self-pay

## 2018-11-05 DIAGNOSIS — R109 Unspecified abdominal pain: Secondary | ICD-10-CM | POA: Diagnosis not present

## 2018-11-05 DIAGNOSIS — Z049 Encounter for examination and observation for unspecified reason: Secondary | ICD-10-CM

## 2018-11-05 DIAGNOSIS — O9089 Other complications of the puerperium, not elsewhere classified: Secondary | ICD-10-CM | POA: Insufficient documentation

## 2018-11-05 DIAGNOSIS — R030 Elevated blood-pressure reading, without diagnosis of hypertension: Secondary | ICD-10-CM | POA: Diagnosis present

## 2018-11-05 DIAGNOSIS — O135 Gestational [pregnancy-induced] hypertension without significant proteinuria, complicating the puerperium: Secondary | ICD-10-CM | POA: Diagnosis not present

## 2018-11-05 LAB — COMPREHENSIVE METABOLIC PANEL
ALT: 40 U/L (ref 0–44)
AST: 25 U/L (ref 15–41)
Albumin: 3 g/dL — ABNORMAL LOW (ref 3.5–5.0)
Alkaline Phosphatase: 105 U/L (ref 38–126)
Anion gap: 10 (ref 5–15)
BUN: 12 mg/dL (ref 6–20)
CO2: 22 mmol/L (ref 22–32)
Calcium: 8.4 mg/dL — ABNORMAL LOW (ref 8.9–10.3)
Chloride: 104 mmol/L (ref 98–111)
Creatinine, Ser: 0.59 mg/dL (ref 0.44–1.00)
GFR calc Af Amer: >60 mL/min (ref >60)
GFR calc non Af Amer: >60 mL/min (ref >60)
Glucose, Bld: 133 mg/dL — ABNORMAL HIGH (ref 70–99)
POTASSIUM: 3.4 mmol/L — AB (ref 3.5–5.1)
Sodium: 136 mmol/L (ref 135–145)
Total Bilirubin: 0.2 mg/dL — ABNORMAL LOW (ref 0.3–1.2)
Total Protein: 6.8 g/dL (ref 6.5–8.1)

## 2018-11-05 LAB — PROTEIN / CREATININE RATIO, URINE
Creatinine, Urine: 30 mg/dL
Total Protein, Urine: <6 mg/dL

## 2018-11-05 LAB — CBC WITH DIFFERENTIAL/PLATELET
Basophils Absolute: 0 10*3/uL (ref 0.0–0.1)
Basophils Relative: 0 %
Eosinophils Absolute: 0.2 10*3/uL (ref 0.0–0.5)
Eosinophils Relative: 3 %
HCT: 37 % (ref 36.0–46.0)
Hemoglobin: 12 g/dL (ref 12.0–15.0)
LYMPHS PCT: 30 %
Lymphs Abs: 2.7 10*3/uL (ref 0.7–4.0)
MCH: 29.9 pg (ref 26.0–34.0)
MCHC: 32.4 g/dL (ref 30.0–36.0)
MCV: 92.3 fL (ref 80.0–100.0)
Monocytes Absolute: 0.5 10*3/uL (ref 0.1–1.0)
Monocytes Relative: 5 %
NEUTROS ABS: 5.6 10*3/uL (ref 1.7–7.7)
NEUTROS PCT: 62 %
Platelets: 216 10*3/uL (ref 150–400)
RBC: 4.01 MIL/uL (ref 3.87–5.11)
RDW: 13 % (ref 11.5–15.5)
WBC: 9 10*3/uL (ref 4.0–10.5)
nRBC: 0 % (ref 0.0–0.2)

## 2018-11-05 LAB — URIC ACID: Uric Acid, Serum: 4.5 mg/dL (ref 2.5–7.1)

## 2018-11-05 LAB — LACTATE DEHYDROGENASE: LDH: 140 U/L (ref 98–192)

## 2018-11-05 MED ORDER — IBUPROFEN 600 MG PO TABS
600.0000 mg | ORAL_TABLET | Freq: Four times a day (QID) | ORAL | Status: DC | PRN
  Administered 2018-11-05: 600 mg via ORAL
  Filled 2018-11-05: qty 1

## 2018-11-05 MED ORDER — CALCIUM CARBONATE ANTACID 500 MG PO CHEW
1.0000 | CHEWABLE_TABLET | ORAL | Status: DC | PRN
  Administered 2018-11-05: 200 mg via ORAL
  Filled 2018-11-05: qty 1

## 2018-11-05 NOTE — MAU Note (Signed)
Pt states she has high blood pressure.   Pt reports 140/90 blood pressure and 152/90 at home  Pt feels pain in arms and abdomen.

## 2018-11-05 NOTE — MAU Provider Note (Signed)
History     CSN: [id number]  Arrival date and time: 11/05/18 1712      Chief Complaint  Patient presents with  . Hypertension   Patient came in because she reports not feeling well and noting higher pressures at home in the 140s and 150s over 90s. She denies headache but states she has bilateral upper gastric pain and blurred vision. She reports she is not sleeping well but has been eating well. She appears anxious. Patient has history of preeclampsia in a prior pregnancy.   Hypertension  Pertinent negatives include no headaches.    OB History    Gravida  3   Para  3   Term  3   Preterm      AB      Living  3     SAB      TAB      Ectopic      Multiple      Live Births  3           Past Medical History:  Diagnosis Date  . Gestational diabetes   . Urticaria     History reviewed. No pertinent surgical history.  History reviewed. No pertinent family history.  Social History   Tobacco Use  . Smoking status: Never Smoker  . Smokeless tobacco: Never Used  Substance Use Topics  . Alcohol use: Never    Frequency: Never  . Drug use: Never    Allergies:  Allergies  Allergen Reactions  . Beef-Derived Products Rash  . Goat-Derived Products Rash  . Shrimp [Shellfish Allergy] Rash    Medications Prior to Admission  Medication Sig Dispense Refill Last Dose  . ibuprofen (ADVIL,MOTRIN) 600 MG tablet Take 1 tablet (600 mg total) by mouth every 6 (six) hours. 30 tablet 0 11/05/2018 at Unknown time  . Prenatal Multivit-Min-Fe-FA (PRENATAL VITAMINS) 0.8 MG tablet Take 1 tablet by mouth daily. 30 tablet 6 11/05/2018 at Unknown time  . EPINEPHrine (EPIPEN 2-PAK) 0.3 mg/0.3 mL IJ SOAJ injection Use as directed for severe allergic reactions 2 Device 2 Emergency  . fluticasone (FLONASE) 50 MCG/ACT nasal spray Place 1 spray into both nostrils daily. 1 spray in each nostril every day (Patient not taking: Reported on 10/31/2018) 16 g 2 Not Taking at Unknown time  .  pantoprazole (PROTONIX) 40 MG tablet Take 40 mg by mouth daily.   Past Week at Unknown time    Review of Systems  Eyes: Positive for visual disturbance.  Gastrointestinal: Positive for abdominal pain.  Neurological: Negative for headaches.  All other systems reviewed and are negative.  Physical Exam   Vitals:   11/05/18 1846 11/05/18 1901 11/05/18 1916 11/05/18 1931  BP: 122/69 130/87 124/75 120/78  Pulse: 69 76 68 69  Resp:      Temp:      SpO2:  100%    Weight:       Results for orders placed or performed during the hospital encounter of 11/05/18 (from the past 24 hour(s))  CBC with Differential/Platelet     Status: None   Collection Time: 11/05/18  5:51 PM  Result Value Ref Range   WBC 9.0 4.0 - 10.5 K/uL   RBC 4.01 3.87 - 5.11 MIL/uL   Hemoglobin 12.0 12.0 - 15.0 g/dL   HCT 37.0 36.0 - 46.0 %   MCV 92.3 80.0 - 100.0 fL   MCH 29.9 26.0 - 34.0 pg   MCHC 32.4 30.0 - 36.0 g/dL   RDW 13.0 11.5 -  15.5 %   Platelets 216 150 - 400 K/uL   nRBC 0.0 0.0 - 0.2 %   Neutrophils Relative % 62 %   Neutro Abs 5.6 1.7 - 7.7 K/uL   Lymphocytes Relative 30 %   Lymphs Abs 2.7 0.7 - 4.0 K/uL   Monocytes Relative 5 %   Monocytes Absolute 0.5 0.1 - 1.0 K/uL   Eosinophils Relative 3 %   Eosinophils Absolute 0.2 0.0 - 0.5 K/uL   Basophils Relative 0 %   Basophils Absolute 0.0 0.0 - 0.1 K/uL  Comprehensive metabolic panel     Status: Abnormal   Collection Time: 11/05/18  5:51 PM  Result Value Ref Range   Sodium 136 135 - 145 mmol/L   Potassium 3.4 (L) 3.5 - 5.1 mmol/L   Chloride 104 98 - 111 mmol/L   CO2 22 22 - 32 mmol/L   Glucose, Bld 133 (H) 70 - 99 mg/dL   BUN 12 6 - 20 mg/dL   Creatinine, Ser 0.59 0.44 - 1.00 mg/dL   Calcium 8.4 (L) 8.9 - 10.3 mg/dL   Total Protein 6.8 6.5 - 8.1 g/dL   Albumin 3.0 (L) 3.5 - 5.0 g/dL   AST 25 15 - 41 U/L   ALT 40 0 - 44 U/L   Alkaline Phosphatase 105 38 - 126 U/L   Total Bilirubin 0.2 (L) 0.3 - 1.2 mg/dL   GFR calc non Af Amer >60 >60 mL/min    GFR calc Af Amer >60 >60 mL/min   Anion gap 10 5 - 15  Uric acid     Status: None   Collection Time: 11/05/18  5:51 PM  Result Value Ref Range   Uric Acid, Serum 4.5 2.5 - 7.1 mg/dL  Lactate dehydrogenase     Status: None   Collection Time: 11/05/18  5:51 PM  Result Value Ref Range   LDH 140 98 - 192 U/L  Protein / creatinine ratio, urine     Status: None   Collection Time: 11/05/18  6:28 PM  Result Value Ref Range   Creatinine, Urine 30.00 mg/dL   Total Protein, Urine <6 mg/dL   Protein Creatinine Ratio        0.00 - 0.15 mg/mg[Cre]   Physical Exam  Nursing note and vitals reviewed. Constitutional: She is oriented to person, place, and time. She appears well-developed and well-nourished.  HENT:  Head: Normocephalic.  Eyes: Pupils are equal, round, and reactive to light.  Cardiovascular: Normal rate, regular rhythm and normal heart sounds.  Respiratory: Effort normal and breath sounds normal. No respiratory distress. She has no wheezes.  GI: Soft. Bowel sounds are normal. She exhibits no distension and no mass. There is abdominal tenderness. There is no guarding.  Musculoskeletal: Normal range of motion.  Neurological: She is alert and oriented to person, place, and time.  Skin: Skin is warm and dry.  Psychiatric: She has a normal mood and affect. Her behavior is normal. Judgment and thought content normal.    MAU Course  Procedures Preeclampsia labs  MDM All Blood work negative. PCR negative. Normotensive. Not preeclamptic.  I took over care from [place], [place] at shift change. Ms. Gagen, her husband, and baby Lear Ng are present today. Pt is cheerful and cooperative.Discussed variety of patient concerns with patient & partner at lengths including: Antecubital IV site: no redness, induration, heat, bruise. Small puncture site tender to touch. Normal Breasts with firm ducts: demonstrated hand expression. Pt has copious milk supply and milk ejected easily.  Discussed warm showers  and removal of milk to soften ducts. Normal. Problems with [age] year old at home: [age] year old crying, difficulty feeding. Discussed separation from mom during labor and postpartum and adjustment to new baby at home, trying to provide more attention and talking to pediatrician about concerns  Assessment and Plan  Isolated elevated blood pressure at home Discharge home in stable condition. Pt will follow up at clinic Mon or Tues., check on BP, mood.   Altha Harm 11/05/2018, 7:57 PM

## 2018-11-10 DIAGNOSIS — K219 Gastro-esophageal reflux disease without esophagitis: Secondary | ICD-10-CM | POA: Diagnosis not present

## 2018-11-10 DIAGNOSIS — Z0130 Encounter for examination of blood pressure without abnormal findings: Secondary | ICD-10-CM | POA: Diagnosis not present

## 2018-11-10 DIAGNOSIS — Z3040 Encounter for surveillance of contraceptives, unspecified: Secondary | ICD-10-CM | POA: Diagnosis not present

## 2018-11-10 DIAGNOSIS — Z8759 Personal history of other complications of pregnancy, childbirth and the puerperium: Secondary | ICD-10-CM | POA: Diagnosis not present

## 2018-11-20 ENCOUNTER — Telehealth (HOSPITAL_COMMUNITY): Payer: Self-pay

## 2018-11-23 NOTE — Telephone Encounter (Signed)
Mom wanting to get electric breast pump from [place] for 30.00.  Took the formula package from [facility] and had to give DEBP back.  Explained that it was just for 2 weeks and she would have had to have done that in hospital.  Mom has manual pump for home use.  Urged her to call lactation as needed.

## 2018-11-24 DIAGNOSIS — B373 Candidiasis of vulva and vagina: Secondary | ICD-10-CM | POA: Diagnosis not present

## 2018-11-24 DIAGNOSIS — Z124 Encounter for screening for malignant neoplasm of cervix: Secondary | ICD-10-CM | POA: Diagnosis not present

## 2018-11-24 DIAGNOSIS — K219 Gastro-esophageal reflux disease without esophagitis: Secondary | ICD-10-CM | POA: Diagnosis not present

## 2018-11-24 DIAGNOSIS — B356 Tinea cruris: Secondary | ICD-10-CM | POA: Diagnosis not present

## 2018-11-24 DIAGNOSIS — L293 Anogenital pruritus, unspecified: Secondary | ICD-10-CM | POA: Diagnosis not present

## 2018-11-24 DIAGNOSIS — Z113 Encounter for screening for infections with a predominantly sexual mode of transmission: Secondary | ICD-10-CM | POA: Diagnosis not present

## 2018-11-24 DIAGNOSIS — N898 Other specified noninflammatory disorders of vagina: Secondary | ICD-10-CM | POA: Diagnosis not present

## 2018-11-24 LAB — HM PAP SMEAR: HM Pap smear: NEGATIVE

## 2018-12-03 DIAGNOSIS — F4321 Adjustment disorder with depressed mood: Secondary | ICD-10-CM | POA: Diagnosis not present

## 2018-12-10 DIAGNOSIS — Z30430 Encounter for insertion of intrauterine contraceptive device: Secondary | ICD-10-CM | POA: Diagnosis not present

## 2018-12-11 DIAGNOSIS — F4321 Adjustment disorder with depressed mood: Secondary | ICD-10-CM | POA: Diagnosis not present

## 2018-12-18 ENCOUNTER — Telehealth: Payer: Self-pay

## 2018-12-18 NOTE — Telephone Encounter (Signed)
Pt called nurse line stating she has tooth decay that is causing a lot of pain. Pt stated her dentist is closed due to [place]. Pt is requesting an antibiotic and something for pain. Please advise.

## 2018-12-21 ENCOUNTER — Telehealth (INDEPENDENT_AMBULATORY_CARE_PROVIDER_SITE_OTHER): Payer: Medicaid Other | Admitting: Family Medicine

## 2018-12-21 ENCOUNTER — Other Ambulatory Visit: Payer: Self-pay

## 2018-12-21 DIAGNOSIS — K047 Periapical abscess without sinus: Secondary | ICD-10-CM

## 2018-12-21 MED ORDER — IBUPROFEN 600 MG PO TABS: 600.0000 mg | ORAL_TABLET | Freq: Three times a day (TID) | ORAL | 0 refills | Status: DC | PRN

## 2018-12-21 MED ORDER — AMOXICILLIN-POT CLAVULANATE 875-125 MG PO TABS: 1.0000 | ORAL_TABLET | Freq: Two times a day (BID) | ORAL | 0 refills | Status: DC

## 2018-12-21 NOTE — Progress Notes (Signed)
[facility] Telemedicine Visit  Patient consented to have visit conducted via telephone. Patient declined use of an interpreter.  Encounter participants: Patient: Cindy Howell  Provider: Chrisandra Netters  Others (if applicable): n/a  Chief Complaint: tooth pain  HPI:  Patient reports tooth pain for about a month. Typically just sensitivity, sometimes a little bit of pain. Now has severe pain in one tooth for the last several days. Having pain also in head, ear, throat. No swelling. Eating and drinking well. No fever. Taking ibuprofen 600mg  twice daily. Is both breast and bottle feeding, recently had baby. Has tried to get in with dentist but they are closed due to [place] pandemic.   ROS: +tooth pain, no fever  Pertinent PMHx: recent GDM, food allergy, no antibiotic allergies  Exam:  Respiratory: Patient speaking normally in full sentences throughout the phone encounter, without any respiratory distress evident.   Assessment/Plan:  Dental pain - likely dental infection. No swelling or fever to raise concern for systemic illness. Tolerating PO. Speech is fluent over the phone. Will treat with augmentin & ibuprofen. Encouraged patient to see dentist for definitive management. Discussed she can call back with any questions.   Encouraged patient to stay home as much as possible to avoid exposure to COVID. Educated on emphasis of telehealth right now to minimize risk of disease transmission. Advised she call us with any concerns or needs.   Patient appreciative & agreeable.  Time spent on phone with patient: 7 minutes

## 2018-12-22 ENCOUNTER — Telehealth: Payer: Self-pay | Admitting: Family Medicine

## 2018-12-22 DIAGNOSIS — K0889 Other specified disorders of teeth and supporting structures: Secondary | ICD-10-CM

## 2018-12-22 MED ORDER — AMOXICILLIN 500 MG PO TABS: 500.0000 mg | ORAL_TABLET | Freq: Three times a day (TID) | ORAL | 0 refills | Status: DC

## 2018-12-22 NOTE — Telephone Encounter (Signed)
Patient called saying she was able to get in with a dentist yesterday. The dentist saw her and recommended she see an oral surgeon. They gave her an rx for amoxicillin 500mg  q8h #30 tabs. Patient is requesting this be sent electronically to [facility], as the dentist had to give her a paper prescription. She did NOT pick up the augmentin I had sent in.  Actions taken: - called [facility] and canceled augmentin rx from yesterday - sent in new rx for amoxicillin - placed referral to [facility]  Patient appreciative. Leeanne Rio, MD

## 2018-12-24 ENCOUNTER — Telehealth (INDEPENDENT_AMBULATORY_CARE_PROVIDER_SITE_OTHER): Payer: Medicaid Other | Admitting: Family Medicine

## 2018-12-24 DIAGNOSIS — T63301A Toxic effect of unspecified spider venom, accidental (unintentional), initial encounter: Secondary | ICD-10-CM | POA: Diagnosis not present

## 2018-12-24 NOTE — Telephone Encounter (Signed)
[facility] Telemedicine Visit  Patient consented to have visit conducted via telephone.  Encounter participants: Patient: Cindy Howell  Provider: Sierra Bing   Chief Complaint: Spider bite  HPI:  Patient reports being bit by a very small brown spider on the dorsal left wrist this morning around 0850 while lying in bed. She and her husband saw the spider in the sheets. She reports some minimal edema and redness at the site. Denies any discharge or rapid changes to her skin, SOB. She has applied a topical OTC abx to the area.   ROS: See HPI.  Pertinent PMHx: Reviewed  Exam:  Respiratory: talking in complete sentences.  Assessment/Plan:  Spider bite Acute. Uncertain if this was a brown recluse. No rapid changes to skin per patient or respiratory symptoms.  - Advised to mark the erythema and careful observation over the next few hours along with conservative management with cold compress and continued use of topical OTC abx - Reviewed return precautions    Time spent on phone with patient: 6 minutes

## 2018-12-24 NOTE — Assessment & Plan Note (Signed)
Acute. Uncertain if this was a brown recluse. No rapid changes to skin per patient or respiratory symptoms.  - Advised to mark the erythema and careful observation over the next few hours along with conservative management with cold compress and continued use of topical OTC abx - Reviewed return precautions

## 2018-12-29 DIAGNOSIS — Z8632 Personal history of gestational diabetes: Secondary | ICD-10-CM | POA: Diagnosis not present

## 2019-01-05 DIAGNOSIS — N939 Abnormal uterine and vaginal bleeding, unspecified: Secondary | ICD-10-CM | POA: Diagnosis not present

## 2019-01-05 DIAGNOSIS — Z30431 Encounter for routine checking of intrauterine contraceptive device: Secondary | ICD-10-CM | POA: Diagnosis not present

## 2019-01-05 DIAGNOSIS — R102 Pelvic and perineal pain: Secondary | ICD-10-CM | POA: Diagnosis not present

## 2019-01-07 DIAGNOSIS — Z5181 Encounter for therapeutic drug level monitoring: Secondary | ICD-10-CM | POA: Diagnosis not present

## 2019-01-12 ENCOUNTER — Ambulatory Visit: Payer: Medicaid Other

## 2019-01-12 DIAGNOSIS — Z5181 Encounter for therapeutic drug level monitoring: Secondary | ICD-10-CM | POA: Diagnosis not present

## 2019-01-13 ENCOUNTER — Encounter: Payer: Self-pay | Admitting: Family Medicine

## 2019-01-13 ENCOUNTER — Ambulatory Visit (INDEPENDENT_AMBULATORY_CARE_PROVIDER_SITE_OTHER): Payer: Medicaid Other | Admitting: Family Medicine

## 2019-01-13 ENCOUNTER — Other Ambulatory Visit: Payer: Self-pay

## 2019-01-13 VITALS — BP 116/82 | HR 105 | Wt 176.0 lb

## 2019-01-13 DIAGNOSIS — O9279 Other disorders of lactation: Secondary | ICD-10-CM | POA: Diagnosis not present

## 2019-01-13 NOTE — Progress Notes (Signed)
Subjective:    Cindy Howell is a 33 y.o. female who presents to [facility] today for breast cyst:  1.  Breast cyst: Patient delivered daughter about 2 months ago.  Starting about a week ago she noticed cyst in her left breast.  She has had decreased milk production with that breast for the same period of time.  Initially it was a "knot" below her left nipple.  He was swollen and painful for 1 to 2 days and that has gradually been decreasing in size since that time.  Is not been pain for the past several days.  She is had no drainage from the area.  No fevers or chills.  She is continue to breast-feed and supplement with formula feeds.   ROS as above per HPI.  Pertinently, no chest pain, palpitations, SOB, Fever, Chills, Abd pain, N/V/D.   The following portions of the patient's history were reviewed and updated as appropriate: allergies, current medications, past medical history, family and social history, and problem list. Patient is a nonsmoker.    PMH reviewed.  Past Medical History:  Diagnosis Date  . Gestational diabetes   . Urticaria    No past surgical history on file.  Medications reviewed. Current Outpatient Medications  Medication Sig Dispense Refill  . amoxicillin (AMOXIL) 500 MG tablet Take 1 tablet (500 mg total) by mouth every 8 (eight) hours. 30 tablet 0  . EPINEPHrine (EPIPEN 2-PAK) 0.3 mg/0.3 mL IJ SOAJ injection Use as directed for severe allergic reactions 2 Device 2  . fluticasone (FLONASE) 50 MCG/ACT nasal spray Place 1 spray into both nostrils daily. 1 spray in each nostril every day (Patient not taking: Reported on 10/31/2018) 16 g 2  . ibuprofen (ADVIL,MOTRIN) 600 MG tablet Take 1 tablet (600 mg total) by mouth every 8 (eight) hours as needed for moderate pain. 30 tablet 0  . pantoprazole (PROTONIX) 40 MG tablet Take 40 mg by mouth daily.    . Prenatal Multivit-Min-Fe-FA (PRENATAL VITAMINS) 0.8 MG tablet Take 1 tablet by mouth daily. 30 tablet 6   No current  facility-administered medications for this visit.      Objective:   Physical Exam BP 116/82   Pulse (!) 105   Wt 176 lb (79.8 kg)   SpO2 99%   BMI 30.21 kg/m  Gen:  Alert, cooperative patient who appears stated age in no acute distress.  Vital signs reviewed. HEENT: EOMI,  MMM Breast: Right breast within normal limits.  Left breast normal except for 1 cm in diameter cyst located about 6:00 area, directly inferior to nipple.  This is easily mobile.  Nonpainful.  Rubbery and squishy. Lymph: No axillary lymphadenopathy or nodules noted bilaterally.  Impression/plan: 1.  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 -Treating as such. -Much less likely to be cancer.  Only present but we can already decreasing in size.  She has no axillary lymphadenopathy or other masses or cysts noted. -If becomes painful at that point may be infected.  No evidence of infection currently.  To continue to allow body to resorb. -Warning precautions provided. -If no improvement in the next month to return for further evaluation recommendations.  If persists may need ultrasound

## 2019-01-13 NOTE — Patient Instructions (Signed)
You have a retention cyst in your breast.  This is caused by a clogged duct, or clogged milk.   Your body will continue to absorb this fluid.    This is one reason you have had less milk from that breast.  If the area starts to increase in size, pain, or become red and hot, come back and let us know.

## 2019-01-14 ENCOUNTER — Ambulatory Visit: Payer: Medicaid Other

## 2019-01-19 ENCOUNTER — Other Ambulatory Visit: Payer: Self-pay

## 2019-01-19 ENCOUNTER — Telehealth (INDEPENDENT_AMBULATORY_CARE_PROVIDER_SITE_OTHER): Payer: Medicaid Other | Admitting: Family Medicine

## 2019-01-19 DIAGNOSIS — K0510 Chronic gingivitis, plaque induced: Secondary | ICD-10-CM | POA: Diagnosis not present

## 2019-01-19 DIAGNOSIS — Z5181 Encounter for therapeutic drug level monitoring: Secondary | ICD-10-CM | POA: Diagnosis not present

## 2019-01-19 MED ORDER — CHLORHEXIDINE GLUCONATE 0.12 % MT SOLN: 15.0000 mL | Freq: Two times a day (BID) | OROMUCOSAL | 0 refills | Status: DC

## 2019-01-19 NOTE — Progress Notes (Addendum)
[facility] Telemedicine Visit  Patient consented to have virtual visit. Method of visit: Video  Encounter participants: Patient: Cindy Howell - located at home Provider: Bonnita Hollow - located at office Others (if applicable):   Chief Complaint: Sore gums  HPI: Patient complaining of sore gums on the lower anterior portion of her mouth.  This is not associated with any canker sores or anything else.  Is been going on for 3 to 4 days.  Is worse with brushing or spicy food.  Nothing seems to make it better.  The pain is not very severe.  Patient did not give a number associated with this.  Patient does have some bleeding especially when brushing.  This is only located on the lower portion of the gums and nowhere else.  Patient's been able tolerate p.o.  without issue.  Patient endorses brushing her teeth every day.  Did not endorse flossing.  ROS: per HPI  Pertinent PMHx:  Noncontributory  Exam:  General: Talking on phone, no acute distress HEENT: Moist mucous membranes, atraumatic, normocephalic, gums are beefy red on the lower portion of anterior teeth, no canker sores or other mucocutaneous lesions are noted, nevus located under bottom lip Respiratory: Talking in full sentences without having to stop for breath  Assessment/Plan: Patient presents with localized case of gingivitis without other periodontal dental disease.  Recommend aggressive oral hygiene with brushing twice daily, flossing at least once a day, and chlorhexidine rinse.  Also will refer to dentistry for further ongoing issues.  May take Tylenol or ibuprofen for pain. Problem List Items Addressed This Visit    None    Visit Diagnoses    Gingivitis    -  Primary   Relevant Medications   chlorhexidine (PERIDEX) 0.12 % solution   Other Relevant Orders   Ambulatory referral to Dentistry

## 2019-01-23 DIAGNOSIS — F4321 Adjustment disorder with depressed mood: Secondary | ICD-10-CM | POA: Diagnosis not present

## 2019-01-26 DIAGNOSIS — Z5181 Encounter for therapeutic drug level monitoring: Secondary | ICD-10-CM | POA: Diagnosis not present

## 2019-02-01 ENCOUNTER — Telehealth (INDEPENDENT_AMBULATORY_CARE_PROVIDER_SITE_OTHER): Payer: Medicaid Other | Admitting: Family Medicine

## 2019-02-01 ENCOUNTER — Other Ambulatory Visit: Payer: Self-pay

## 2019-02-01 DIAGNOSIS — S90851A Superficial foreign body, right foot, initial encounter: Secondary | ICD-10-CM

## 2019-02-01 DIAGNOSIS — M79671 Pain in right foot: Secondary | ICD-10-CM | POA: Diagnosis not present

## 2019-02-01 DIAGNOSIS — W25XXXA Contact with sharp glass, initial encounter: Secondary | ICD-10-CM

## 2019-02-01 NOTE — Progress Notes (Signed)
[facility] Visit  Patient consented to have virtual visit. Method of visit: Telephone  Encounter participants: Patient: Cindy Howell - located at home [phone]) Provider: Daisy Floro - located at clinic Others (if applicable): Interpreter  Chief Complaint: Glass in foot  HPI: Patient is a 33 year old female who was called at home (did not want to come into the clinic) for complaint of "Glass in foot". The patient reports that 2 days ago she stepped out of her bed onto the floor and felt a pain in her right heel, where she found a small piece of glass stuck in her right heel. She is unsure if any glass is still inside of her foot. She describes the wound as 66mm in size on the bottom of her heel. She reports the heel is not actively bleeding or having any drainage, she denies any appearance of infection, denies fevers and weakness in her foot or toes. She only reports having heel pain with ambulation.   ROS: per HPI  Pertinent PMHx: None  Exam:  Respiratory: No   Assessment/Plan: No problem-specific Assessment & Plan notes found for this encounter. - Tylenol 500mg  - 1000mg  no more than 4 times daily (patient does not want to use Ibuprofen due to fear COVID) - Keep the foot elevated to decrease inflammation - Wash the foot with soap and water - Keep the wound covered with bacitracin or Neosporin and a bandage - If the pain increases, the wound turns purple/yellow/green (appears infected), starts to have purulent drainage, or if the patient develops a fever, the patient was instructed to   Time spent during visit with patient: 27:07 minutes

## 2019-02-02 DIAGNOSIS — Z5181 Encounter for therapeutic drug level monitoring: Secondary | ICD-10-CM | POA: Diagnosis not present

## 2019-02-08 DIAGNOSIS — Z5181 Encounter for therapeutic drug level monitoring: Secondary | ICD-10-CM | POA: Diagnosis not present

## 2019-02-09 ENCOUNTER — Telehealth (INDEPENDENT_AMBULATORY_CARE_PROVIDER_SITE_OTHER): Payer: Medicaid Other | Admitting: Family Medicine

## 2019-02-09 ENCOUNTER — Other Ambulatory Visit: Payer: Self-pay

## 2019-02-09 DIAGNOSIS — K219 Gastro-esophageal reflux disease without esophagitis: Secondary | ICD-10-CM | POA: Insufficient documentation

## 2019-02-09 DIAGNOSIS — R12 Heartburn: Secondary | ICD-10-CM | POA: Diagnosis not present

## 2019-02-09 MED ORDER — OMEPRAZOLE 40 MG PO CPDR: 40.0000 mg | DELAYED_RELEASE_CAPSULE | Freq: Every day | ORAL | 0 refills | Status: DC

## 2019-02-09 NOTE — Assessment & Plan Note (Signed)
Clinically c/w symptoms of GERD similar to prior episode.  She reports no relief with OTC Tums however has found omeprazole helpful in the past.  No red flags per history.   -Rx: omeprazole 40 mg  -Advised to avoid eating late meals -Avoid hot spicy foods and alcohol, caffeine, chocolate as these can exacerbate symptoms Follow up if symptoms worsen or do not improve

## 2019-02-09 NOTE — Patient Instructions (Signed)
Heartburn  Heartburn is a type of pain or discomfort that can happen in the throat or chest. It is often described as a burning pain. It may also cause a bad, acid-like taste in the mouth. Heartburn may feel worse when you lie down or bend over. It may be worse at night. It may be caused by stomach contents that move back up (reflux) into the tube that connects the mouth with the stomach (esophagus).  Follow these instructions at home:  Eating and drinking     Avoid certain foods and drinks as told by your doctor. This may include:  ? Coffee and tea (with or without caffeine).  ? Drinks that have alcohol.  ? Energy drinks and sports drinks.  ? Carbonated drinks or sodas.  ? Chocolate and cocoa.  ? Peppermint and mint flavorings.  ? Garlic and onions.  ? Horseradish.  ? Spicy and acidic foods, such as:   Peppers.   Chili powder and curry powder.   Vinegar.   Hot sauces and BBQ sauce.  ? Citrus fruit juices and citrus fruits, such as:   Oranges.   Lemons.   Limes.  ? Tomato-based foods, such as:   Red sauce and pizza with red sauce.   Chili.   Salsa.  ? Fried and fatty foods, such as:   Donuts.   French fries and potato chips.   High-fat dressings.  ? High-fat meats, such as:   Hot dogs and sausage.   Rib eye steak.   Ham and bacon.  ? High-fat dairy items, such as:   Whole milk.   Butter.   Cream cheese.   Eat small meals often. Avoid eating large meals.   Avoid drinking large amounts of liquid with your meals.   Avoid eating meals during the 2-3 hours before bedtime.   Avoid lying down right after you eat.   Do not exercise right after you eat.  Lifestyle          If you are overweight, lose an amount of weight that is healthy for you. Ask your doctor about a safe weight loss goal.   Do not use any products that contain nicotine or tobacco, including cigarettes, e-cigarettes, and chewing tobacco. These can make your symptoms worse. If you need help quitting, ask your doctor.   Wear loose  clothes. Do not wear anything tight around your waist.   Raise (elevate) the head of your bed about 6 inches (15 cm) when you sleep.   Try to lower your stress. If you need help doing this, ask your doctor.  General instructions   Pay attention to any changes in your symptoms.   Take over-the-counter and prescription medicines only as told by your doctor.  ? Do not take aspirin, ibuprofen, or other NSAIDs unless your doctor says it is okay.  ? Stop medicines only as told by your doctor.   Keep all follow-up visits as told by your doctor. This is important.  Contact a doctor if:   You have new symptoms.   You lose weight and you do not know why it is happening.   You have trouble swallowing, or it hurts to swallow.   You have wheezing or a cough that keeps happening.   Your symptoms do not get better with treatment.   You have heartburn often for more than 2 weeks.  Get help right away if:   You have pain in your arms, neck, jaw, teeth, or back.     You feel sweaty, dizzy, or light-headed.   You have chest pain or shortness of breath.   You throw up (vomit) and your throw up looks like blood or coffee grounds.   Your poop (stool) is bloody or black.  These symptoms may represent a serious problem that is an emergency. Do not wait to see if the symptoms will go away. Get medical help right away. Call your local emergency services (911 in the [place] Do not drive yourself to the hospital.  Summary   Heartburn is a type of pain that can happen in the throat or chest. It can feel like a burning pain. It may also cause a bad, acid-like taste in the mouth.   You may need to avoid certain foods and drinks to help your symptoms. Ask your doctor what foods and drinks you should avoid.   Take over-the-counter and prescription medicines only as told by your doctor. Do not take aspirin, ibuprofen, or other NSAIDs unless your doctor told you to do so.   Contact your doctor if your symptoms do not get better or  they get worse.  This information is not intended to replace advice given to you by your health care provider. Make sure you discuss any questions you have with your health care provider.  Document Released: 05/15/2011 Document Revised: 02/02/2018 Document Reviewed: 02/02/2018  Elsevier Interactive Patient Education  2019 [employer] Inc[employer]

## 2019-02-09 NOTE — Progress Notes (Addendum)
[facility] Telemedicine Visit  Patient consented to have virtual visit. Method of visit: Video was attempted, but technology challenges prevented patient from using video, so visit was conducted via telephone.  Encounter participants: Patient: Cindy Howell - located at home Provider: Lovenia Kim - located at home Others (if applicable): N/A  Chief Complaint: heartburn  HPI:  C/o heartburn which began following delivery of her baby in February 2020.  She has experienced similar ingestion symptoms after having her previous child in the past.  She notices symptoms especially after eating "rich foods" such as rice and fish, spicy and oily foods. Symptoms are worse at bedtime.  Not associated with pain but is a burning sensation. She has tried OTC Tums but did not provide her any relief.  Was prescribed omeprazole by her OB after last baby which helped her symptoms greatly but she has run out of this.  She reports she eats dinner late most nights and lays down following large meals.   She does not drink coffee but has tea with milk daily.  No alcohol or tobacco use.  No difficulty swallowing food or drinks.   No fever, chills, chest pain, shortness of breath, abdominal pain or cough.     ROS: per HPI  PMHx: h/o GDM, allergic rhinitis  Exam:  General: 33 yo female, NAD  Respiratory: speaking comfortably in full sentences   Assessment/Plan:  GERD (gastroesophageal reflux disease) Clinically c/w symptoms of GERD similar to prior episode.  She reports no relief with OTC Tums however has found omeprazole helpful in the past.  No red flags per history.   -Rx: omeprazole 40 mg  -Advised to avoid eating late meals -Avoid hot spicy foods and alcohol, caffeine, chocolate as these can exacerbate symptoms Follow up if symptoms worsen or do not improve     Time spent during visit with patient: 15 minutes

## 2019-02-18 DIAGNOSIS — Z5181 Encounter for therapeutic drug level monitoring: Secondary | ICD-10-CM | POA: Diagnosis not present

## 2019-02-19 NOTE — Progress Notes (Signed)
Needed charge capture.   Milus Banister, [place], PGY-1 02/19/2019 11:56 AM

## 2019-02-22 DIAGNOSIS — Z5181 Encounter for therapeutic drug level monitoring: Secondary | ICD-10-CM | POA: Diagnosis not present

## 2019-02-25 ENCOUNTER — Telehealth (INDEPENDENT_AMBULATORY_CARE_PROVIDER_SITE_OTHER): Payer: Medicaid Other | Admitting: Family Medicine

## 2019-02-25 ENCOUNTER — Other Ambulatory Visit: Payer: Self-pay

## 2019-02-25 DIAGNOSIS — M549 Dorsalgia, unspecified: Secondary | ICD-10-CM

## 2019-02-25 MED ORDER — IBUPROFEN 600 MG PO TABS: 600.0000 mg | ORAL_TABLET | Freq: Three times a day (TID) | ORAL | 0 refills | Status: DC | PRN

## 2019-02-25 MED ORDER — METHOCARBAMOL 500 MG PO TABS: 500.0000 mg | ORAL_TABLET | Freq: Three times a day (TID) | ORAL | 0 refills | Status: AC | PRN

## 2019-02-25 NOTE — Progress Notes (Signed)
[facility] Telemedicine Visit  Patient consented to have virtual visit. Method of visit: Video  Encounter participants: Patient: Cindy Howell - located at Home Provider: Nuala Alpha - located at [facility] Others (if applicable): none  Chief Complaint: Back Pain  HPI: Patient is reporting pain in the left side of her upper back. She has had this pain for the past 2 months and it does not appear to be getting better. She states it is worse with standing and with movement of her arm and better with rest. When she lifts heavy objects or bending over it tends to make it worse as well. She has tried OTC Ibuprofen with some relief but is concerned because it keeps coming back. It does not radiate anywhere and is described as an achey sharp pain with some "burning".  ROS: per HPI  Pertinent PMHx: None  Exam:  Respiratory: NWOB Skin: No rashes MSK: Posterior Right Shoulder: FROM, no rashes, no obvious deformity.  Assessment/Plan:  Back pain Patient most likely has an acute muscle sprain/strain in the scapular region; most likely the trapezius muscle. Given that it has gone on for 2 months and her home attempts to get relief have been unsuccessful I will recommend formal PT. - Formal PT for 4-6 weeks - Robaxin prn for muscle stiffness - Ibuprofen 600mg  prn for pain    Time spent during visit with patient: >15 min minutes

## 2019-02-26 DIAGNOSIS — M545 Low back pain, unspecified: Secondary | ICD-10-CM | POA: Insufficient documentation

## 2019-02-26 DIAGNOSIS — M549 Dorsalgia, unspecified: Secondary | ICD-10-CM | POA: Insufficient documentation

## 2019-02-26 NOTE — Assessment & Plan Note (Signed)
Patient most likely has an acute muscle sprain/strain in the scapular region; most likely the trapezius muscle. Given that it has gone on for 2 months and her home attempts to get relief have been unsuccessful I will recommend formal PT. - Formal PT for 4-6 weeks - Robaxin prn for muscle stiffness - Ibuprofen 600mg  prn for pain

## 2019-03-01 DIAGNOSIS — Z5181 Encounter for therapeutic drug level monitoring: Secondary | ICD-10-CM | POA: Diagnosis not present

## 2019-03-05 ENCOUNTER — Ambulatory Visit: Payer: Medicaid Other | Admitting: Physical Therapy

## 2019-03-08 DIAGNOSIS — Z5181 Encounter for therapeutic drug level monitoring: Secondary | ICD-10-CM | POA: Diagnosis not present

## 2019-03-09 ENCOUNTER — Ambulatory Visit: Payer: Medicaid Other | Admitting: Physical Therapy

## 2019-03-11 ENCOUNTER — Ambulatory Visit: Payer: Medicaid Other | Attending: Family Medicine | Admitting: Physical Therapy

## 2019-03-11 ENCOUNTER — Encounter: Payer: Self-pay | Admitting: Physical Therapy

## 2019-03-11 ENCOUNTER — Other Ambulatory Visit: Payer: Self-pay

## 2019-03-11 DIAGNOSIS — R293 Abnormal posture: Secondary | ICD-10-CM | POA: Insufficient documentation

## 2019-03-11 DIAGNOSIS — M25512 Pain in left shoulder: Secondary | ICD-10-CM | POA: Diagnosis not present

## 2019-03-11 DIAGNOSIS — M6281 Muscle weakness (generalized): Secondary | ICD-10-CM | POA: Diagnosis not present

## 2019-03-11 DIAGNOSIS — M549 Dorsalgia, unspecified: Secondary | ICD-10-CM | POA: Diagnosis not present

## 2019-03-11 DIAGNOSIS — G8929 Other chronic pain: Secondary | ICD-10-CM | POA: Diagnosis not present

## 2019-03-11 NOTE — Therapy (Signed)
[place], [place], [address] Phone: [phone]   Fax:  [phone]  Physical Therapy Evaluation  Patient Details  Name: Cindy Howell MRN: [id number] Date of Birth: 1985-10-30 Referring Provider (PT): Kinnie Feil, MD    Encounter Date: 03/11/2019  PT End of Session - 03/11/19 0952    Visit Number  1    Number of Visits  13    Date for PT Re-Evaluation  05/06/19    Authorization Type  MCD: Re-submit at 4th visit    PT Start Time  0955    PT Stop Time  1042    PT Time Calculation (min)  47 min    Activity Tolerance  Patient tolerated treatment well    Behavior During Therapy  [facility] for tasks assessed/performed       Past Medical History:  Diagnosis Date  . Allergy    goat meat, pollen  . Gestational diabetes   . Urticaria     History reviewed. No pertinent surgical history.  There were no vitals filed for this visit.   Subjective Assessment - 03/11/19 0958    Subjective  pt is a 33 y.o F with CC of thoracic/ back and scapular pain pain that started 4 months ago started since she had her baby. pt reports worse with holding the L hand it caused pain. She reports pain refers down the L shoulder along. since onset the pain is worsening. pt denies HA, .    Limitations  Lifting    Patient Stated Goals  to remove the pain, to hold child and childs car seat    Currently in Pain?  Yes    Pain Score  7    at worst 6-7/10   Pain Location  Shoulder    Pain Orientation  Left    Pain Descriptors / Indicators  Burning;Sore    Pain Type  Chronic pain    Pain Onset  More than a month ago    Pain Frequency  Intermittent    Aggravating Factors   holding baby, lifting or house hold work    Pain Relieving Factors  massage, hot water, and menthol         [facility] PT Assessment - 03/11/19 0954      Assessment   Medical Diagnosis  Acute back pain, unspecified back location, unspecified back pain  laterality     Referring Provider (PT)  Kinnie Feil, MD     Onset Date/Surgical Date  --   4 months ago   Hand Dominance  Right    Next MD Visit  make one PRN    Prior Therapy  yes      Precautions   Precautions  None      Restrictions   Weight Bearing Restrictions  No      Balance Screen   Has the patient fallen in the past 6 months  No      [place] residence    Living Arrangements  Children;Spouse/significant other    Available Help at Discharge  Family    Type of [place] to enter    Entrance Stairs-Number of Steps  16    Entrance Stairs-Rails  Right   ascending   Home Layout  One level    Home Equipment  None      Prior Function   Level of Independence  Independent with  basic ADLs    Vocation  Unemployed    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      Cognition   Overall Cognitive Status  Within Functional Limits for tasks assessed      Posture/Postural Control   Posture/Postural Control  Postural limitations    Postural Limitations  Rounded Shoulders      ROM / Strength   AROM / PROM / Strength  AROM;PROM;Strength      AROM   Overall AROM Comments  lumbar and thoracic motions WFL   and shoulder ROM WFL   AROM Assessment Site  Lumbar;Shoulder    Right/Left Shoulder  Right;Left      Strength   Strength Assessment Site  Hip;Knee;Shoulder    Right/Left Shoulder  Right;Left    Right Shoulder Flexion  4+/5    Right Shoulder Extension  4+/5    Right Shoulder ABduction  4+/5    Right Shoulder Internal Rotation  5/5    Right Shoulder External Rotation  5/5    Left Shoulder Flexion  4/5    Left Shoulder Extension  4/5    Left Shoulder ABduction  4/5    Left Shoulder Internal Rotation  4+/5    Left Shoulder External Rotation  5/5    Right/Left Hip  Right;Left      Palpation   Palpation comment  TTP along Rhomboids on the L and upper trapezius      Special Tests    Special  Tests  Lumbar;Sacrolliac Tests                Objective measurements completed on examination: See above findings.      [facility] Adult PT Treatment/Exercise - 03/11/19 0954      Exercises   Exercises  Shoulder      Shoulder Exercises: Standing   External Rotation  Strengthening;10 reps;Theraband    Theraband Level (Shoulder External Rotation)  Level 3 (Green)    Internal Rotation  Strengthening;Left;10 reps;Theraband    Theraband Level (Shoulder Internal Rotation)  Level 3 (Green)    Row  Strengthening;10 reps;Both;Theraband    Theraband Level (Shoulder Row)  Level 3 (Green)      Shoulder Exercises: Stretch   Other Shoulder Stretches  upper trap 2 x 30             PT Education - 03/11/19 0952    Education Details  evaluation findings, POC, goals, HEP with proper form   increased time needed to review HEP   Person(s) Educated  Patient    Methods  Explanation;Verbal cues;Handout    Comprehension  Verbalized understanding;Verbal cues required       PT Short Term Goals - 03/11/19 1051      PT SHORT TERM GOAL #1   Title  pt to be I with inital HEP    Baseline  no previous HEP    Time  3    Period  Weeks    Status  New    Target Date  04/01/19      PT SHORT TERM GOAL #2   Title  pt to verbalize/ demo proper posture and liftingmechanics to reduce and prevent shoulder pain    Baseline  no previous knowledge of posture    Time  3    Period  Weeks    Status  New    Target Date  04/01/19        PT Long Term Goals - 03/11/19 1052      PT  LONG TERM GOAL #1   Title  pt to increase L shoulder strength to >/= 4+/5 in all planes to promote shoulder stability and biomechanics    Baseline  L shoulder grossly 4/5    Time  6    Period  Weeks    Status  New    Target Date  05/06/19      PT LONG TERM GOAL #2   Title  pt to be able to lift/ lower >/=15#  to and from the floor and over head, to promote being able to carry children at home with no report of pain.     Baseline  pain at 6-7/10 with carrying objects such as baby carrier and children    Time  6    Period  Weeks    Status  New    Target Date  05/06/19      PT LONG TERM GOAL #3   Title  pt to be I with all HEP given as of last visit to maintain and progress current level of function    Baseline  no previous HEP    Time  6    Period  Weeks    Status  New    Target Date  05/06/19             Plan - 03/11/19 1046    Clinical Impression Statement  pt presents to OPPT with referral for back pain. pt has functional lumbar/ thoracic ROM as well as bil shoulder. pt rpeorts pain location along the medial border of the L scapulae. TTP along the Rhomboids and L upper trap with mulitple trigger points noted. MMT revealed weakness int he L shoulder in all planes compared bil. She would benefit from physical therapy to decrease L scapular pain, increase strength, promote posture and maximize function by addressing the defictis listed.    Stability/Clinical Decision Making  Stable/Uncomplicated    Clinical Decision Making  Low    Rehab Potential  Good    PT Frequency  2x / week    PT Duration  6 weeks   initial authorization 1 x a week for the next 3 weeks.   PT Treatment/Interventions  ADLs/Self Care Home Management;Cryotherapy;Electrical Stimulation;Iontophoresis 4mg /ml Dexamethasone;Moist Heat;Ultrasound;Therapeutic activities;Therapeutic exercise;Patient/family education;Manual techniques;Passive range of motion;Dry needling;Taping    PT Next Visit Plan  review/ update HEP, STW along Rhomboids, and stretching rhomboids/ upper trap,  shoulder strengthening, posture education    PT Home Exercise Plan  Rows, upper trap stretch, shoulder IR/ER with theraband    Consulted and Agree with Plan of Care  Patient       Patient will benefit from skilled therapeutic intervention in order to improve the following deficits and impairments:  Pain, Improper body mechanics, Postural dysfunction, Decreased  strength, Impaired UE functional use, Decreased activity tolerance  Visit Diagnosis: 1. Dorsalgia   2. Chronic left shoulder pain   3. Muscle weakness (generalized)   4. Abnormal posture        Problem List Patient Active Problem List   Diagnosis Date Noted  . Back pain 02/26/2019  . GERD (gastroesophageal reflux disease) 02/09/2019  . Spider bite 12/24/2018  . Normal postpartum course 11/02/2018  . History of depression 11/02/2018  . Preterm delivery, delivered 10/31/2018  . Preterm labor 10/30/2018  . Hx of preeclampsia, prior pregnancy, currently pregnant 10/30/2018  . Gestational diabetes mellitus (GDM) controlled on oral hypoglycemic drug 10/30/2018  . Melasma 09/24/2018  . Abnormal glucose tolerance test (GTT) during pregnancy, antepartum  09/08/2018  . Positive pregnancy test 04/03/2018  . History of food allergy 03/31/2018  . Rash 03/31/2018  . 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 Itching 02/19/2018  . Weight gain 02/06/2018  . Allergic rhinitis 01/23/2018   Starr Lake PT, DPT, LAT, ATC  03/11/19  10:59 AM       [facility] [address] [place], [place], [address] Phone: [phone]   Fax:  [phone]  Name: Cindy Howell MRN: [id number] Date of Birth: [birth year]

## 2019-03-15 ENCOUNTER — Telehealth (INDEPENDENT_AMBULATORY_CARE_PROVIDER_SITE_OTHER): Payer: Medicaid Other | Admitting: Family Medicine

## 2019-03-15 ENCOUNTER — Other Ambulatory Visit: Payer: Self-pay | Admitting: Family Medicine

## 2019-03-15 DIAGNOSIS — R0602 Shortness of breath: Secondary | ICD-10-CM

## 2019-03-15 DIAGNOSIS — N6320 Unspecified lump in the left breast, unspecified quadrant: Secondary | ICD-10-CM

## 2019-03-15 DIAGNOSIS — J302 Other seasonal allergic rhinitis: Secondary | ICD-10-CM

## 2019-03-15 MED ORDER — FLUTICASONE PROPIONATE 50 MCG/ACT NA SUSP: 1.0000 | Freq: Every day | NASAL | 2 refills | Status: DC

## 2019-03-15 MED ORDER — CETIRIZINE HCL 10 MG PO TABS: 10.0000 mg | ORAL_TABLET | Freq: Every day | ORAL | 11 refills | Status: DC

## 2019-03-15 NOTE — Progress Notes (Signed)
[facility] Telemedicine Visit  Patient consented to have virtual visit. Method of visit: Telephone  Encounter participants: Patient: Cindy Howell - located at home Provider: Ralene Ok - located at [facility] Others (if applicable): none  Chief Complaint: SOB  HPI:  She is feeling SOB x 2-3 days. She isn;t sure why, but does have seasonal allergies. She normally uses flonase and zyrtec. She also has heartburn. She is not working, she is home with her two kids. Lives with her husband, he is a Ship broker and works from home. They have had minimal exposure to the community, just her husband goes to the grocery store every once and a while. Notices SOB just occasionally, more w going up the stairs. She also notes that w her previous episodes of heartburn, she has felt similarly. The omeprazole is helping a good bit. She is not currently taking allergy medicine. She needs more nasal spray. She has a home pulse ox that has been normal during SOB.   She is still having a breast lump in her L breast. No family history of breast cancer.   ROS: per HPI  Pertinent PMHx: seasonal allergies  Exam:  Respiratory: speaking easily in long sentences.  Breast lump she reports is still under the L nipple  Assessment/Plan:  SOB: Patient reports this is similar to previous seasonal allergy flares, will treat as such with Zyrtec and Flonase.  I have a low suspicion for coronavirus in this patient and she does not work and does not have any known exposures.  She should call if this does not improve within a week or anytime her shortness of breath worsens.  Left breast mass: She is no longer breast-feeding, and previous exam on 4/29 was consistent with a possible galactocele.  However, given that the lump is still present we should go ahead with imaging.  Ultrasound was ordered and scheduled with the patient.  Time spent during visit with patient: 10 minutes  Ralene Ok, MD

## 2019-03-16 ENCOUNTER — Other Ambulatory Visit: Payer: Medicaid Other

## 2019-03-19 ENCOUNTER — Encounter

## 2019-03-19 DIAGNOSIS — Z5181 Encounter for therapeutic drug level monitoring: Secondary | ICD-10-CM | POA: Diagnosis not present

## 2019-03-22 ENCOUNTER — Ambulatory Visit: Payer: Medicaid Other | Attending: Family Medicine | Admitting: Physical Therapy

## 2019-03-22 ENCOUNTER — Other Ambulatory Visit: Payer: Self-pay

## 2019-03-22 DIAGNOSIS — Z5181 Encounter for therapeutic drug level monitoring: Secondary | ICD-10-CM | POA: Diagnosis not present

## 2019-03-22 DIAGNOSIS — M6281 Muscle weakness (generalized): Secondary | ICD-10-CM

## 2019-03-22 DIAGNOSIS — M549 Dorsalgia, unspecified: Secondary | ICD-10-CM

## 2019-03-22 DIAGNOSIS — R293 Abnormal posture: Secondary | ICD-10-CM | POA: Insufficient documentation

## 2019-03-22 DIAGNOSIS — G8929 Other chronic pain: Secondary | ICD-10-CM | POA: Insufficient documentation

## 2019-03-22 DIAGNOSIS — M25512 Pain in left shoulder: Secondary | ICD-10-CM | POA: Diagnosis not present

## 2019-03-22 NOTE — Patient Instructions (Signed)

## 2019-03-22 NOTE — Therapy (Signed)
[place], [place], [address] Phone: [phone]   Fax:  [phone]  Physical Therapy Treatment  Patient Details  Name: Cindy Howell MRN: [id number] Date of Birth: 1986/03/24 Referring Provider (PT): Kinnie Feil, MD    Encounter Date: 03/22/2019  PT End of Session - 03/22/19 1109    Visit Number  2    Number of Visits  13    Date for PT Re-Evaluation  05/06/19    Authorization Type  MCD: Re-submit at 4th visit    PT Start Time  0930    PT Stop Time  1019    PT Time Calculation (min)  49 min    Activity Tolerance  Patient tolerated treatment well       Past Medical History:  Diagnosis Date  . Allergy    goat meat, pollen  . Gestational diabetes   . Urticaria     No past surgical history on file.  There were no vitals filed for this visit.  Subjective Assessment - 03/22/19 1108    Subjective  Patient reports that her pain is a little better. She has been working on her exercises at home. The pain is mostly around her lower trap area today.    Currently in Pain?  Yes    Pain Score  4     Pain Location  Shoulder    Pain Orientation  Left    Pain Descriptors / Indicators  Aching    Pain Type  Chronic pain    Pain Onset  More than a month ago    Pain Frequency  Intermittent    Aggravating Factors   holding baby    Pain Relieving Factors  massage    Effect of Pain on Daily Activities  pain when holding her baby                       [facility] Adult PT Treatment/Exercise - 03/22/19 0001      Shoulder Exercises: Standing   External Rotation  Strengthening;10 reps;Theraband    Theraband Level (Shoulder External Rotation)  Level 3 (Green)    Internal Rotation  Strengthening;Left;10 reps;Theraband   2x10   Theraband Level (Shoulder Internal Rotation)  Level 3 (Green)    Extension  10 reps;Strengthening;Both   2x10   Theraband Level (Shoulder Extension)  Level 3 (Green)    Row  Strengthening;10 reps;Both;Theraband   2x10   Theraband Level (Shoulder Row)  Level 3 (Green)      Shoulder Exercises: Stretch   Other Shoulder Stretches  posteriro capsule strength 3x20 sec mod cuing; levator stretch 3x20 sec hold; rhomboid sink stretch 3x20 sec hold;     Other Shoulder Stretches  upper trap 2 x 30      Manual Therapy   Manual Therapy  Soft tissue mobilization;Myofascial release    Soft tissue mobilization  trigger point release using IASTYM and ,manual to medical scapular border, levator, and upper trap                PT Short Term Goals - 03/22/19 1119      PT SHORT TERM GOAL #1   Title  pt to be I with inital HEP    Time  3    Period  Weeks    Status  On-going      PT SHORT TERM GOAL #2   Title  pt to verbalize/ demo proper posture and liftingmechanics to reduce and  prevent shoulder pain    Baseline  no previous knowledge of posture    Time  3    Period  Weeks    Target Date  04/01/19        PT Long Term Goals - 03/11/19 1052      PT LONG TERM GOAL #1   Title  pt to increase L shoulder strength to >/= 4+/5 in all planes to promote shoulder stability and biomechanics    Baseline  L shoulder grossly 4/5    Time  6    Period  Weeks    Status  New    Target Date  05/06/19      PT LONG TERM GOAL #2   Title  pt to be able to lift/ lower >/=15#  to and from the floor and over head, to promote being able to carry children at home with no report of pain.    Baseline  pain at 6-7/10 with carrying objects such as baby carrier and children    Time  6    Period  Weeks    Status  New    Target Date  05/06/19      PT LONG TERM GOAL #3   Title  pt to be I with all HEP given as of last visit to maintain and progress current level of function    Baseline  no previous HEP    Time  6    Period  Weeks    Status  New    Target Date  05/06/19            Plan - 03/22/19 1110    Clinical Impression Statement  Patient tolerated treatment well.  She had some spasming in her pari-scapular area and into her upper trap/levator ares. Therapy spent time educating patient on trigger point dry needling. She seesm like she is interested but she was ggiven some information to review at home. Therapy instead perfroemd trigger point release using IASTYM and manual triugger point release. She was given a rhomboid and levator stretch for home. She was also given another exercis for posterior chanin strengthening. The only exercise that she had a hard time with was shoulder ER. Therapy will contiue to progress as tolerate.d    Stability/Clinical Decision Making  Stable/Uncomplicated    Rehab Potential  Good    PT Frequency  2x / week    PT Duration  6 weeks    PT Treatment/Interventions  ADLs/Self Care Home Management;Cryotherapy;Electrical Stimulation;Iontophoresis 4mg /ml Dexamethasone;Moist Heat;Ultrasound;Therapeutic activities;Therapeutic exercise;Patient/family education;Manual techniques;Passive range of motion;Dry needling;Taping    PT Next Visit Plan  review/ update HEP, STW along Rhomboids, and stretching rhomboids/ upper trap,  shoulder strengthening, posture education, consoder TPDN    PT Home Exercise Plan  Rows, upper trap stretch, shoulder IR/ER with theraband    Consulted and Agree with Plan of Care  Patient       Patient will benefit from skilled therapeutic intervention in order to improve the following deficits and impairments:  Pain, Improper body mechanics, Postural dysfunction, Decreased strength, Impaired UE functional use, Decreased activity tolerance  Visit Diagnosis: 1. Dorsalgia   2. Chronic left shoulder pain   3. Muscle weakness (generalized)   4. Abnormal posture        Problem List Patient Active Problem List   Diagnosis Date Noted  . Back pain 02/26/2019  . GERD (gastroesophageal reflux disease) 02/09/2019  . Spider bite 12/24/2018  . History of depression 11/02/2018  . Hx of  preeclampsia, prior pregnancy,  currently pregnant 10/30/2018  . Gestational diabetes mellitus (GDM) controlled on oral hypoglycemic drug 10/30/2018  . Melasma 09/24/2018  . Abnormal glucose tolerance test (GTT) during pregnancy, antepartum 09/08/2018  . History of food allergy 03/31/2018  . Rash 03/31/2018  . 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 Itching 02/19/2018  . Weight gain 02/06/2018  . Allergic rhinitis 01/23/2018    Carney Living PT DPT  03/22/2019, 11:27 AM  [facility] [address] [place], [place], [address] Phone: [phone]   Fax:  [phone]  Name: Jiovanna Frei MRN: [id number] Date of Birth: 05-24-1986

## 2019-03-23 ENCOUNTER — Other Ambulatory Visit: Payer: Self-pay | Admitting: Family Medicine

## 2019-03-23 ENCOUNTER — Ambulatory Visit
Admission: RE | Admit: 2019-03-23 | Discharge: 2019-03-23 | Disposition: A | Discharge status: Home / Self Care | Payer: Medicaid Other | Source: Ambulatory Visit | Attending: Family Medicine | Admitting: Family Medicine

## 2019-03-23 ENCOUNTER — Telehealth (INDEPENDENT_AMBULATORY_CARE_PROVIDER_SITE_OTHER): Payer: Medicaid Other | Admitting: Family Medicine

## 2019-03-23 DIAGNOSIS — R928 Other abnormal and inconclusive findings on diagnostic imaging of breast: Secondary | ICD-10-CM | POA: Diagnosis not present

## 2019-03-23 DIAGNOSIS — N6324 Unspecified lump in the left breast, lower inner quadrant: Secondary | ICD-10-CM | POA: Diagnosis not present

## 2019-03-23 DIAGNOSIS — N6320 Unspecified lump in the left breast, unspecified quadrant: Secondary | ICD-10-CM

## 2019-03-23 DIAGNOSIS — N6323 Unspecified lump in the left breast, lower outer quadrant: Secondary | ICD-10-CM | POA: Diagnosis not present

## 2019-03-23 DIAGNOSIS — K0510 Chronic gingivitis, plaque induced: Secondary | ICD-10-CM | POA: Insufficient documentation

## 2019-03-23 DIAGNOSIS — K148 Other diseases of tongue: Secondary | ICD-10-CM | POA: Diagnosis not present

## 2019-03-23 MED ORDER — CHLORHEXIDINE GLUCONATE 0.12 % MT SOLN: 15.0000 mL | Freq: Two times a day (BID) | OROMUCOSAL | 0 refills | Status: DC

## 2019-03-23 NOTE — Assessment & Plan Note (Signed)
She was informed that there is no way that we could provide additional insurance coverage for.  She needed to move forward with a deep cleaning, she would have to find alternative way to finance it. -Chlorhexidine mouthwash refilled

## 2019-03-23 NOTE — Assessment & Plan Note (Signed)
Differential includes aphthous ulcer, cold sore, healing trauma.  She is encouraged to make an appointment for the coming week.  If the lesion improved before the appointment she could cancel.  It Is difficult to provide further assessment for this tongue lesion over the phone.

## 2019-03-23 NOTE — Progress Notes (Signed)
[facility] Telemedicine Visit  Patient consented to have virtual visit. Method of visit: Telephone  Encounter participants: Patient: Cindy Howell - located at home Provider: Matilde Haymaker - located at [facility] Others (if applicable): none  Chief Complaint: Insurance coverage  HPI: Gingivitis/gum pain/discomfort She reports that she has been experiencing pain in her gums on and off for the past 2 months.  She first called and spoke with the resident about 2 months ago.  At that time, she was diagnosed with gingivitis and referred to a dentist.  She was seen at a Medicaid family dentist who advised that she needed a deep cleaning which would not be covered by her Medicaid insurance.  She is now calling the family medicine clinic to see if there is any way that we could help get coverage for this procedure.  She was under the impression that most of specialist care should be covered by Medicaid if she was first referred by her primary care physician.  Tongue lesion She reports that she has had a new tongue lesion for roughly the past week.  She reports it is a small white spot on the side of her tongue.  Is been stable in size and severity symptoms appearing.  It is not bleeding.  She feels some improvement in the pain when she uses her chlorhexidine mouthwash.   ROS: per HPI  Pertinent PMHx: Noncontributory  Exam:  Respiratory: Speaking in long sentences without effort.  No evidence of shortness of breath or coughing.  Assessment/Plan:  Gingivitis She was informed that there is no way that we could provide additional insurance coverage for.  She needed to move forward with a deep cleaning, she would have to find alternative way to finance it. -Chlorhexidine mouthwash refilled  Lesion of tongue Differential includes aphthous ulcer, cold sore, healing trauma.  She is encouraged to make an appointment for the coming week.  If the lesion improved before the appointment  she could cancel.  It Is difficult to provide further assessment for this tongue lesion over the phone.    Time spent during visit with patient: 15 minutes

## 2019-03-23 NOTE — Progress Notes (Signed)
WT- 182LB P- N/A T- Normal BP- [place] - [address]  Pt gave consent to Telephone visit Salvatore Marvel, CMA

## 2019-03-29 ENCOUNTER — Telehealth: Payer: Self-pay | Admitting: Family Medicine

## 2019-03-29 DIAGNOSIS — Z5181 Encounter for therapeutic drug level monitoring: Secondary | ICD-10-CM | POA: Diagnosis not present

## 2019-03-29 NOTE — Telephone Encounter (Signed)
Pt would like to have a referral to an Optometrist.

## 2019-04-01 ENCOUNTER — Ambulatory Visit: Payer: Medicaid Other | Admitting: Physical Therapy

## 2019-04-05 DIAGNOSIS — Z5181 Encounter for therapeutic drug level monitoring: Secondary | ICD-10-CM | POA: Diagnosis not present

## 2019-04-09 ENCOUNTER — Other Ambulatory Visit: Payer: Self-pay

## 2019-04-09 ENCOUNTER — Telehealth (INDEPENDENT_AMBULATORY_CARE_PROVIDER_SITE_OTHER): Payer: Medicaid Other | Admitting: Family Medicine

## 2019-04-09 ENCOUNTER — Encounter: Payer: Self-pay | Admitting: Family Medicine

## 2019-04-09 VITALS — Wt 184.0 lb

## 2019-04-09 DIAGNOSIS — K219 Gastro-esophageal reflux disease without esophagitis: Secondary | ICD-10-CM

## 2019-04-09 DIAGNOSIS — K0889 Other specified disorders of teeth and supporting structures: Secondary | ICD-10-CM | POA: Diagnosis not present

## 2019-04-09 DIAGNOSIS — K0510 Chronic gingivitis, plaque induced: Secondary | ICD-10-CM

## 2019-04-09 MED ORDER — CHLORHEXIDINE GLUCONATE 0.12 % MT SOLN: 15.0000 mL | Freq: Two times a day (BID) | OROMUCOSAL | 0 refills | Status: DC

## 2019-04-09 MED ORDER — IBUPROFEN 600 MG PO TABS: 600.0000 mg | ORAL_TABLET | Freq: Three times a day (TID) | ORAL | 0 refills | Status: DC | PRN

## 2019-04-09 MED ORDER — OMEPRAZOLE 40 MG PO CPDR: 40.0000 mg | DELAYED_RELEASE_CAPSULE | Freq: Every day | ORAL | 0 refills | Status: DC

## 2019-04-09 NOTE — Assessment & Plan Note (Signed)
Refilled chlorhexidine mouthwash.

## 2019-04-09 NOTE — Assessment & Plan Note (Signed)
Given that patient does not have ibuprofen at home, will prescribe ibuprofen 600mg  q8h prn.  Advised that if pain is severe, could also alternate tylenol 650mg  q8hr with ibuprofen.  Explained to patient how to alternate these and she repeated by alternating schedule.  F/u with dentist as scheduled.

## 2019-04-09 NOTE — Assessment & Plan Note (Signed)
Doing well with omeprazole.  Refilled omeprazole.

## 2019-04-09 NOTE — Progress Notes (Signed)
[facility] Telemedicine Visit  Patient consented to have virtual visit. Method of visit: Video  Encounter participants: Patient: Cindy Howell - located at home Provider: Cleophas Dunker - located at [facility] Others (if applicable): None  Chief Complaint: Toothpain  HPI: Patient notes that she follows with [facility] in [facility] and was recently seen for extraction of two teeth on the left on 7/22.  She was advised to use ibuprofen 800mg  for the pain by the dentist, but patient does not have this at home and would like a prescription for this.  She denies fevers and notes that it appears to be healing well.  Patient has gingivitis and has been using chlorhexidine solution for this with good relief.  She is requesting a refill of this.  Patient also reports a history of GERD and would like a refill of her omeprazole.  She notes that she currently has a few pills left, but given that she will need to take ibuprofen for her pain, she is requesting another prescription.   ROS: per HPI  Pertinent PMHx: GERD, she is not currently breast feeding  Exam:  Respiratory: speaking in complete sentences over the phone without evidence of respiratory distress  Assessment/Plan:  Toothache Given that patient does not have ibuprofen at home, will prescribe ibuprofen 600mg  q8h prn.  Advised that if pain is severe, could also alternate tylenol 650mg  q8hr with ibuprofen.  Explained to patient how to alternate these and she repeated by alternating schedule.  F/u with dentist as scheduled.  Gingivitis Refilled chlorhexidine mouthwash.  GERD (gastroesophageal reflux disease) Doing well with omeprazole.  Refilled omeprazole.    Time spent during visit with patient: 11 minutes

## 2019-04-10 ENCOUNTER — Ambulatory Visit (HOSPITAL_COMMUNITY)
Admission: EM | Admit: 2019-04-10 | Discharge: 2019-04-10 | Disposition: A | Discharge status: Home / Self Care | Payer: Medicaid Other | Attending: Emergency Medicine | Admitting: Emergency Medicine

## 2019-04-10 ENCOUNTER — Encounter (HOSPITAL_COMMUNITY): Payer: Self-pay | Admitting: *Deleted

## 2019-04-10 ENCOUNTER — Other Ambulatory Visit: Payer: Self-pay

## 2019-04-10 DIAGNOSIS — K0889 Other specified disorders of teeth and supporting structures: Secondary | ICD-10-CM

## 2019-04-10 MED ORDER — HYDROCODONE-ACETAMINOPHEN 5-325 MG PO TABS: 1.0000 | ORAL_TABLET | Freq: Four times a day (QID) | ORAL | 0 refills | Status: DC | PRN

## 2019-04-10 MED ORDER — AMOXICILLIN-POT CLAVULANATE 875-125 MG PO TABS: 1.0000 | ORAL_TABLET | Freq: Two times a day (BID) | ORAL | 0 refills | Status: AC

## 2019-04-10 NOTE — ED Triage Notes (Signed)
States went to dentist for filling in left wisdom teeth approx 2 wks ago; started with pain to area shortly thereafter.  Went to another dentist 3 days ago - had tooth extraction.  Continues with pain to left jaw radiating up into left ear.  Taking IBU regularly.

## 2019-04-10 NOTE — Discharge Instructions (Signed)
Begin Augmentin twice a day for the next week to cover for infection  Use anti-inflammatories for pain/swelling. You may take up to 800 mg Ibuprofen every 8 hours with food. You may supplement Ibuprofen with Tylenol [phone] mg every 8 hours.   May use hydrocodone for severe pain.  Use sparingly, do not drive or work after taking, will cause drowsiness.  Please follow-up with dentist if having persistent pain  Follow-up here or emergency room if developing fevers, swelling, neck stiffness, difficulty swallowing

## 2019-04-11 NOTE — ED Provider Notes (Signed)
[facility]    CSN: [id number] Arrival date & time: 04/10/19  1209      History   Chief Complaint Chief Complaint  Patient presents with  . Appointment    1210  . Dental Pain    HPI Cindy Howell is a 33 y.o. female no contributing past medical history presenting today for evaluation of dental pain.  Patient states that she has had issues with her tooth for the past week.  She was seen by dentistry approximately 2 weeks ago.  She had a filling placed.  Shortly after she developed pain to her left lower molar.  She went to a different dentist and had her tooth extracted.  She continues to have pain in her left lower jaw.  She is concerned as she is not on antibiotics.  She has been taking ibuprofen 600 mg regularly.  She denies any fevers.  Denies difficulty swallowing.  Denies neck stiffness.  Her pain has caused left ear pain and headaches on the left side of her head/neck.   HPI  Past Medical History:  Diagnosis Date  . Allergy    goat meat, pollen  . Gestational diabetes   . Urticaria     Patient Active Problem List   Diagnosis Date Noted  . Toothache 04/09/2019  . Gingivitis 03/23/2019  . Lesion of tongue 03/23/2019  . Back pain 02/26/2019  . GERD (gastroesophageal reflux disease) 02/09/2019  . Spider bite 12/24/2018  . History of depression 11/02/2018  . Hx of preeclampsia, prior pregnancy, currently pregnant 10/30/2018  . Gestational diabetes mellitus (GDM) controlled on oral hypoglycemic drug 10/30/2018  . Melasma 09/24/2018  . Abnormal glucose tolerance test (GTT) during pregnancy, antepartum 09/08/2018  . History of food allergy 03/31/2018  . Rash 03/31/2018  . 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 Itching 02/19/2018  . Weight gain 02/06/2018  . Allergic rhinitis 01/23/2018    History reviewed. No pertinent surgical history.  OB History    Gravida  3   Para  3   Term  3   Preterm      AB      Living  3     SAB      TAB      Ectopic       Multiple      Live Births  3            Home Medications    Prior to Admission medications   Medication Sig Start Date End Date Taking? Authorizing Provider  chlorhexidine (PERIDEX) 0.12 % solution Use as directed 15 mLs in the mouth or throat 2 (two) times daily. 04/09/19  Yes Meccariello, Bernita Raisin, DO  ibuprofen (ADVIL) 600 MG tablet Take 1 tablet (600 mg total) by mouth every 8 (eight) hours as needed for moderate pain. 04/09/19  Yes Meccariello, Bernita Raisin, DO  omeprazole (PRILOSEC) 40 MG capsule Take 1 capsule (40 mg total) by mouth daily. 04/09/19  Yes Meccariello, Bernita Raisin, DO  amoxicillin-clavulanate (AUGMENTIN) 875-125 MG tablet Take 1 tablet by mouth every 12 (twelve) hours for 7 days. 04/10/19 04/17/19  ,  C, PA-C  cetirizine (ZYRTEC) 10 MG tablet Take 1 tablet (10 mg total) by mouth daily. 03/15/19   Sela Hilding, MD  EPINEPHrine (EPIPEN 2-PAK) 0.3 mg/0.3 mL IJ SOAJ injection Use as directed for severe allergic reactions Patient not taking: Reported on 03/11/2019 04/06/18   Bobbitt, Sedalia Muta, MD  fluticasone [facility]) 50 MCG/ACT nasal spray Place 1 spray into both nostrils  daily. 1 spray in each nostril every day 03/15/19   Sela Hilding, MD  HYDROcodone-acetaminophen (NORCO/VICODIN) 5-325 MG tablet Take 1 tablet by mouth every 6 (six) hours as needed for severe pain. 04/10/19   ,  C, PA-C  pantoprazole (PROTONIX) 40 MG tablet Take 40 mg by mouth daily.    [provider]  Prenatal Multivit-Min-Fe-FA (PRENATAL VITAMINS) 0.8 MG tablet Take 1 tablet by mouth daily. 04/14/18   Lovenia Kim, MD    Family History Family History  Problem Relation Age of Onset  . Diabetes Mother   . Heart disease Mother   . Diabetes Father   . Heart disease Father   . Kidney disease Father   . Kidney disease Brother     Social History Social History   Tobacco Use  . Smoking status: Never Smoker  . Smokeless tobacco: Never Used  Substance  Use Topics  . Alcohol use: Never    Frequency: Never  . Drug use: Never     Allergies   Beef-derived products, Goat-derived products, and Shrimp [shellfish allergy]   Review of Systems Review of Systems  Constitutional: Negative for activity change, appetite change, chills, fatigue and fever.  HENT: Positive for dental problem. Negative for congestion, ear pain, rhinorrhea, sinus pressure, sore throat and trouble swallowing.   Eyes: Negative for discharge and redness.  Respiratory: Negative for cough, chest tightness and shortness of breath.   Cardiovascular: Negative for chest pain.  Gastrointestinal: Negative for abdominal pain, diarrhea, nausea and vomiting.  Musculoskeletal: Negative for myalgias.  Skin: Negative for rash.  Neurological: Negative for dizziness, light-headedness and headaches.     Physical Exam Triage Vital Signs ED Triage Vitals  Enc Vitals Group     BP 04/10/19 1226 134/84     Pulse Rate 04/10/19 1226 83     Resp 04/10/19 1226 16     Temp --      Temp src --      SpO2 04/10/19 1226 97 %     Weight --      Height --      Head Circumference --      Peak Flow --      Pain Score 04/10/19 1225 5     Pain Loc --      Pain Edu? --      Excl. in [place]? --    No data found.  Updated Vital Signs BP 134/84   Pulse 83   Resp 16   SpO2 97%   Breastfeeding No   Visual Acuity Right Eye Distance:   Left Eye Distance:   Bilateral Distance:    Right Eye Near:   Left Eye Near:    Bilateral Near:     Physical Exam Vitals signs and nursing note reviewed.  Constitutional:      General: She is not in acute distress.    Appearance: She is well-developed.  HENT:     Head: Normocephalic and atraumatic.     Ears:     Comments: Bilateral ears without tenderness to palpation of external auricle, tragus and mastoid, EAC's without erythema or swelling, TM's with good bony landmarks and cone of light. Non erythematous.     Nose:     Comments: Nasal mucosa  pink, nonswollen turbinates    Mouth/Throat:     Comments: Oral mucosa pink and moist, no tonsillar enlargement or exudate. Posterior pharynx patent and nonerythematous, no uvula deviation or swelling. Normal phonation.  Posterior molar on left lower jaw  missing, mild associated gingival erythema, tenderness to palpation, no obvious pus noted from extraction hole.  No soft palate swelling, nontender to palpation below the tongue Eyes:     Conjunctiva/sclera: Conjunctivae normal.     Comments: Wearing glasses  Neck:     Musculoskeletal: Neck supple.     Comments: Full active range of motion of neck, no overlying erythema swelling or erythema, no palpable lymphadenopathy Cardiovascular:     Rate and Rhythm: Normal rate and regular rhythm.     Heart sounds: No murmur.  Pulmonary:     Effort: Pulmonary effort is normal. No respiratory distress.     Breath sounds: Normal breath sounds.  Abdominal:     Palpations: Abdomen is soft.     Tenderness: There is no abdominal tenderness.  Skin:    General: Skin is warm and dry.  Neurological:     Mental Status: She is alert.      UC Treatments / Results  Labs (all labs ordered are listed, but only abnormal results are displayed) Labs Reviewed - No data to display  EKG   Radiology No results found.  Procedures Procedures (including critical care time)  Medications Ordered in UC Medications - No data to display  Initial Impression / Assessment and Plan / UC Course  I have reviewed the triage vital signs and the nursing notes.  Pertinent labs & imaging results that were available during my care of the patient were reviewed by me and considered in my medical decision making (see chart for details).     Will place patient on Augmentin to cover underlying infectious cause.  Continue anti-inflammatories.  Provided hydrocodone to use for severe pain.  Discussed drowsiness regarding this.  Advised to follow-up with dentistry if  symptoms persisting.  Monitor for gradual resolution around extraction area.Discussed strict return precautions. Patient verbalized understanding and is agreeable with plan.  Final Clinical Impressions(s) / UC Diagnoses   Final diagnoses:  Pain, dental     Discharge Instructions     Begin Augmentin twice a day for the next week to cover for infection  Use anti-inflammatories for pain/swelling. You may take up to 800 mg Ibuprofen every 8 hours with food. You may supplement Ibuprofen with Tylenol [phone] mg every 8 hours.   May use hydrocodone for severe pain.  Use sparingly, do not drive or work after taking, will cause drowsiness.  Please follow-up with dentist if having persistent pain  Follow-up here or emergency room if developing fevers, swelling, neck stiffness, difficulty swallowing   ED Prescriptions    Medication Sig Dispense Auth. Provider   amoxicillin-clavulanate (AUGMENTIN) 875-125 MG tablet Take 1 tablet by mouth every 12 (twelve) hours for 7 days. 14 tablet ,  C, PA-C   HYDROcodone-acetaminophen (NORCO/VICODIN) 5-325 MG tablet Take 1 tablet by mouth every 6 (six) hours as needed for severe pain. 6 tablet , Rochelle C, PA-C     Controlled Substance Prescriptions [place] Controlled Substance Registry consulted? Yes, I have consulted the [place] Controlled Substances Registry for this patient, and feel the risk/benefit ratio today is favorable for proceeding with this prescription for a controlled substance.   Janith Lima, [place] 04/11/19 [phone]

## 2019-04-12 ENCOUNTER — Ambulatory Visit: Payer: Medicaid Other | Admitting: Physical Therapy

## 2019-04-14 DIAGNOSIS — Z5181 Encounter for therapeutic drug level monitoring: Secondary | ICD-10-CM | POA: Diagnosis not present

## 2019-04-16 ENCOUNTER — Other Ambulatory Visit: Payer: Self-pay

## 2019-04-16 ENCOUNTER — Ambulatory Visit (INDEPENDENT_AMBULATORY_CARE_PROVIDER_SITE_OTHER): Payer: Medicaid Other | Admitting: Family Medicine

## 2019-04-16 VITALS — BP 110/68 | HR 76 | Wt 191.0 lb

## 2019-04-16 DIAGNOSIS — R399 Unspecified symptoms and signs involving the genitourinary system: Secondary | ICD-10-CM

## 2019-04-16 DIAGNOSIS — H9202 Otalgia, left ear: Secondary | ICD-10-CM | POA: Diagnosis not present

## 2019-04-16 DIAGNOSIS — K0889 Other specified disorders of teeth and supporting structures: Secondary | ICD-10-CM

## 2019-04-16 DIAGNOSIS — K219 Gastro-esophageal reflux disease without esophagitis: Secondary | ICD-10-CM

## 2019-04-16 MED ORDER — OMEPRAZOLE 40 MG PO CPDR: 40.0000 mg | DELAYED_RELEASE_CAPSULE | Freq: Every day | ORAL | 0 refills | Status: DC

## 2019-04-16 MED ORDER — BENZOCAINE 20 % MT LIQD: 1.0000 [drp] | Freq: Three times a day (TID) | OROMUCOSAL | 0 refills | Status: DC

## 2019-04-16 NOTE — Assessment & Plan Note (Signed)
Physical exam showed normal left ear and right ear anatomy without any evidence of infection or cerumen impaction.  Left ear pain most likely secondary to left TMJ/jaw pain/radiating tooth pain. -Patient prescribed Orajel, will follow-up with dentist in 3 days.  We will continue antibiotic (amoxicillin daily status post tooth extraction) and ibuprofen as needed for pain.  -Patient given head massage exercises to use on her jaw and temporal muscles 3 times daily.  Patient also told to use warm and/or cold compresses on face/jaw to help relieve pain.

## 2019-04-16 NOTE — Patient Instructions (Addendum)
Thank you for coming in to see [place] today! Please see below to review our plan for today's visit:  1. Use Orajel on your painful teeth. Follow up with your dentist on Monday. Keep taking your antibiotic daily and ibuprofen as needed!  2. Do your "head massages" to your jaw 3 times daily. Use a warm or cold compress on our face/jaw to help relieve pain. 3. Your ears both look wonderful and do not show any sign of infection!  Please call the clinic at [phone] if your symptoms worsen or you have any concerns. It was our pleasure to serve you!     Dr. Milus Banister [facility]   Dental Pain Dental pain may be caused by many things, including:  Tooth decay (cavities or caries).  Infection.  The inner part of the tooth being filled with pus (abscess).  Injury. Sometimes the cause of pain is unknown. Your pain can vary. It may be mild or severe. You may have it all the time, or it may occur only when you are:  Chewing.  Exposed to hot or cold temperature.  Eating or drinking sugary foods or beverages, such as soda or candy. Follow these instructions at home: Medicines  Take over-the-counter and prescription medicines only as told by your doctor.  If you were prescribed an antibiotic medicine, take it as told by your doctor. Do not stop taking the medicine even if you start to feel better. Eating and drinking  Do not eat foods or drinks that cause you pain. These include: ? Very hot or very cold foods or drinks. ? Sweet or sugary foods or drinks. Managing pain and swelling   Gargle with a salt-water mixture 3-4 times a day. To make this, dissolve -1 tsp of salt in 1 cup of warm water.  If told, put ice on the painful area of your face: ? Put ice in a plastic bag. ? Place a towel between your skin and the bag. ? Leave the ice on for 20 minutes, 2-3 times a day. Brushing your teeth  Brush your teeth twice a day using a fluoride toothpaste.  Floss  your teeth once a day.  Use a toothpaste made for sensitive teeth as told by your doctor.  Use a soft toothbrush. General instructions  Do not apply heat to the outside of your face.  Watch your dental pain. Let your doctor know if there are any changes.  Keep all follow-up visits as told by your doctor. This is important. Contact a doctor if:  Your pain is not relieved by medicines.  You have new symptoms.  Your symptoms get worse. Get help right away if:  You cannot open your mouth.  You are having trouble breathing or swallowing.  You have a fever.  Your face, neck, or jaw is swollen. Summary  Dental pain may be caused by many things, including tooth decay, injury, or infection. In some cases, the cause is not known.  Your pain may be mild or severe. You may have pain all the time, or you may have it only when you eat or drink.  Take over-the-counter and prescription medicines only as told by your doctor.  Watch your dental pain for any changes. Let your doctor know if symptoms get worse. This information is not intended to replace advice given to you by your health care provider. Make sure you discuss any questions you have with your health care provider. Document Released: 02/19/2008 Document Revised: 12/29/2018  Document Reviewed: 09/15/2017 Elsevier Patient Education  [employer].

## 2019-04-16 NOTE — Progress Notes (Signed)
   Subjective:    Patient ID: Cindy Howell, female    DOB: [birth year], 33 y.o.   MRN: [id number]   CC: Left ear pain  HPI: Patient has a history of poor dentition to her left lower jaw.  She recently had 2 teeth pulled and states that since then she has been having some left-sided ear and head pain.  She is concerned that she may have an ear infection.  She has been taking ibuprofen 600 mg as needed for the head/jaw pain.  The ibuprofen helps to relieve the pain.  She also finds it warm compresses help as well.  Chewing makes the pain worse.  She denies other associated symptoms including fevers, myalgias, difficulty swallowing, weight loss, headaches, nausea, vomiting, and abdominal pain.   Smoking status reviewed: Non-smoker  Review of Systems: See HPI   Objective:  BP 110/68   Pulse 76   Wt 191 lb (86.6 kg)   SpO2 98%   Breastfeeding No   BMI 32.79 kg/m  Vitals and nursing note reviewed  General: well nourished, in no acute distress, very pleasant patient HEENT: Moist mucous membranes, dentition has stains and pits as well as plaque present but no obvious infection or sign of drainage is appreciated; bilateral TMJ with normal function and range of motion, tenderness to palpation is elicited while touching masseter and temporal muscles on left side of patient's daughter and head. Neck: No thyromegaly or lymphadenopathy Cardiac: RRR, clear S1 and S2, no murmurs cruciated Respiratory: CTA bilaterally, no increased work of breathing Abdomen: soft, nontender, nondistended, no masses or organomegaly. Bowel sounds present Extremities: no edema or cyanosis. Warm, well perfused. 2+ radial and PT pulses bilaterally  Assessment & Plan:   Left ear pain Physical exam showed normal left ear and right ear anatomy without any evidence of infection or cerumen impaction.  Left ear pain most likely secondary to left TMJ/jaw pain/radiating tooth pain. -Patient prescribed Orajel, will follow-up  with dentist in 3 days.  We will continue antibiotic (amoxicillin daily status post tooth extraction) and ibuprofen as needed for pain.  -Patient given head massage exercises to use on her jaw and temporal muscles 3 times daily.  Patient also told to use warm and/or cold compresses on face/jaw to help relieve pain.  Return if symptoms worsen or fail to improve.   Dr. Milus Banister [facility], PGY-2

## 2019-04-19 ENCOUNTER — Ambulatory Visit: Payer: Medicaid Other | Admitting: Physical Therapy

## 2019-04-19 DIAGNOSIS — Z5181 Encounter for therapeutic drug level monitoring: Secondary | ICD-10-CM | POA: Diagnosis not present

## 2019-04-26 ENCOUNTER — Other Ambulatory Visit: Payer: Self-pay

## 2019-04-26 ENCOUNTER — Encounter: Payer: Self-pay | Admitting: Physical Therapy

## 2019-04-26 ENCOUNTER — Ambulatory Visit: Payer: Medicaid Other | Attending: Family Medicine | Admitting: Physical Therapy

## 2019-04-26 DIAGNOSIS — M6281 Muscle weakness (generalized): Secondary | ICD-10-CM | POA: Diagnosis not present

## 2019-04-26 DIAGNOSIS — G8929 Other chronic pain: Secondary | ICD-10-CM

## 2019-04-26 DIAGNOSIS — M549 Dorsalgia, unspecified: Secondary | ICD-10-CM | POA: Diagnosis not present

## 2019-04-26 DIAGNOSIS — M25512 Pain in left shoulder: Secondary | ICD-10-CM | POA: Insufficient documentation

## 2019-04-26 DIAGNOSIS — R293 Abnormal posture: Secondary | ICD-10-CM

## 2019-04-26 NOTE — Therapy (Signed)
[place], [place], [address] Phone: [phone]   Fax:  [phone]  Physical Therapy Treatment / Re-evaluation  Patient Details  Name: Cindy Howell MRN: [id number] Date of Birth: [birth year] Referring Provider (PT): Kinnie Feil, MD    Encounter Date: 04/26/2019  PT End of Session - 04/26/19 1636    Visit Number  3    Number of Visits  13    Date for PT Re-Evaluation  05/24/19    Authorization Type  MCD: Re-submitted on 3th visit    Authorization - Visit Number  1    Authorization - Number of Visits  3    PT Start Time  1640   pt arrived a few minutes late but requested to finish phone call before coming back for treatment   PT Stop Time  1708    PT Time Calculation (min)  28 min    Activity Tolerance  Patient tolerated treatment well    Behavior During Therapy  [facility] for tasks assessed/performed       Past Medical History:  Diagnosis Date  . Allergy    goat meat, pollen  . Gestational diabetes   . Urticaria     History reviewed. No pertinent surgical history.  There were no vitals filed for this visit.  Subjective Assessment - 04/26/19 1636    Subjective  "I wasn't able to come in due to wisdom tooth pain. My shoulder and back is doing better except for lifting heavy weight"    Patient Stated Goals  to remove the pain, to hold child and childs car seat    Currently in Pain?  Yes    Pain Score  0-No pain    Pain Orientation  Left         [facility] PT Assessment - 04/26/19 0001      Assessment   Medical Diagnosis  Acute back pain, unspecified back location, unspecified back pain laterality     Referring Provider (PT)  Kinnie Feil, MD       Strength   Left Shoulder Flexion  4/5    Left Shoulder Extension  4+/5    Left Shoulder ABduction  4/5   reproduced concordant symptoms   Left Shoulder Internal Rotation  4+/5    Left Shoulder External Rotation  5/5                    [facility] Adult PT Treatment/Exercise - 04/26/19 0001      Shoulder Exercises: Standing   Row  Strengthening;10 reps;Theraband    Theraband Level (Shoulder Row)  Level 4 (Blue)    Other Standing Exercises  lower trap y's 2 x 10      Shoulder Exercises: Stretch   Other Shoulder Stretches  upper trap 2 x 30      Manual Therapy   Manual therapy comments  MTPR along the L upper trap, Rhomboids and levator scapulae    Soft tissue mobilization  IASTM along the L upper trap             PT Education - 04/26/19 1709    Education Details  reviewed previously provided HEP and updated for shoulder strengthening    Person(s) Educated  Patient    Methods  Explanation;Verbal cues;Handout    Comprehension  Verbalized understanding;Verbal cues required       PT Short Term Goals - 04/26/19 1642      PT SHORT TERM GOAL #1  Title  pt to be I with inital HEP    Time  3    Period  Weeks    Status  Achieved      PT SHORT TERM GOAL #2   Title  pt to verbalize/ demo proper posture and liftingmechanics to reduce and prevent shoulder pain    Time  3    Period  Weeks    Status  Achieved        PT Long Term Goals - 04/26/19 1642      PT LONG TERM GOAL #1   Title  pt to increase L shoulder strength to >/= 4+/5 in all planes to promote shoulder stability and biomechanics    Baseline  l shoulder flexion and abduction 4/5 (with reproduction of concordant pain    Time  6    Period  Weeks    Status  On-going      PT LONG TERM GOAL #2   Title  pt to be able to lift/ lower >/=15#  to and from the floor and over head, to promote being able to carry children at home with no report of pain.    Baseline  pain at 6-7/10 with carrying objects such as baby carrier and children, but notes it is improving    Time  6    Period  Weeks    Status  On-going      PT LONG TERM GOAL #3   Title  pt to be I with all HEP given as of last visit to maintain and progress current level  of function    Baseline  independent with current HEP and progressing as able.    Time  6    Period  Weeks    Status  On-going            Plan - 04/26/19 1711    Clinical Impression Statement  pt arrived about being seen over 1 month ago due to having dental pain which has since resolved. pt reports no pain today but does report pain occuring when she lifts heavy objects. She met all STG's today and is progressing toward her LTG's and is motivated to continue with treatment to reduce shoulder pain and spasm. continued STW to calm down tightness int he L upper trap and worked on Chart[employer] loss adjuster. She would benefit from continued physical therapy to progress strengthening, promote scapular stability and maximize her function by addressing the deficits listed and work toward independent exercise.    Rehab Potential  Good    PT Frequency  1x / week    PT Duration  4 weeks    PT Treatment/Interventions  ADLs/Self Care Home Management;Cryotherapy;Electrical Stimulation;Iontophoresis 35m/ml Dexamethasone;Moist Heat;Ultrasound;Therapeutic activities;Therapeutic exercise;Patient/family education;Manual techniques;Passive range of motion;Dry needling;Taping    PT Next Visit Plan  review/ update HEP, STW along Rhomboids, and stretching rhomboids/ upper trap,  shoulder strengthening, posture education, consoder TPDN    PT Home Exercise Plan  Rows, upper trap stretch, shoulder IR/ER with theraband, horizonal abduction, lower trap wall y's    Consulted and Agree with Plan of Care  Patient       Patient will benefit from skilled therapeutic intervention in order to improve the following deficits and impairments:  Pain, Improper body mechanics, Postural dysfunction, Decreased strength, Impaired UE functional use, Decreased activity tolerance  Visit Diagnosis: 1. Dorsalgia   2. Chronic left shoulder pain   3. Muscle weakness (generalized)   4. Abnormal posture  Problem List Patient  Active Problem List   Diagnosis Date Noted  . Left ear pain 04/16/2019  . Toothache 04/09/2019  . Gingivitis 03/23/2019  . Lesion of tongue 03/23/2019  . Back pain 02/26/2019  . GERD (gastroesophageal reflux disease) 02/09/2019  . Spider bite 12/24/2018  . History of depression 11/02/2018  . Hx of preeclampsia, prior pregnancy, currently pregnant 10/30/2018  . Gestational diabetes mellitus (GDM) controlled on oral hypoglycemic drug 10/30/2018  . Melasma 09/24/2018  . Abnormal glucose tolerance test (GTT) during pregnancy, antepartum 09/08/2018  . History of food allergy 03/31/2018  . Rash 03/31/2018  . 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 Itching 02/19/2018  . Weight gain 02/06/2018  . Allergic rhinitis 01/23/2018   Starr Lake PT, DPT, LAT, ATC  04/26/19  5:24 PM      [facility] [address] [place], [place], [address] Phone: [phone]   Fax:  [phone]  Name: Keaunna Skipper MRN: [id number] Date of Birth: August 09, 1986

## 2019-04-27 DIAGNOSIS — Z5181 Encounter for therapeutic drug level monitoring: Secondary | ICD-10-CM | POA: Diagnosis not present

## 2019-05-04 ENCOUNTER — Other Ambulatory Visit: Payer: Self-pay

## 2019-05-04 ENCOUNTER — Ambulatory Visit (INDEPENDENT_AMBULATORY_CARE_PROVIDER_SITE_OTHER): Payer: Medicaid Other | Admitting: Family Medicine

## 2019-05-04 DIAGNOSIS — K0889 Other specified disorders of teeth and supporting structures: Secondary | ICD-10-CM

## 2019-05-04 DIAGNOSIS — S0301XA Dislocation of jaw, right side, initial encounter: Secondary | ICD-10-CM

## 2019-05-04 DIAGNOSIS — S0300XA Dislocation of jaw, unspecified side, initial encounter: Secondary | ICD-10-CM

## 2019-05-04 MED ORDER — IBUPROFEN 600 MG PO TABS: 600.0000 mg | ORAL_TABLET | Freq: Three times a day (TID) | ORAL | 0 refills | Status: DC | PRN

## 2019-05-04 NOTE — Patient Instructions (Signed)

## 2019-05-04 NOTE — Progress Notes (Signed)
     Subjective: Chief Complaint  Patient presents with  . mouth and gum pain   HPI: Cindy Howell is a 33 y.o. presenting to clinic today to discuss the following:  Mouth and Jaw Pain Patient states she has had 1.5 months of mouth and jaw pain on the left side. It occurred after a dental procedure where her mouth was left open for a long period of time. She has been to several dentist since then the pain is getting worse and it is now constant. Ibuprofen has helped with the pain. No fever, chills, abdominal pain, nausea, vomiting, no difficulty eating or swallowing. She does have pain on chewing on her right side. The pain also radiates to her right ear.     ROS noted in HPI.   Past Medical, Surgical, Social, and Family History Reviewed & Updated per EMR.   Pertinent Historical Findings include:   Social History   Tobacco Use  Smoking Status Never Smoker  Smokeless Tobacco Never Used      Objective: BP 100/70   Pulse 98   Wt 189 lb 12.8 oz (86.1 kg)   SpO2 98%   BMI 32.58 kg/m  Vitals and nursing notes reviewed  Physical Exam Gen: Alert and Oriented x 3, NAD HEENT: Normocephalic, atraumatic, PERRLA, EOMI, no pharyngeal erythema, no lesions inside the mouth, no swelling or abscess visible inside the mouth Neck: trachea midline, no thyroidmegaly, no LAD CV: RRR, no murmurs, normal S1, S2 split Resp: CTAB, no wheezing, rales, or rhonchi, comfortable work of breathing Skin: warm, dry, intact, no rashes  Assessment/Plan:  TMJ (dislocation of temporomandibular joint), initial encounter Patient most likely has TMJ given she has been seen by several dentist and no orthodontic issue has been found. Her symptoms of right jaw pain radiating to the ear are consistent with TMJ. Given it has been ongoing for a month with no fever or other systemic symptoms do not suspect mastoiditis or other acute infection. - Ibuprofen 600mg  TID prn - At home exercises given - F/u as needed    PATIENT EDUCATION PROVIDED: See AVS    Diagnosis and plan along with any newly prescribed medication(s) were discussed in detail with this patient today. The patient verbalized understanding and agreed with the plan. Patient advised if symptoms worsen return to clinic or ER.    No orders of the defined types were placed in this encounter.   Meds ordered this encounter  Medications  . ibuprofen (ADVIL) 600 MG tablet    Sig: Take 1 tablet (600 mg total) by mouth every 8 (eight) hours as needed for moderate pain.    Dispense:  30 tablet    Refill:  0     Tim Garlan Fillers, DO 05/04/2019, 10:38 AM PGY-3 [place]

## 2019-05-06 DIAGNOSIS — Z5181 Encounter for therapeutic drug level monitoring: Secondary | ICD-10-CM | POA: Diagnosis not present

## 2019-05-07 ENCOUNTER — Ambulatory Visit: Payer: Medicaid Other | Admitting: Physical Therapy

## 2019-05-07 ENCOUNTER — Encounter: Payer: Self-pay | Admitting: Physical Therapy

## 2019-05-07 ENCOUNTER — Other Ambulatory Visit: Payer: Self-pay

## 2019-05-07 DIAGNOSIS — G8929 Other chronic pain: Secondary | ICD-10-CM | POA: Diagnosis not present

## 2019-05-07 DIAGNOSIS — R293 Abnormal posture: Secondary | ICD-10-CM | POA: Diagnosis not present

## 2019-05-07 DIAGNOSIS — S0300XA Dislocation of jaw, unspecified side, initial encounter: Secondary | ICD-10-CM

## 2019-05-07 DIAGNOSIS — M6281 Muscle weakness (generalized): Secondary | ICD-10-CM | POA: Diagnosis not present

## 2019-05-07 DIAGNOSIS — M549 Dorsalgia, unspecified: Secondary | ICD-10-CM

## 2019-05-07 DIAGNOSIS — M25512 Pain in left shoulder: Secondary | ICD-10-CM | POA: Diagnosis not present

## 2019-05-07 HISTORY — DX: Dislocation of jaw, unspecified side, initial encounter: S03.00XA

## 2019-05-07 NOTE — Therapy (Addendum)
[place], [place], [address] Phone: [phone]   Fax:  [phone]  Physical Therapy Treatment / Discharge  Patient Details  Name: Cindy Howell MRN: [id number] Date of Birth: 1986/04/17 Referring Provider (PT): Kinnie Feil, MD    Encounter Date: 05/07/2019  PT End of Session - 05/07/19 0747    Visit Number  4    Number of Visits  13    Date for PT Re-Evaluation  05/24/19    Authorization Type  MCD: Re-submitted on 3th visit    Authorization - Visit Number  1    Authorization - Number of Visits  4    PT Start Time  [phone]   additional time needed at checkin finishing 6 min late   PT Stop Time  0829    PT Time Calculation (min)  38 min    Activity Tolerance  Patient tolerated treatment well    Behavior During Therapy  [facility] for tasks assessed/performed       Past Medical History:  Diagnosis Date  . Allergy    goat meat, pollen  . Gestational diabetes   . Urticaria     History reviewed. No pertinent surgical history.  There were no vitals filed for this visit.  Subjective Assessment - 05/07/19 0749    Subjective  "I have no pain today, I haven't tried lifting to make sure it is getting better"    Currently in Pain?  No/denies    Pain Score  0-No pain         [facility] PT Assessment - 05/07/19 0001      Assessment   Medical Diagnosis  Acute back pain, unspecified back location, unspecified back pain laterality     Referring Provider (PT)  Kinnie Feil, MD                    [facility] Adult PT Treatment/Exercise - 05/07/19 0754      Therapeutic Activites    Therapeutic Activities  Lifting    Lifting  lifting from floor <> waist 10# 2 x 10,    frequent tactile cues and demonstratoin for form     Elbow Exercises   Elbow Flexion  10 reps;Seated;Strengthening;Right   4#     Shoulder Exercises: Supine   Protraction  Both;15 reps    Other Supine Exercises  scapular  retraction with ER bil 2 x 15 with red theraband      Shoulder Exercises: Seated   Horizontal ABduction  10 reps;Theraband;Both   red theraband     Shoulder Exercises: Prone   Other Prone Exercises  I's T's Y's  2 x 10      Shoulder Exercises: ROM/Strengthening   UBE (Upper Arm Bike)  L2 x 4 min    changing direction at 2 min     Shoulder Exercises: Stretch   Other Shoulder Stretches  posteriro capsule strength 3x20 sec mod cuing; levator stretch 3x20 sec hold; rhomboid sink stretch 3x20 sec hold;     Other Shoulder Stretches  upper trap, Rhmoboid stretch  2 x 30   cues for proper form             PT Education - 05/07/19 0811    Education Details  updated HEP for prone shoulder strengthening    Person(s) Educated  Patient    Methods  Explanation;Verbal cues;Handout    Comprehension  Verbalized understanding;Verbal cues required       PT Short  Term Goals - 04/26/19 1642      PT SHORT TERM GOAL #1   Title  pt to be I with inital HEP    Time  3    Period  Weeks    Status  Achieved      PT SHORT TERM GOAL #2   Title  pt to verbalize/ demo proper posture and liftingmechanics to reduce and prevent shoulder pain    Time  3    Period  Weeks    Status  Achieved        PT Long Term Goals - 04/26/19 1642      PT LONG TERM GOAL #1   Title  pt to increase L shoulder strength to >/= 4+/5 in all planes to promote shoulder stability and biomechanics    Baseline  l shoulder flexion and abduction 4/5 (with reproduction of concordant pain    Time  6    Period  Weeks    Status  On-going      PT LONG TERM GOAL #2   Title  pt to be able to lift/ lower >/=15#  to and from the floor and over head, to promote being able to carry children at home with no report of pain.    Baseline  pain at 6-7/10 with carrying objects such as baby carrier and children, but notes it is improving    Time  6    Period  Weeks    Status  On-going      PT LONG TERM GOAL #3   Title  pt to be I  with all HEP given as of last visit to maintain and progress current level of function    Baseline  independent with current HEP and progressing as able.    Time  6    Period  Weeks    Status  On-going            Plan - 05/07/19 0804    Clinical Impression Statement  pt notes no pain today but hasn't attempted lifting since the last session. She does report being consiset with her HEP. she was able to do all exercises with frequent verbal/ tactile cues for proper form due to misunderstanding. practiced lifting mechanics requiring heavy tactile cues and demonstration for proper form.    PT Next Visit Plan  review/ update HEP, STW along Rhomboids, and stretching rhomboids/ upper trap,  shoulder strengthening, posture education, STW PRN    PT Home Exercise Plan  Rows, upper trap stretch, shoulder IR/ER with theraband, horizonal abduction, lower trap wall y's, I, Y and T's    Consulted and Agree with Plan of Care  Patient       Patient will benefit from skilled therapeutic intervention in order to improve the following deficits and impairments:     Visit Diagnosis: Dorsalgia  Chronic left shoulder pain  Muscle weakness (generalized)  Abnormal posture     Problem List Patient Active Problem List   Diagnosis Date Noted  . Left ear pain 04/16/2019  . Toothache 04/09/2019  . Gingivitis 03/23/2019  . Lesion of tongue 03/23/2019  . Back pain 02/26/2019  . GERD (gastroesophageal reflux disease) 02/09/2019  . Spider bite 12/24/2018  . History of depression 11/02/2018  . Hx of preeclampsia, prior pregnancy, currently pregnant 10/30/2018  . Gestational diabetes mellitus (GDM) controlled on oral hypoglycemic drug 10/30/2018  . Melasma 09/24/2018  . Abnormal glucose tolerance test (GTT) during pregnancy, antepartum 09/08/2018  . History of food allergy  03/31/2018  . Rash 03/31/2018  . 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 Itching 02/19/2018  . Weight gain 02/06/2018  . Allergic rhinitis  01/23/2018   Starr Lake PT, DPT, LAT, ATC  05/07/19  8:30 AM      [facility] [address] [place], [place], [address] Phone: [phone]   Fax:  [phone]  Name: Cindy Howell MRN: [id number] Date of Birth: [birth year]       PHYSICAL THERAPY DISCHARGE SUMMARY  Visits from Start of Care: 4  Current functional level related to goals / functional outcomes: See goals   Remaining deficits: unknown   Education / Equipment: HEP  Plan: Patient agrees to discharge.  Patient goals were partially met. Patient is being discharged due to not returning since the last visit.  ?????          Mililani Murthy PT, DPT, LAT, ATC  06/28/19  10:05 AM

## 2019-05-07 NOTE — Assessment & Plan Note (Signed)
Patient most likely has TMJ given she has been seen by several dentist and no orthodontic issue has been found. Her symptoms of right jaw pain radiating to the ear are consistent with TMJ. Given it has been ongoing for a month with no fever or other systemic symptoms do not suspect mastoiditis or other acute infection. - Ibuprofen 600mg  TID prn - At home exercises given - F/u as needed

## 2019-05-12 DIAGNOSIS — H5213 Myopia, bilateral: Secondary | ICD-10-CM | POA: Diagnosis not present

## 2019-05-13 DIAGNOSIS — Z5181 Encounter for therapeutic drug level monitoring: Secondary | ICD-10-CM | POA: Diagnosis not present

## 2019-05-14 ENCOUNTER — Ambulatory Visit: Payer: Medicaid Other | Admitting: Physical Therapy

## 2019-05-19 ENCOUNTER — Telehealth: Payer: Self-pay | Admitting: Physical Therapy

## 2019-05-19 NOTE — Telephone Encounter (Signed)
Spoke with patient regarding missed visit on Friday. Patient reports she forgot the time. She will be here on Friday.

## 2019-05-20 DIAGNOSIS — Z5181 Encounter for therapeutic drug level monitoring: Secondary | ICD-10-CM | POA: Diagnosis not present

## 2019-05-21 ENCOUNTER — Ambulatory Visit: Payer: Medicaid Other | Admitting: Physical Therapy

## 2019-05-25 ENCOUNTER — Ambulatory Visit
Admission: RE | Admit: 2019-05-25 | Discharge: 2019-05-25 | Disposition: A | Discharge status: Home / Self Care | Payer: Medicaid Other | Source: Ambulatory Visit | Attending: Family Medicine | Admitting: Family Medicine

## 2019-05-25 ENCOUNTER — Other Ambulatory Visit: Payer: Self-pay

## 2019-05-25 ENCOUNTER — Other Ambulatory Visit: Payer: Self-pay | Admitting: Family Medicine

## 2019-05-25 DIAGNOSIS — N6323 Unspecified lump in the left breast, lower outer quadrant: Secondary | ICD-10-CM | POA: Diagnosis not present

## 2019-05-25 DIAGNOSIS — Z5181 Encounter for therapeutic drug level monitoring: Secondary | ICD-10-CM | POA: Diagnosis not present

## 2019-05-25 DIAGNOSIS — N6320 Unspecified lump in the left breast, unspecified quadrant: Secondary | ICD-10-CM

## 2019-05-28 ENCOUNTER — Ambulatory Visit
Admission: RE | Admit: 2019-05-28 | Discharge: 2019-05-28 | Disposition: A | Discharge status: Home / Self Care | Payer: Medicaid Other | Source: Ambulatory Visit | Attending: Family Medicine | Admitting: Family Medicine

## 2019-05-28 ENCOUNTER — Ambulatory Visit: Payer: Medicaid Other | Admitting: Physical Therapy

## 2019-05-28 ENCOUNTER — Other Ambulatory Visit: Payer: Self-pay

## 2019-05-28 DIAGNOSIS — N649 Disorder of breast, unspecified: Secondary | ICD-10-CM | POA: Diagnosis not present

## 2019-05-28 DIAGNOSIS — N6342 Unspecified lump in left breast, subareolar: Secondary | ICD-10-CM | POA: Diagnosis not present

## 2019-05-28 DIAGNOSIS — N6320 Unspecified lump in the left breast, unspecified quadrant: Secondary | ICD-10-CM

## 2019-05-31 DIAGNOSIS — Z5181 Encounter for therapeutic drug level monitoring: Secondary | ICD-10-CM | POA: Diagnosis not present

## 2019-06-07 DIAGNOSIS — H5213 Myopia, bilateral: Secondary | ICD-10-CM | POA: Diagnosis not present

## 2019-06-07 DIAGNOSIS — Z5181 Encounter for therapeutic drug level monitoring: Secondary | ICD-10-CM | POA: Diagnosis not present

## 2019-06-11 ENCOUNTER — Ambulatory Visit: Payer: Self-pay | Admitting: General Surgery

## 2019-06-11 DIAGNOSIS — D242 Benign neoplasm of left breast: Secondary | ICD-10-CM | POA: Diagnosis not present

## 2019-06-15 DIAGNOSIS — Z5181 Encounter for therapeutic drug level monitoring: Secondary | ICD-10-CM | POA: Diagnosis not present

## 2019-06-18 ENCOUNTER — Other Ambulatory Visit: Payer: Self-pay | Admitting: General Surgery

## 2019-06-18 DIAGNOSIS — D242 Benign neoplasm of left breast: Secondary | ICD-10-CM

## 2019-06-21 ENCOUNTER — Ambulatory Visit: Payer: Medicaid Other

## 2019-06-21 DIAGNOSIS — Z5181 Encounter for therapeutic drug level monitoring: Secondary | ICD-10-CM | POA: Diagnosis not present

## 2019-06-24 DIAGNOSIS — H5213 Myopia, bilateral: Secondary | ICD-10-CM | POA: Diagnosis not present

## 2019-06-25 ENCOUNTER — Ambulatory Visit (INDEPENDENT_AMBULATORY_CARE_PROVIDER_SITE_OTHER): Payer: Medicaid Other | Admitting: *Deleted

## 2019-06-25 ENCOUNTER — Other Ambulatory Visit: Payer: Self-pay

## 2019-06-25 DIAGNOSIS — Z23 Encounter for immunization: Secondary | ICD-10-CM

## 2019-06-28 DIAGNOSIS — Z5181 Encounter for therapeutic drug level monitoring: Secondary | ICD-10-CM | POA: Diagnosis not present

## 2019-07-06 DIAGNOSIS — Z5181 Encounter for therapeutic drug level monitoring: Secondary | ICD-10-CM | POA: Diagnosis not present

## 2019-07-14 DIAGNOSIS — Z5181 Encounter for therapeutic drug level monitoring: Secondary | ICD-10-CM | POA: Diagnosis not present

## 2019-07-19 ENCOUNTER — Telehealth (INDEPENDENT_AMBULATORY_CARE_PROVIDER_SITE_OTHER): Payer: Medicaid Other | Admitting: Family Medicine

## 2019-07-19 ENCOUNTER — Other Ambulatory Visit: Payer: Self-pay

## 2019-07-19 ENCOUNTER — Telehealth: Payer: Self-pay

## 2019-07-19 DIAGNOSIS — R5383 Other fatigue: Secondary | ICD-10-CM

## 2019-07-19 DIAGNOSIS — R635 Abnormal weight gain: Secondary | ICD-10-CM

## 2019-07-19 NOTE — Telephone Encounter (Signed)
194#  120/78  [place] [employer] - [address]

## 2019-07-19 NOTE — Progress Notes (Signed)
[facility] Visit  Patient consented to have virtual visit. Method of visit: Video  Encounter participants: Patient: Cindy Howell - located at home Provider: Bonnita Hollow - located at office Others (if applicable): none  Chief Complaint: Weight gain, fatigue  HPI: Patient is complaining of fatigue, weight gain since the birth of her daughter in February.  During that pregnancy patient had gestational diabetes, remainder of pregnancy was without issue.  Patient is concerned that she has thyroid issues.  She had this from her friend who had thyroid issues.  She would also like her cholesterol checked.  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  She is getting labs drawn on November 6 for presurgical evaluation.  Patient denies fevers or chills, constipation or diarrhea.  She denies history of thyroid, cholesterol issues.  Patient last weight 189 pounds in 04/2019, slightly down from 07/31 191 pounds.  Historical weight appears to be around 175 to 180 pounds.  Patient wishes to be seen before her lab draws on November 6.  ROS: per HPI  Pertinent PMHx: History of gestational diabetes, borderline anemia of 10.8 in February 2020  Exam:  General: Appears in no distress Respiratory: Able to speak full sentence without issue Skin: No rash of the face Neuro: Moves all extremities spontaneously, no facial dysmetria Psych: Normal thought content and affect  Assessment/Plan: Fatigue and weight gain Approximately 10 pound weight gain since February.  Endorses more weight gain at home.  Also endorses fatigue.  Patient concerned that it might be her thyroid, also concerned about her cholesterol.  TSH is not reasonable to check, but unlikely to be positive for hypothyroidism given vague symptoms with only weight gain and fatigue.  Patient did have mild anemia at her last CBC in February, could recheck this to  assess for fatigue.  Would also need a good physical exam to evaluate for other abnormalities.  Patient could also use a postpartum A1c to assess for ongoing diabetes as this may also cause fatigue, especially in the setting of polyuria and polydipsia, although one would not expect weight gain with this.  Although rare, she might have postpartum cardiomyopathy leading to heart failure.  Given the chronicity of this, its unlikely but would be prudent to assess for this as well on physical exam.  Schedule patient to be seen in access to care clinic on 11/4 at 8:30 a.m.  Time spent during visit with patient: 12 minutes

## 2019-07-21 ENCOUNTER — Other Ambulatory Visit: Payer: Self-pay

## 2019-07-21 ENCOUNTER — Ambulatory Visit (INDEPENDENT_AMBULATORY_CARE_PROVIDER_SITE_OTHER): Payer: Medicaid Other | Admitting: Family Medicine

## 2019-07-21 VITALS — BP 130/60 | HR 105 | Wt 191.4 lb

## 2019-07-21 DIAGNOSIS — Z8632 Personal history of gestational diabetes: Secondary | ICD-10-CM

## 2019-07-21 DIAGNOSIS — O24415 Gestational diabetes mellitus in pregnancy, controlled by oral hypoglycemic drugs: Secondary | ICD-10-CM | POA: Diagnosis not present

## 2019-07-21 DIAGNOSIS — K219 Gastro-esophageal reflux disease without esophagitis: Secondary | ICD-10-CM

## 2019-07-21 DIAGNOSIS — R5383 Other fatigue: Secondary | ICD-10-CM | POA: Diagnosis not present

## 2019-07-21 DIAGNOSIS — Z83438 Family history of other disorder of lipoprotein metabolism and other lipidemia: Secondary | ICD-10-CM | POA: Insufficient documentation

## 2019-07-21 DIAGNOSIS — J302 Other seasonal allergic rhinitis: Secondary | ICD-10-CM | POA: Diagnosis not present

## 2019-07-21 DIAGNOSIS — Z5181 Encounter for therapeutic drug level monitoring: Secondary | ICD-10-CM | POA: Diagnosis not present

## 2019-07-21 MED ORDER — FLUTICASONE PROPIONATE 50 MCG/ACT NA SUSP: 1.0000 | Freq: Every day | NASAL | 2 refills | Status: DC

## 2019-07-21 NOTE — Assessment & Plan Note (Signed)
Conservative measures discussed.  Advised dietary as well as lifestyle changes.  Information provided and after visit summary.

## 2019-07-21 NOTE — Assessment & Plan Note (Signed)
Patient with a history of gestational diabetes.  Will obtain A1c to rule out current diabetes but gestational diabetes puts her at high risk of developing type 2 diabetes.

## 2019-07-21 NOTE — Assessment & Plan Note (Signed)
Unclear etiology at this time.  Can consider thyroid disorder given that patient has mild thyromegaly as well as family history of thyroid disease.  Patient is getting adequate sleep so this is not a cause.  Can consider anemia as patient has previously been anemic in the past, hemoglobin in 11/01/2018 10.8.  At the end of February 2020 hemoglobin was back up to 12. Can consider electrolyte abnormality as well.  Will obtain CBC and BMP to rule this out.  Will also obtain TSH as well as T4.  Advised to follow-up in 1 month so we can ensure this is improving.  PHQ 9 within normal limits so unlikely depression.  Would recommend a repeat PHQ-9 at 1 month follow-up.  Return precautions given.

## 2019-07-21 NOTE — Progress Notes (Signed)
Subjective:    Patient ID: Cindy Howell, female    DOB: 1986-01-18, 33 y.o.   MRN: [id number]   CC: fatigue, GERD, cholesterol   HPI: Fatigue Patient presenting with concerns of fatigue.  States that she feels tired all the time.  States that she has 2 young children and feels like this may interfere with having to care with them.  Patient states that this fatigue has been occurring for 1 month now.  Also reports some weight gain.  Was 175 pounds at the end of her pregnancy 8 months ago, is now up to 190.  States that she does know she is not following a strict diet as she tends to eat lots of oily foods and also increase rice and increase fried food.  Denies any physical activity but does run around after 2 children and also does household chores.  Denies any hair or skin changes.  Denies any melena or other blood.  Denies any edema.  Mother has a history of thyroid disease, is unsure if this is hypo or hyperthyroidism.  Does deny any history of thyroid cancer in the family.  Reports she gets at least 7 hours of sleep at night and sometimes can take a nap that is 30 minutes to an hour.  Despite sleeping well she is always tired.  GERD Patient reports she has a history of GERD and is on omeprazole 20 mg.  Does report that if she eats oily food she still gets heartburn.  Is trying to not be on medications but she does not like to rely on them.  Is wondering what she can do to help prevent GERD without taking medications.  Cholesterol  Patient reports she would like her cholesterol checked.  States that her mother, father, brother all have a history of high cholesterol.  Mother and father also have coronary artery disease.  Father had an MI at age 25.  States that she eats a poor diet including high fried foods and oily foods.  States that she is trying to start eating healthier by instead of white rice eating brown rice and white bread eating grain bread.  Objective:  BP 130/60   Pulse (!) 105    Wt 191 lb 6.4 oz (86.8 kg)   SpO2 99%   BMI 32.85 kg/m  Vitals and nursing note reviewed  General: well nourished, in no acute distress HEENT: normocephalic, no scleral icterus or conjunctival pallor  Neck: supple, non-tender, without lymphadenopathy, mild thyromegaly  Cardiac: RRR, clear S1 and S2, no murmurs, rubs, or gallops Respiratory: clear to auscultation bilaterally, no increased work of breathing Abdomen: soft, nontender, nondistended, no masses or organomegaly. Bowel sounds present Extremities: no edema or cyanosis. Warm, well perfused. 2+ radial pulses bilaterally Skin: warm and dry, no rashes noted Neuro: alert and oriented, no focal deficits   Assessment & Plan:    Fatigue Unclear etiology at this time.  Can consider thyroid disorder given that patient has mild thyromegaly as well as family history of thyroid disease.  Patient is getting adequate sleep so this is not a cause.  Can consider anemia as patient has previously been anemic in the past, hemoglobin in 11/01/2018 10.8.  At the end of February 2020 hemoglobin was back up to 12. Can consider electrolyte abnormality as well.  Will obtain CBC and BMP to rule this out.  Will also obtain TSH as well as T4.  Advised to follow-up in 1 month so we can ensure this is  improving.  PHQ 9 within normal limits so unlikely depression.  Would recommend a repeat PHQ-9 at 1 month follow-up.  Return precautions given.  GERD (gastroesophageal reflux disease) Conservative measures discussed.  Advised dietary as well as lifestyle changes.  Information provided and after visit summary.  Family history of hyperlipidemia Patient has significant family history of hyperlipidemia as well as has coronary artery disease.  Father with MI at age 12.  Patient is very concerned about her cholesterol especially given her poor diet.  I will obtain a fasting lipid panel.  Patient has already eaten today so advised her to follow-up tomorrow morning for a  lab visit.  Gestational diabetes mellitus (GDM) controlled on oral hypoglycemic drug Patient with a history of gestational diabetes.  Will obtain A1c to rule out current diabetes but gestational diabetes puts her at high risk of developing type 2 diabetes.   Discussed with Dr. Gwendlyn Deutscher  Return in about 4 weeks (around 08/18/2019).   Caroline More, DO, PGY-3

## 2019-07-21 NOTE — Assessment & Plan Note (Signed)
Patient has significant family history of hyperlipidemia as well as has coronary artery disease.  Father with MI at age 31.  Patient is very concerned about her cholesterol especially given her poor diet.  I will obtain a fasting lipid panel.  Patient has already eaten today so advised her to follow-up tomorrow morning for a lab visit.

## 2019-07-21 NOTE — Patient Instructions (Signed)
Diet Recommendations for Diabetes   Starchy (carb) foods: Bread, rice, pasta, potatoes, corn, cereal, grits, crackers, bagels, muffins, all baked goods.  (Fruits, milk, and yogurt also have carbohydrate, but most of these foods will not spike your blood sugar as the starchy foods will.)  A few fruits do cause high blood sugars; use small portions of bananas (limit to 1/2 at a time), grapes, watermelon, oranges, and most tropical fruits.    Protein foods: Meat, fish, poultry, eggs, dairy foods, and beans such as pinto and kidney beans (beans also provide carbohydrate).   1. Eat at least 3 meals and 1-2 snacks per day. Never go more than 4-5 hours while awake without eating. Eat breakfast within the first hour of getting up.   2. Limit starchy foods to TWO per meal and ONE per snack. ONE portion of a starchy  food is equal to the following:   - ONE slice of bread (or its equivalent, such as half of a hamburger bun).   - 1/2 cup of a "scoopable" starchy food such as potatoes or rice.   - 15 grams of carbohydrate as shown on food label.  3. Include at every meal: a protein food, a carb food, and vegetables and/or fruit.   - Obtain twice the volume of veg's as protein or carbohydrate foods for both lunch and dinner.   - Fresh or frozen veg's are best.   - Keep frozen veg's on hand for a quick vegetable serving.         Gastroesophageal Reflux Disease, Adult Gastroesophageal reflux (GER) happens when acid from the stomach flows up into the tube that connects the mouth and the stomach (esophagus). Normally, food travels down the esophagus and stays in the stomach to be digested. With GER, food and stomach acid sometimes move back up into the esophagus. You may have a disease called gastroesophageal reflux disease (GERD) if the reflux:  Happens often.  Causes frequent or very bad symptoms.  Causes problems such as damage to the esophagus. When this happens, the esophagus becomes sore and swollen  (inflamed). Over time, GERD can make small holes (ulcers) in the lining of the esophagus. What are the causes? This condition is caused by a problem with the muscle between the esophagus and the stomach. When this muscle is weak or not normal, it does not close properly to keep food and acid from coming back up from the stomach. The muscle can be weak because of:  Tobacco use.  Pregnancy.  Having a certain type of hernia (hiatal hernia).  Alcohol use.  Certain foods and drinks, such as coffee, chocolate, onions, and peppermint. What increases the risk? You are more likely to develop this condition if you:  Are overweight.  Have a disease that affects your connective tissue.  Use NSAID medicines. What are the signs or symptoms? Symptoms of this condition include:  Heartburn.  Difficult or painful swallowing.  The feeling of having a lump in the throat.  A bitter taste in the mouth.  Bad breath.  Having a lot of saliva.  Having an upset or bloated stomach.  Belching.  Chest pain. Different conditions can cause chest pain. Make sure you see your doctor if you have chest pain.  Shortness of breath or noisy breathing (wheezing).  Ongoing (chronic) cough or a cough at night.  Wearing away of the surface of teeth (tooth enamel).  Weight loss. How is this treated? Treatment will depend on how bad your symptoms are.  Your doctor may suggest:  Changes to your diet.  Medicine.  Surgery. Follow these instructions at home: Eating and drinking   Follow a diet as told by your doctor. You may need to avoid foods and drinks such as: ? Coffee and tea (with or without caffeine). ? Drinks that contain alcohol. ? Energy drinks and sports drinks. ? Bubbly (carbonated) drinks or sodas. ? Chocolate and cocoa. ? Peppermint and mint flavorings. ? Garlic and onions. ? Horseradish. ? Spicy and acidic foods. These include peppers, chili powder, curry powder, vinegar, hot  sauces, and BBQ sauce. ? Citrus fruit juices and citrus fruits, such as oranges, lemons, and limes. ? Tomato-based foods. These include red sauce, chili, salsa, and pizza with red sauce. ? Fried and fatty foods. These include donuts, french fries, potato chips, and high-fat dressings. ? High-fat meats. These include hot dogs, rib eye steak, sausage, ham, and bacon. ? High-fat dairy items, such as whole milk, butter, and cream cheese.  Eat small meals often. Avoid eating large meals.  Avoid drinking large amounts of liquid with your meals.  Avoid eating meals during the 2-3 hours before bedtime.  Avoid lying down right after you eat.  Do not exercise right after you eat. Lifestyle   Do not use any products that contain nicotine or tobacco. These include cigarettes, e-cigarettes, and chewing tobacco. If you need help quitting, ask your doctor.  Try to lower your stress. If you need help doing this, ask your doctor.  If you are overweight, lose an amount of weight that is healthy for you. Ask your doctor about a safe weight loss goal. General instructions  Pay attention to any changes in your symptoms.  Take over-the-counter and prescription medicines only as told by your doctor. Do not take aspirin, ibuprofen, or other NSAIDs unless your doctor says it is okay.  Wear loose clothes. Do not wear anything tight around your waist.  Raise (elevate) the head of your bed about 6 inches (15 cm).  Avoid bending over if this makes your symptoms worse.  Keep all follow-up visits as told by your doctor. This is important. Contact a doctor if:  You have new symptoms.  You lose weight and you do not know why.  You have trouble swallowing or it hurts to swallow.  You have wheezing or a cough that keeps happening.  Your symptoms do not get better with treatment.  You have a hoarse voice. Get help right away if:  You have pain in your arms, neck, jaw, teeth, or back.  You feel  sweaty, dizzy, or light-headed.  You have chest pain or shortness of breath.  You throw up (vomit) and your throw-up looks like blood or coffee grounds.  You pass out (faint).  Your poop (stool) is bloody or black.  You cannot swallow, drink, or eat. Summary  If a person has gastroesophageal reflux disease (GERD), food and stomach acid move back up into the esophagus and cause symptoms or problems such as damage to the esophagus.  Treatment will depend on how bad your symptoms are.  Follow a diet as told by your doctor.  Take all medicines only as told by your doctor. This information is not intended to replace advice given to you by your health care provider. Make sure you discuss any questions you have with your health care provider. Document Released: 02/19/2008 Document Revised: 03/11/2018 Document Reviewed: 03/11/2018 Elsevier Patient Education  2020 [employer].

## 2019-07-22 ENCOUNTER — Other Ambulatory Visit: Payer: Medicaid Other

## 2019-07-22 DIAGNOSIS — Z8632 Personal history of gestational diabetes: Secondary | ICD-10-CM | POA: Diagnosis not present

## 2019-07-22 DIAGNOSIS — R5383 Other fatigue: Secondary | ICD-10-CM

## 2019-07-22 NOTE — Pre-Procedure Instructions (Signed)
[facility] ([place]), [place] - [address] [place] ([place]) [place] [address] Phone: [phone] Fax: [phone]     Your procedure is scheduled on Wednesday November 11th.  Report to [facility] Main Entrance "A" at 6:30 A.M., and check in at the Admitting office.  Call this number if you have problems the morning of surgery:  [phone]  Call [phone] if you have any questions prior to your surgery date Monday-Friday 8am-4pm    Remember:  Do not eat after midnight the night before your surgery  You may drink clear liquids until 5:30 the morning of your surgery.   Clear liquids allowed are: Water, Non-Citrus Juices (without pulp), Carbonated Beverages, Clear Tea, Black Coffee Only, and Gatorade    Take these medicines the morning of surgery with A SIP OF WATER  omeprazole (PRILOSEC)  fluticasone (FLONASE)   As of today, STOP taking any Aspirin (unless otherwise instructed by your surgeon), Aleve, Naproxen, Ibuprofen, Motrin, Advil, Goody's, BC's, all herbal medications, fish oil, and all vitamins.    The Morning of Surgery  Do not wear jewelry, make-up or nail polish.  Do not wear lotions, powders, or perfumes/colognes, or deodorant  Do not shave 48 hours prior to surgery.  Men may shave face and neck.  Do not bring valuables to the hospital.  [facility] is not responsible for any belongings or valuables.  If you are a smoker, DO NOT Smoke 24 hours prior to surgery IF you wear a CPAP at night please bring your mask, tubing, and machine the morning of surgery   Remember that you must have someone to transport you home after your surgery, and remain with you for 24 hours if you are discharged the same day.   Contacts, glasses, hearing aids, dentures or bridgework may not be worn into surgery.    Leave your suitcase in the car.  After surgery it may be brought to your room.  For patients admitted to the hospital,  discharge time will be determined by your treatment team.  Patients discharged the day of surgery will not be allowed to drive home.    Special instructions:   [facility]- Preparing For Surgery  Before surgery, you can play an important role. Because skin is not sterile, your skin needs to be as free of germs as possible. You can reduce the number of germs on your skin by washing with CHG (chlorahexidine gluconate) Soap before surgery.  CHG is an antiseptic cleaner which kills germs and bonds with the skin to continue killing germs even after washing.    Oral Hygiene is also important to reduce your risk of infection.  Remember - BRUSH YOUR TEETH THE MORNING OF SURGERY WITH YOUR REGULAR TOOTHPASTE  Please do not use if you have an allergy to CHG or antibacterial soaps. If your skin becomes reddened/irritated stop using the CHG.  Do not shave (including legs and underarms) for at least 48 hours prior to first CHG shower. It is OK to shave your face.  Please follow these instructions carefully.   1. Shower the NIGHT BEFORE SURGERY and the MORNING OF SURGERY with CHG Soap.   2. If you chose to wash your hair, wash your hair first as usual with your normal shampoo.  3. After you shampoo, rinse your hair and body thoroughly to remove the shampoo.  4. Use CHG as you would any other liquid soap. You can apply CHG directly to the skin  and wash gently with a scrungie or a clean washcloth.   5. Apply the CHG Soap to your body ONLY FROM THE NECK DOWN.  Do not use on open wounds or open sores. Avoid contact with your eyes, ears, mouth and genitals (private parts). Wash Face and genitals (private parts)  with your normal soap.   6. Wash thoroughly, paying special attention to the area where your surgery will be performed.  7. Thoroughly rinse your body with warm water from the neck down.  8. DO NOT shower/wash with your normal soap after using and rinsing off the CHG Soap.  9. Pat yourself dry  with a CLEAN TOWEL.  10. Wear CLEAN PAJAMAS to bed the night before surgery, wear comfortable clothes the morning of surgery  11. Place CLEAN SHEETS on your bed the night of your first shower and DO NOT SLEEP WITH PETS.    Day of Surgery:  Do not apply any deodorants/lotions. Please shower the morning of surgery with the CHG soap  Please wear clean clothes to the hospital/surgery center.   Remember to brush your teeth WITH YOUR REGULAR TOOTHPASTE.   Please read over the following fact sheets that you were given.

## 2019-07-23 ENCOUNTER — Encounter (HOSPITAL_COMMUNITY)
Admission: RE | Admit: 2019-07-23 | Discharge: 2019-07-23 | Disposition: A | Discharge status: Home / Self Care | Payer: Medicaid Other | Source: Ambulatory Visit | Attending: General Surgery | Admitting: General Surgery

## 2019-07-23 ENCOUNTER — Telehealth: Payer: Self-pay | Admitting: Family Medicine

## 2019-07-23 ENCOUNTER — Other Ambulatory Visit: Payer: Self-pay

## 2019-07-23 ENCOUNTER — Encounter (HOSPITAL_COMMUNITY): Payer: Self-pay

## 2019-07-23 ENCOUNTER — Telehealth: Payer: Self-pay

## 2019-07-23 DIAGNOSIS — Z8632 Personal history of gestational diabetes: Secondary | ICD-10-CM | POA: Insufficient documentation

## 2019-07-23 DIAGNOSIS — D242 Benign neoplasm of left breast: Secondary | ICD-10-CM | POA: Insufficient documentation

## 2019-07-23 DIAGNOSIS — Z79899 Other long term (current) drug therapy: Secondary | ICD-10-CM | POA: Insufficient documentation

## 2019-07-23 DIAGNOSIS — R7303 Prediabetes: Secondary | ICD-10-CM | POA: Diagnosis not present

## 2019-07-23 DIAGNOSIS — Z7951 Long term (current) use of inhaled steroids: Secondary | ICD-10-CM | POA: Insufficient documentation

## 2019-07-23 DIAGNOSIS — Z975 Presence of (intrauterine) contraceptive device: Secondary | ICD-10-CM | POA: Insufficient documentation

## 2019-07-23 DIAGNOSIS — Z01818 Encounter for other preprocedural examination: Secondary | ICD-10-CM | POA: Insufficient documentation

## 2019-07-23 HISTORY — DX: Prediabetes: R73.03

## 2019-07-23 LAB — BASIC METABOLIC PANEL
BUN/Creatinine Ratio: 24 — ABNORMAL HIGH (ref 9–23)
BUN: 12 mg/dL (ref 6–20)
CO2: 21 mmol/L (ref 20–29)
Calcium: 9.2 mg/dL (ref 8.7–10.2)
Chloride: 106 mmol/L (ref 96–106)
Creatinine, Ser: 0.5 mg/dL — ABNORMAL LOW (ref 0.57–1.00)
GFR calc Af Amer: 149 mL/min/{1.73_m2} (ref >59)
GFR calc non Af Amer: 129 mL/min/{1.73_m2} (ref >59)
Glucose: 123 mg/dL — ABNORMAL HIGH (ref 65–99)
Potassium: 4.3 mmol/L (ref 3.5–5.2)
Sodium: 139 mmol/L (ref 134–144)

## 2019-07-23 LAB — CBC
Hematocrit: 40.1 % (ref 34.0–46.6)
Hemoglobin: 13.5 g/dL (ref 11.1–15.9)
MCH: 28.8 pg (ref 26.6–33.0)
MCHC: 33.7 g/dL (ref 31.5–35.7)
MCV: 86 fL (ref 79–97)
Platelets: 273 10*3/uL (ref 150–450)
RBC: 4.69 x10E6/uL (ref 3.77–5.28)
RDW: 12.4 % (ref 11.7–15.4)
WBC: 7.6 10*3/uL (ref 3.4–10.8)

## 2019-07-23 LAB — LIPID PANEL
Chol/HDL Ratio: 3.4 ratio (ref 0.0–4.4)
Cholesterol, Total: 130 mg/dL (ref 100–199)
HDL: 38 mg/dL — ABNORMAL LOW (ref >39)
LDL Chol Calc (NIH): 77 mg/dL (ref 0–99)
Triglycerides: 75 mg/dL (ref 0–149)
VLDL Cholesterol Cal: 15 mg/dL (ref 5–40)

## 2019-07-23 LAB — TSH+FREE T4
Free T4: 1.43 ng/dL (ref 0.82–1.77)
TSH: 1.31 u[IU]/mL (ref 0.450–4.500)

## 2019-07-23 LAB — HEMOGLOBIN A1C
Est. average glucose Bld gHb Est-mCnc: 123 mg/dL
Hgb A1c MFr Bld: 5.9 % — ABNORMAL HIGH (ref 4.8–5.6)

## 2019-07-23 LAB — POCT PREGNANCY, URINE: Preg Test, Ur: NEGATIVE

## 2019-07-23 LAB — GLUCOSE, CAPILLARY: Glucose-Capillary: 142 mg/dL — ABNORMAL HIGH (ref 70–99)

## 2019-07-23 NOTE — Telephone Encounter (Signed)
Patient is waiting for lab results.  She received a call earlier when she was in the hospital so please give her a call back with this as soon as you can, thanks. Best number to reach her is [phone].

## 2019-07-23 NOTE — Telephone Encounter (Addendum)
Attempted to call patient with lab results and her husband states that she is here at [facility].    Patient does not have an appointment here today.  No message was left using [facility] ID # [id number].  Will try again later to reach patient.  Cindy Howell, [place]

## 2019-07-23 NOTE — Progress Notes (Signed)
PCP - Dr. Tammi Klippel Cardiologist - n/a  PPM/ICD - n/a Device Orders -  Rep Notified -   Chest x-ray - n/a EKG - n/a Stress Test - patient denies ECHO - patient denies Cardiac Cath - patient denies  Sleep Study - patient denies CPAP -   Fasting Blood Sugar - n/a Checks Blood Sugar _____ times a day  Blood Thinner Instructions: n/a Aspirin Instructions:  ERAS Protcol - clears until 05:30 PRE-SURGERY Ensure or G2- no drink ordered  COVID TEST- 07/24/2019   Anesthesia review: yes, scheduled for seed placement 07/27/19  Patient denies shortness of breath, fever, cough and chest pain at PAT appointment   All instructions explained to the patient, with a verbal understanding of the material. Patient agrees to go over the instructions while at home for a better understanding. Patient also instructed to self quarantine after being tested for COVID-19. The opportunity to ask questions was provided.

## 2019-07-23 NOTE — Telephone Encounter (Signed)
Called patient with results.    .Cindy Howell R Cindy Howell, CMA  

## 2019-07-24 ENCOUNTER — Other Ambulatory Visit (HOSPITAL_COMMUNITY): Payer: Medicaid Other

## 2019-07-24 ENCOUNTER — Other Ambulatory Visit (HOSPITAL_COMMUNITY)
Admission: RE | Admit: 2019-07-24 | Discharge: 2019-07-24 | Disposition: A | Discharge status: Home / Self Care | Payer: Medicaid Other | Source: Ambulatory Visit | Attending: General Surgery | Admitting: General Surgery

## 2019-07-24 DIAGNOSIS — Z01812 Encounter for preprocedural laboratory examination: Secondary | ICD-10-CM | POA: Diagnosis not present

## 2019-07-24 DIAGNOSIS — Z20828 Contact with and (suspected) exposure to other viral communicable diseases: Secondary | ICD-10-CM | POA: Insufficient documentation

## 2019-07-25 LAB — NOVEL CORONAVIRUS, NAA (HOSP ORDER, SEND-OUT TO REF LAB; TAT 18-24 HRS): SARS-CoV-2, NAA: NOT DETECTED

## 2019-07-26 NOTE — Progress Notes (Signed)
Anesthesia Chart Review:  Case: [id number] Date/Time: 07/28/19 0815   Procedure: LEFT BREAST LUMPECTOMY WITH RADIOACTIVE SEED LOCALIZATION (Left Breast)   Anesthesia type: General   Pre-op diagnosis: LEFT BREAST PAPILLOMA   Location: [facility] OR ROOM 10 / [facility]   Surgeon: Jovita Kussmaul, MD     RLS scheduled for 07/27/19 at 1:15 PM.  DISCUSSION: Patient is a 33 year old female scheduled for the above procedure.  History includes never smoker, left breast papilloma (05/28/19 biopsy), gestational diabetes, pre-diabetes. BMI is consistent with obesity.   Presurgical 07/24/19 COVID-19 test negative. 07/23/19 urine pregnancy test negative (done at PAT due to RSL implant scheduled for 07/27/19). If no acute changes then I anticipate that she can proceed as planned.    VS: BP 124/69   Pulse (!) 101   Temp 36.8 C (Oral)   Resp 20   Ht 5\' 4"  (1.626 m)   Wt 86.4 kg   SpO2 100%   BMI 32.69 kg/m   PROVIDERS: Nuala Alpha, DO is listed as PCP ([facility]). Last visit Caroline More, DO on 07/21/19.    LABS: Reviewed labs done at PCP 07/22/19 and from 07/23/19 PAT visit. Results include:  Lab Results  Component Value Date   WBC 7.6 07/22/2019   HGB 13.5 07/22/2019   HCT 40.1 07/22/2019   PLT 273 07/22/2019   GLUCOSE 123 (H) 07/22/2019   CHOL 130 07/22/2019   TRIG 75 07/22/2019   HDL 38 (L) 07/22/2019   LDLCALC 77 07/22/2019   ALT 40 11/05/2018   AST 25 11/05/2018   NA 139 07/22/2019   K 4.3 07/22/2019   CL 106 07/22/2019   CREATININE 0.50 (L) 07/22/2019   BUN 12 07/22/2019   CO2 21 07/22/2019   TSH 1.310 07/22/2019   HGBA1C 5.9 (H) 07/22/2019     EKG: N/A   CV: N/A   Past Medical History:  Diagnosis Date  . Allergy    goat meat, pollen  . Gestational diabetes   . Pre-diabetes   . Urticaria     Past Surgical History:  Procedure Laterality Date  . WISDOM TOOTH EXTRACTION      MEDICATIONS: . BENZOCAINE, DENTAL, 20 % LIQD  .  cetirizine (ZYRTEC) 10 MG tablet  . EPINEPHrine (EPIPEN 2-PAK) 0.3 mg/0.3 mL IJ SOAJ injection  . fluticasone (FLONASE) 50 MCG/ACT nasal spray  . ibuprofen (ADVIL) 600 MG tablet  . levonorgestrel (MIRENA) 20 MCG/24HR IUD  . Multiple Vitamin (MULTIVITAMIN WITH MINERALS) TABS tablet  . omeprazole (PRILOSEC) 40 MG capsule  . Prenatal Multivit-Min-Fe-FA (PRENATAL VITAMINS) 0.8 MG tablet   No current facility-administered medications for this encounter.      Myra Gianotti, PA-C Surgical Short Stay/Anesthesiology [facility] Phone [phone] [facility] Phone [phone] 07/26/2019 10:26 AM

## 2019-07-26 NOTE — Anesthesia Preprocedure Evaluation (Addendum)
Anesthesia Evaluation  Patient identified by MRN, date of birth, ID band Patient awake    Reviewed: Allergy & Precautions, NPO status , Patient's Chart, lab work & pertinent test results  Airway Mallampati: I  TM Distance: >3 FB Neck ROM: Full    Dental no notable dental hx. (+) Teeth Intact, Dental Advisory Given   Pulmonary neg pulmonary ROS,    Pulmonary exam normal breath sounds clear to auscultation       Cardiovascular negative cardio ROS Normal cardiovascular exam Rhythm:Regular Rate:Normal     Neuro/Psych negative neurological ROS  negative psych ROS   GI/Hepatic Neg liver ROS, GERD  Medicated,  Endo/Other  negative endocrine ROSdiabetes, Well Controlled  Renal/GU negative Renal ROS  negative genitourinary   Musculoskeletal negative musculoskeletal ROS (+)   Abdominal   Peds  Hematology negative hematology ROS (+)   Anesthesia Other Findings   Reproductive/Obstetrics                           Anesthesia Physical Anesthesia Plan  ASA: II  Anesthesia Plan: General   Post-op Pain Management:    Induction: Intravenous  PONV Risk Score and Plan: Ondansetron, Dexamethasone, Midazolam and Scopolamine patch - Pre-op  Airway Management Planned: LMA  Additional Equipment:   Intra-op Plan:   Post-operative Plan: Extubation in OR  Informed Consent: I have reviewed the patients History and Physical, chart, labs and discussed the procedure including the risks, benefits and alternatives for the proposed anesthesia with the patient or authorized representative who has indicated his/her understanding and acceptance.     Dental advisory given  Plan Discussed with: CRNA  Anesthesia Plan Comments: ( )      Anesthesia Quick Evaluation

## 2019-07-27 ENCOUNTER — Ambulatory Visit
Admission: RE | Admit: 2019-07-27 | Discharge: 2019-07-27 | Disposition: A | Discharge status: Home / Self Care | Payer: Medicaid Other | Source: Ambulatory Visit | Attending: General Surgery | Admitting: General Surgery

## 2019-07-27 ENCOUNTER — Other Ambulatory Visit: Payer: Self-pay

## 2019-07-27 DIAGNOSIS — R928 Other abnormal and inconclusive findings on diagnostic imaging of breast: Secondary | ICD-10-CM | POA: Diagnosis not present

## 2019-07-27 DIAGNOSIS — D242 Benign neoplasm of left breast: Secondary | ICD-10-CM

## 2019-07-28 ENCOUNTER — Encounter (HOSPITAL_COMMUNITY): Payer: Self-pay

## 2019-07-28 ENCOUNTER — Ambulatory Visit (HOSPITAL_COMMUNITY): Payer: Medicaid Other | Admitting: Vascular Surgery

## 2019-07-28 ENCOUNTER — Encounter (HOSPITAL_COMMUNITY)
Admission: RE | Disposition: A | Discharge status: Home / Self Care | Payer: Self-pay | Source: Ambulatory Visit | Attending: General Surgery

## 2019-07-28 ENCOUNTER — Other Ambulatory Visit: Payer: Self-pay

## 2019-07-28 ENCOUNTER — Ambulatory Visit (HOSPITAL_COMMUNITY): Payer: Medicaid Other | Admitting: Anesthesiology

## 2019-07-28 ENCOUNTER — Ambulatory Visit (HOSPITAL_COMMUNITY)
Admission: RE | Admit: 2019-07-28 | Discharge: 2019-07-28 | Disposition: A | Discharge status: Home / Self Care | Payer: Medicaid Other | Source: Ambulatory Visit | Attending: General Surgery | Admitting: General Surgery

## 2019-07-28 ENCOUNTER — Ambulatory Visit
Admission: RE | Admit: 2019-07-28 | Discharge: 2019-07-28 | Disposition: A | Discharge status: Home / Self Care | Payer: Medicaid Other | Source: Ambulatory Visit | Attending: General Surgery | Admitting: General Surgery

## 2019-07-28 DIAGNOSIS — D242 Benign neoplasm of left breast: Secondary | ICD-10-CM | POA: Diagnosis not present

## 2019-07-28 DIAGNOSIS — Z5181 Encounter for therapeutic drug level monitoring: Secondary | ICD-10-CM | POA: Diagnosis not present

## 2019-07-28 DIAGNOSIS — E119 Type 2 diabetes mellitus without complications: Secondary | ICD-10-CM | POA: Diagnosis not present

## 2019-07-28 DIAGNOSIS — R928 Other abnormal and inconclusive findings on diagnostic imaging of breast: Secondary | ICD-10-CM | POA: Diagnosis not present

## 2019-07-28 DIAGNOSIS — K219 Gastro-esophageal reflux disease without esophagitis: Secondary | ICD-10-CM | POA: Insufficient documentation

## 2019-07-28 DIAGNOSIS — N6012 Diffuse cystic mastopathy of left breast: Secondary | ICD-10-CM | POA: Diagnosis not present

## 2019-07-28 HISTORY — PX: BREAST LUMPECTOMY WITH RADIOACTIVE SEED LOCALIZATION: SHX6424

## 2019-07-28 LAB — POCT PREGNANCY, URINE: Preg Test, Ur: NEGATIVE

## 2019-07-28 SURGERY — BREAST LUMPECTOMY WITH RADIOACTIVE SEED LOCALIZATION
Anesthesia: General | Site: Breast | Laterality: Left

## 2019-07-28 MED ORDER — ONDANSETRON HCL 4 MG/2ML IJ SOLN
INTRAMUSCULAR | Status: AC
  Filled 2019-07-28: qty 2

## 2019-07-28 MED ORDER — DEXAMETHASONE SODIUM PHOSPHATE 10 MG/ML IJ SOLN
INTRAMUSCULAR | Status: DC | PRN
  Administered 2019-07-28: 5 mg via INTRAVENOUS

## 2019-07-28 MED ORDER — MIDAZOLAM HCL 2 MG/2ML IJ SOLN
INTRAMUSCULAR | Status: AC
  Filled 2019-07-28: qty 2

## 2019-07-28 MED ORDER — PROPOFOL 10 MG/ML IV BOLUS
INTRAVENOUS | Status: AC
  Filled 2019-07-28: qty 40

## 2019-07-28 MED ORDER — FENTANYL CITRATE (PF) 100 MCG/2ML IJ SOLN: 25.0000 ug | INTRAMUSCULAR | Status: DC | PRN

## 2019-07-28 MED ORDER — CELECOXIB 200 MG PO CAPS
200.0000 mg | ORAL_CAPSULE | ORAL | Status: AC
  Administered 2019-07-28: 200 mg via ORAL
  Filled 2019-07-28: qty 1

## 2019-07-28 MED ORDER — DIPHENHYDRAMINE HCL 50 MG/ML IJ SOLN
INTRAMUSCULAR | Status: AC
  Filled 2019-07-28: qty 1

## 2019-07-28 MED ORDER — LIDOCAINE 2% (20 MG/ML) 5 ML SYRINGE
INTRAMUSCULAR | Status: AC
  Filled 2019-07-28: qty 5

## 2019-07-28 MED ORDER — PROPOFOL 10 MG/ML IV BOLUS
INTRAVENOUS | Status: DC | PRN
  Administered 2019-07-28: 200 mg via INTRAVENOUS

## 2019-07-28 MED ORDER — ACETAMINOPHEN 500 MG PO TABS
1000.0000 mg | ORAL_TABLET | ORAL | Status: AC
  Administered 2019-07-28: 1000 mg via ORAL
  Filled 2019-07-28: qty 2

## 2019-07-28 MED ORDER — BUPIVACAINE-EPINEPHRINE 0.25% -1:200000 IJ SOLN
INTRAMUSCULAR | Status: DC | PRN
  Administered 2019-07-28: 10 mL

## 2019-07-28 MED ORDER — CEFAZOLIN SODIUM-DEXTROSE 2-4 GM/100ML-% IV SOLN
2.0000 g | INTRAVENOUS | Status: AC
  Administered 2019-07-28: 2 g via INTRAVENOUS
  Filled 2019-07-28: qty 100

## 2019-07-28 MED ORDER — GABAPENTIN 300 MG PO CAPS
300.0000 mg | ORAL_CAPSULE | ORAL | Status: AC
  Administered 2019-07-28: 300 mg via ORAL
  Filled 2019-07-28: qty 1

## 2019-07-28 MED ORDER — CHLORHEXIDINE GLUCONATE CLOTH 2 % EX PADS: 6.0000 | MEDICATED_PAD | Freq: Once | CUTANEOUS | Status: DC

## 2019-07-28 MED ORDER — LACTATED RINGERS IV SOLN
INTRAVENOUS | Status: DC | PRN
  Administered 2019-07-28: 08:00:00 via INTRAVENOUS

## 2019-07-28 MED ORDER — ONDANSETRON HCL 4 MG/2ML IJ SOLN
4.0000 mg | Freq: Once | INTRAMUSCULAR | Status: AC
  Administered 2019-07-28: 4 mg via INTRAVENOUS

## 2019-07-28 MED ORDER — ONDANSETRON HCL 4 MG/2ML IJ SOLN
INTRAMUSCULAR | Status: DC | PRN
  Administered 2019-07-28: 4 mg via INTRAVENOUS

## 2019-07-28 MED ORDER — FENTANYL CITRATE (PF) 250 MCG/5ML IJ SOLN
INTRAMUSCULAR | Status: DC | PRN
  Administered 2019-07-28 (×2): 50 ug via INTRAVENOUS

## 2019-07-28 MED ORDER — SCOPOLAMINE 1 MG/3DAYS TD PT72
MEDICATED_PATCH | TRANSDERMAL | Status: AC
  Filled 2019-07-28: qty 1

## 2019-07-28 MED ORDER — DIPHENHYDRAMINE HCL 50 MG/ML IJ SOLN
INTRAMUSCULAR | Status: DC | PRN
  Administered 2019-07-28: 12.5 mg via INTRAVENOUS

## 2019-07-28 MED ORDER — MIDAZOLAM HCL 2 MG/2ML IJ SOLN
INTRAMUSCULAR | Status: DC | PRN
  Administered 2019-07-28: 2 mg via INTRAVENOUS

## 2019-07-28 MED ORDER — HYDROCODONE-ACETAMINOPHEN 5-325 MG PO TABS: 1.0000 | ORAL_TABLET | Freq: Four times a day (QID) | ORAL | 0 refills | Status: DC | PRN

## 2019-07-28 MED ORDER — DEXAMETHASONE SODIUM PHOSPHATE 10 MG/ML IJ SOLN
INTRAMUSCULAR | Status: AC
  Filled 2019-07-28: qty 1

## 2019-07-28 MED ORDER — FENTANYL CITRATE (PF) 250 MCG/5ML IJ SOLN
INTRAMUSCULAR | Status: AC
  Filled 2019-07-28: qty 5

## 2019-07-28 MED ORDER — LIDOCAINE HCL (CARDIAC) PF 100 MG/5ML IV SOSY
PREFILLED_SYRINGE | INTRAVENOUS | Status: DC | PRN
  Administered 2019-07-28: 60 mg via INTRATRACHEAL

## 2019-07-28 SURGICAL SUPPLY — 33 items
APPLIER CLIP 9.375 MED OPEN (MISCELLANEOUS) ×3
BINDER BREAST LRG (GAUZE/BANDAGES/DRESSINGS) IMPLANT
BINDER BREAST XLRG (GAUZE/BANDAGES/DRESSINGS) IMPLANT
CANISTER SUCT 3000ML PPV (MISCELLANEOUS) ×3 IMPLANT
CHLORAPREP W/TINT 26 (MISCELLANEOUS) ×3 IMPLANT
CLIP APPLIE 9.375 MED OPEN (MISCELLANEOUS) ×1 IMPLANT
COVER PROBE W GEL 5X96 (DRAPES) ×3 IMPLANT
COVER SURGICAL LIGHT HANDLE (MISCELLANEOUS) ×3 IMPLANT
COVER WAND RF STERILE (DRAPES) IMPLANT
DERMABOND ADVANCED (GAUZE/BANDAGES/DRESSINGS) ×2
DERMABOND ADVANCED .7 DNX12 (GAUZE/BANDAGES/DRESSINGS) ×1 IMPLANT
DEVICE DUBIN SPECIMEN MAMMOGRA (MISCELLANEOUS) ×3 IMPLANT
DRAPE CHEST BREAST 15X10 FENES (DRAPES) ×3 IMPLANT
ELECT COATED BLADE 2.86 ST (ELECTRODE) ×3 IMPLANT
ELECT REM PT RETURN 9FT ADLT (ELECTROSURGICAL) ×3
ELECTRODE REM PT RTRN 9FT ADLT (ELECTROSURGICAL) ×1 IMPLANT
GLOVE BIO SURGEON STRL SZ7.5 (GLOVE) ×6 IMPLANT
GLOVE INDICATOR 7.0 STRL GRN (GLOVE) ×3 IMPLANT
GLOVE SURG SS PI 6.5 STRL IVOR (GLOVE) ×3 IMPLANT
GOWN STRL REUS W/ TWL LRG LVL3 (GOWN DISPOSABLE) ×2 IMPLANT
GOWN STRL REUS W/TWL LRG LVL3 (GOWN DISPOSABLE) ×4
KIT BASIN OR (CUSTOM PROCEDURE TRAY) ×3 IMPLANT
KIT MARKER MARGIN INK (KITS) ×3 IMPLANT
LIGHT WAVEGUIDE WIDE FLAT (MISCELLANEOUS) IMPLANT
NEEDLE HYPO 25GX1X1/2 BEV (NEEDLE) ×3 IMPLANT
NS IRRIG 1000ML POUR BTL (IV SOLUTION) ×3 IMPLANT
PACK GENERAL/GYN (CUSTOM PROCEDURE TRAY) ×3 IMPLANT
SUT MNCRL AB 4-0 PS2 18 (SUTURE) ×3 IMPLANT
SUT SILK 2 0 SH (SUTURE) IMPLANT
SUT VIC AB 3-0 SH 18 (SUTURE) ×3 IMPLANT
SYR CONTROL 10ML LL (SYRINGE) ×3 IMPLANT
TOWEL GREEN STERILE (TOWEL DISPOSABLE) ×3 IMPLANT
TOWEL GREEN STERILE FF (TOWEL DISPOSABLE) ×3 IMPLANT

## 2019-07-28 NOTE — Op Note (Signed)
07/28/2019  9:11 AM  PATIENT:  Cindy Howell  33 y.o. female  PRE-OPERATIVE DIAGNOSIS:  LEFT BREAST PAPILLOMA  POST-OPERATIVE DIAGNOSIS:  LEFT BREAST PAPILLOMA  PROCEDURE:  Procedure(s): LEFT BREAST LUMPECTOMY WITH RADIOACTIVE SEED LOCALIZATION (Left)  SURGEON:  Surgeon(s) and Role:    * Jovita Kussmaul, MD - Primary  PHYSICIAN ASSISTANT:   ASSISTANTS: none   ANESTHESIA:   local and general  EBL:  minimal   BLOOD ADMINISTERED:none  DRAINS: none   LOCAL MEDICATIONS USED:  MARCAINE     SPECIMEN:  Source of Specimen:  left breast tissue  DISPOSITION OF SPECIMEN:  PATHOLOGY  COUNTS:  YES  TOURNIQUET:  * No tourniquets in log *  DICTATION: .Dragon Dictation   After informed consent was obtained the patient was brought to the operating room and placed in the supine position on the operating table.  After adequate induction of general anesthesia the patient's left breast was prepped with ChloraPrep, allowed to dry, and draped in usual sterile manner.  An appropriate timeout was performed.  Previously an I-125 seed was placed in the subareolar lateral left breast to mark an area of intraductal papilloma.  The neoprobe was set to I-125 in the area of radioactivity was readily identified.  The area around this was infiltrated with quarter percent Marcaine.  A curvilinear incision was made along the outer edge of the areola of the left breast with a 15 blade knife.  The incision was carried through the skin and subcutaneous tissue sharply with the electrocautery.  The subareolar breast tissue was then separated from the skin and subcutaneous tissue in the area of the radioactive seed.  Once this was accomplished then a circular portion of breast tissue was excised sharply around the radioactive seed while checking the area of radioactivity frequently.  Once the specimen was removed it was oriented with the appropriate paint colors.  A specimen radiograph was obtained that showed the clip  and seed to be within the specimen.  The specimen was then sent to pathology for further evaluation.  Hemostasis was achieved using the Bovie electrocautery.  The wound was then irrigated with saline and infiltrated with more quarter percent Marcaine.  The deep layer of the wound was then closed with layers of interrupted 3-0 Vicryl stitches.  The skin was then closed with interrupted 4-0 Monocryl subcuticular stitches.  Dermabond dressings were applied.  The patient tolerated the procedure well.  At the end of the case all needle sponge and instrument counts were correct.  The patient was then awakened and taken to recovery in stable condition.  PLAN OF CARE: Discharge to home after PACU  PATIENT DISPOSITION:  PACU - hemodynamically stable.   Delay start of Pharmacological VTE agent (>24hrs) due to surgical blood loss or risk of bleeding: not applicable

## 2019-07-28 NOTE — Transfer of Care (Signed)
Immediate Anesthesia Transfer of Care Note  Patient: Cindy Howell  Procedure(s) Performed: LEFT BREAST LUMPECTOMY WITH RADIOACTIVE SEED LOCALIZATION (Left Breast)  Patient Location: PACU  Anesthesia Type:General  Level of Consciousness: drowsy and patient cooperative  Airway & Oxygen Therapy: Patient Spontanous Breathing and Patient connected to face mask oxygen  Post-op Assessment: Report given to RN and Post -op Vital signs reviewed and stable  Post vital signs: Reviewed and stable  Last Vitals:  Vitals Value Taken Time  BP 92/50 07/28/19 0921  Temp    Pulse 76 07/28/19 0922  Resp 14 07/28/19 0922  SpO2 100 % 07/28/19 0922  Vitals shown include unvalidated device data.  Last Pain:  Vitals:   07/28/19 0722  TempSrc: Oral  PainSc:          Complications: No apparent anesthesia complications

## 2019-07-28 NOTE — Anesthesia Postprocedure Evaluation (Signed)
Anesthesia Post Note  Patient: Pharmacologist  Procedure(s) Performed: LEFT BREAST LUMPECTOMY WITH RADIOACTIVE SEED LOCALIZATION (Left Breast)     Patient location during evaluation: PACU Anesthesia Type: General Level of consciousness: awake and alert Pain management: pain level controlled Vital Signs Assessment: post-procedure vital signs reviewed and stable Respiratory status: spontaneous breathing, nonlabored ventilation, respiratory function stable and patient connected to nasal cannula oxygen Cardiovascular status: blood pressure returned to baseline and stable Postop Assessment: no apparent nausea or vomiting Anesthetic complications: no    Last Vitals:  Vitals:   07/28/19 0955 07/28/19 1008  BP:  117/79  Pulse: 75 80  Resp: 20 12  Temp:    SpO2: 98% 97%    Last Pain:  Vitals:   07/28/19 1008  TempSrc:   PainSc: 0-No pain                 Ariyan Brisendine L Merrie Epler

## 2019-07-28 NOTE — H&P (Signed)
Cindy Howell  Location: [facility] Patient #: [id number] DOB: 02-06-1986 Married / Language: Undefined / Race: Undefined Female   History of Present Illness  The patient is a 33 year old female who presents with a breast mass. We are asked to see the patient in consultation by Dr. Talbert Cage [place] to evaluate her for a left breast papilloma. The patient is a 33 year old female who recently felt a mass in the central part of the left breast. She was evaluated with mammogram and ultrasound initially and nothing much was seen. She was then brought back for a 3 month follow-up and at that point a 1.5 cm mass in one of the milk ducts was seen. This was biopsied and came back as an intraductal papilloma. She denies any breast pain. She denies any significant discharge from the nipple. She said no other personal or family history of breast cancer. She does not smoke.   Past Surgical History Breast Biopsy  Left.  Diagnostic Studies History  Colonoscopy  never Mammogram  within last year Pap Smear  1-5 years ago  Allergies  SHELLFISH  Allergies Reconciled   Medication History No Current Medications Medications Reconciled  Social History Caffeine use  Tea. No alcohol use  No drug use  Tobacco use  Never smoker.  Family History  Diabetes Mellitus  Father, Mother. Heart Disease  Father. Hypertension  Father. Kidney Disease  Brother.  Pregnancy / Birth History  Contraceptive History  Intrauterine device. Gravida  5 Maternal age  12-20 Para  3 Regular periods   Other Problems  Back Pain  Gastroesophageal Reflux Disease  Lump In Breast     Review of Systems General Present- Weight Gain. Not Present- Appetite Loss, Chills, Fatigue, Fever, Night Sweats and Weight Loss. HEENT Present- Seasonal Allergies and Wears glasses/contact lenses. Not Present- Earache, Hearing Loss, Hoarseness, Nose Bleed, Oral Ulcers, Ringing in the Ears,  Sinus Pain, Sore Throat, Visual Disturbances and Yellow Eyes. Breast Present- Breast Mass and Nipple Discharge. Not Present- Breast Pain and Skin Changes. Gastrointestinal Present- Excessive gas. Not Present- Abdominal Pain, Bloating, Bloody Stool, Change in Bowel Habits, Chronic diarrhea, Constipation, Difficulty Swallowing, Gets full quickly at meals, Hemorrhoids, Indigestion, Nausea, Rectal Pain and Vomiting. Female Genitourinary Not Present- Frequency, Nocturia, Painful Urination, Pelvic Pain and Urgency. Neurological Present- Headaches. Not Present- Decreased Memory, Fainting, Numbness, Seizures, Tingling, Tremor, Trouble walking and Weakness. Psychiatric Not Present- Anxiety, Bipolar, Change in Sleep Pattern, Depression, Fearful and Frequent crying. Endocrine Not Present- Cold Intolerance, Excessive Hunger, Hair Changes, Heat Intolerance, Hot flashes and New Diabetes. Hematology Not Present- Blood Thinners, Easy Bruising, Excessive bleeding, Gland problems, HIV and Persistent Infections.  Vitals  Weight: 189.8 lb Height: 63in Body Surface Area: 1.89 m Body Mass Index: 33.62 kg/m  Temp.: 98.106F  Pulse: 96 (Regular)  BP: 126/74 (Sitting, Left Arm, Standard)    Physical Exam General Mental Status-Alert. General Appearance-Consistent with stated age. Hydration-Well hydrated. Voice-Normal.  Head and Neck Head-normocephalic, atraumatic with no lesions or palpable masses. Trachea-midline. Thyroid Gland Characteristics - normal size and consistency.  Eye Eyeball - Bilateral-Extraocular movements intact. Sclera/Conjunctiva - Bilateral-No scleral icterus.  Chest and Lung Exam Chest and lung exam reveals -quiet, even and easy respiratory effort with no use of accessory muscles and on auscultation, normal breath sounds, no adventitious sounds and normal vocal resonance. Inspection Chest Wall - Normal. Back - normal.  Breast Note: There is no palpable  mass in either breast. There is no palpable axillary, supraclavicular, or cervical lymphadenopathy.  Cardiovascular Cardiovascular examination reveals -normal heart sounds, regular rate and rhythm with no murmurs and normal pedal pulses bilaterally.  Abdomen Inspection Inspection of the abdomen reveals - No Hernias. Skin - Scar - no surgical scars. Palpation/Percussion Palpation and Percussion of the abdomen reveal - Soft, Non Tender, No Rebound tenderness, No Rigidity (guarding) and No hepatosplenomegaly. Auscultation Auscultation of the abdomen reveals - Bowel sounds normal.  Neurologic Neurologic evaluation reveals -alert and oriented x 3 with no impairment of recent or remote memory. Mental Status-Normal.  Musculoskeletal Normal Exam - Left-Upper Extremity Strength Normal and Lower Extremity Strength Normal. Normal Exam - Right-Upper Extremity Strength Normal and Lower Extremity Strength Normal.  Lymphatic Head & Neck  General Head & Neck Lymphatics: Bilateral - Description - Normal. Axillary  General Axillary Region: Bilateral - Description - Normal. Tenderness - Non Tender. Femoral & Inguinal  Generalized Femoral & Inguinal Lymphatics: Bilateral - Description - Normal. Tenderness - Non Tender.    Assessment & Plan  PAPILLOMA OF LEFT BREAST (D24.2) Impression: The patient appears to have a 1.5 cm intraductal papilloma in the left breast. Because there is a bout of 5-10% chance of missing something more significant and because the sizes over a centimeter I think it would be reasonable to remove this area. She would also like to have this done. I have discussed with her in detail the risks and benefits of the operation as well as some of the technical aspects including the use of a radioactive seed for localization and she understands and wishes to proceed

## 2019-07-28 NOTE — Anesthesia Procedure Notes (Signed)
Procedure Name: LMA Insertion Date/Time: 07/28/2019 8:42 AM Performed by: Kathryne Hitch, CRNA Pre-anesthesia Checklist: Patient identified, Emergency Drugs available, Suction available and Patient being monitored Patient Re-evaluated:Patient Re-evaluated prior to induction Oxygen Delivery Method: Circle system utilized Preoxygenation: Pre-oxygenation with 100% oxygen Induction Type: IV induction Ventilation: Mask ventilation without difficulty LMA: LMA inserted LMA Size: 4.0 Number of attempts: 1 Placement Confirmation: breath sounds checked- equal and bilateral Tube secured with: Tape Dental Injury: Teeth and Oropharynx as per pre-operative assessment

## 2019-07-28 NOTE — Interval H&P Note (Signed)
History and Physical Interval Note:  07/28/2019 8:15 AM  Cindy Howell  has presented today for surgery, with the diagnosis of LEFT BREAST PAPILLOMA.  The various methods of treatment have been discussed with the patient and family. After consideration of risks, benefits and other options for treatment, the patient has consented to  Procedure(s): LEFT BREAST LUMPECTOMY WITH RADIOACTIVE SEED LOCALIZATION (Left) as a surgical intervention.  The patient's history has been reviewed, patient examined, no change in status, stable for surgery.  I have reviewed the patient's chart and labs.  Questions were answered to the patient's satisfaction.     Autumn Messing III

## 2019-07-29 ENCOUNTER — Encounter (HOSPITAL_COMMUNITY): Payer: Self-pay | Admitting: General Surgery

## 2019-07-29 LAB — SURGICAL PATHOLOGY

## 2019-08-09 DIAGNOSIS — Z5181 Encounter for therapeutic drug level monitoring: Secondary | ICD-10-CM | POA: Diagnosis not present

## 2019-08-19 DIAGNOSIS — Z5181 Encounter for therapeutic drug level monitoring: Secondary | ICD-10-CM | POA: Diagnosis not present

## 2019-08-24 ENCOUNTER — Telehealth (INDEPENDENT_AMBULATORY_CARE_PROVIDER_SITE_OTHER): Payer: Medicaid Other | Admitting: Family Medicine

## 2019-08-24 ENCOUNTER — Other Ambulatory Visit: Payer: Self-pay

## 2019-08-24 DIAGNOSIS — K219 Gastro-esophageal reflux disease without esophagitis: Secondary | ICD-10-CM

## 2019-08-24 DIAGNOSIS — B372 Candidiasis of skin and nail: Secondary | ICD-10-CM | POA: Insufficient documentation

## 2019-08-24 DIAGNOSIS — L700 Acne vulgaris: Secondary | ICD-10-CM

## 2019-08-24 MED ORDER — OMEPRAZOLE 40 MG PO CPDR: 40.0000 mg | DELAYED_RELEASE_CAPSULE | Freq: Every day | ORAL | 0 refills | Status: DC

## 2019-08-24 MED ORDER — NYSTATIN 100000 UNIT/GM EX POWD: Freq: Three times a day (TID) | CUTANEOUS | 0 refills | Status: DC

## 2019-08-24 NOTE — Assessment & Plan Note (Signed)
Unable to get her on video and due to location of rash video use to see rash is not appropriate. Given history and symptoms it sounds like a yeast infection of the skin under the breasts due to increased moisture and sweat. - Nystatin poweder QID for 1-2 weeks - Follow up with [place] if no improvement

## 2019-08-24 NOTE — Progress Notes (Signed)
[facility] Telemedicine Visit  Patient consented to have virtual visit. Method of visit: Video was attempted, but technology challenges prevented patient from using video, so visit was conducted via telephone.  Encounter participants: Patient: Cindy Howell - located at home in [place] Provider: Nuala Alpha - located at [facility] Others (if applicable): none  Chief Complaint: Red itchy rash underneath both breasts  HPI:  Patient states she has had to wear only a sports bra after having a breast biopsy performed and then surgery to remove a suspicious lesion from her breast. She has been doing so but has been sweating more. Shortly after this she began noticing a red itchy rash under her breasts and slightly in between her breasts. She is not having pain, only itching, only under the breasts nowhere else. No fever, chills, no warmth of the skin to touch.  ROS: per HPI  Pertinent PMHx: allergic rhinitis, TMJ, Hx of preeclampsia  Exam:  Respiratory: speaks in full sentences easily  Assessment/Plan:  Mucocutaneous candidiasis Unable to get her on video and due to location of rash video use to see rash is not appropriate. Given history and symptoms it sounds like a yeast infection of the skin under the breasts due to increased moisture and sweat. - Nystatin poweder QID for 1-2 weeks - Follow up with [place] if no improvement  Acne Vulgaris Patient requesting referral to dermatology for acne management   Time spent during visit with patient: >15 minutes  Harolyn Rutherford, DO [place], PGY-3

## 2019-08-26 DIAGNOSIS — Z5181 Encounter for therapeutic drug level monitoring: Secondary | ICD-10-CM | POA: Diagnosis not present

## 2019-08-30 DIAGNOSIS — Z7689 Persons encountering health services in other specified circumstances: Secondary | ICD-10-CM | POA: Diagnosis not present

## 2019-08-31 DIAGNOSIS — Z5181 Encounter for therapeutic drug level monitoring: Secondary | ICD-10-CM | POA: Diagnosis not present

## 2019-09-07 DIAGNOSIS — H04123 Dry eye syndrome of bilateral lacrimal glands: Secondary | ICD-10-CM | POA: Diagnosis not present

## 2019-09-16 DIAGNOSIS — L700 Acne vulgaris: Secondary | ICD-10-CM | POA: Diagnosis not present

## 2019-09-16 DIAGNOSIS — Z7689 Persons encountering health services in other specified circumstances: Secondary | ICD-10-CM | POA: Diagnosis not present

## 2019-09-16 DIAGNOSIS — L298 Other pruritus: Secondary | ICD-10-CM | POA: Diagnosis not present

## 2019-09-20 ENCOUNTER — Other Ambulatory Visit: Payer: Self-pay

## 2019-09-20 ENCOUNTER — Telehealth (INDEPENDENT_AMBULATORY_CARE_PROVIDER_SITE_OTHER): Payer: Medicaid Other | Admitting: Family Medicine

## 2019-09-20 DIAGNOSIS — Z20822 Contact with and (suspected) exposure to covid-19: Secondary | ICD-10-CM

## 2019-09-20 MED ORDER — BENZONATATE 100 MG PO CAPS: 100.0000 mg | ORAL_CAPSULE | Freq: Two times a day (BID) | ORAL | 0 refills | Status: DC | PRN

## 2019-09-20 NOTE — Patient Instructions (Signed)
We recommend you undergo testing for COVID19. This is by appointment only. There are three locations and hours vary.   Text "COVID" to [phone] OR log onto [email or web address].  You can also call [phone].   There are three locations for testing, hours vary.   [facility] ([facility] in [place]) [address] [place], [place] [address] Hours 8 AM until 3:30 PM  or [facility]) [place], [place] [address] or [address] (near [address] in [place]) [place], [place] [address]   You should quarantine (isolate at home) until the following criteria are met: At least 10 days since symptoms first appeared and At least 24 hours with no fever without fever-reducing medication and Other symptoms of COVID-19 are improving (Loss of taste and smell may persist for weeks or months after recovery and need not delay the end of isolation)  When to seek emergency medical attention Look for emergency warning signs* for COVID-19. If your or someone you know is showing any of these signs, seek emergency medical care immediately:  Trouble breathing Persistent pain or pressure in the chest New confusion Inability to wake or stay awake Bluish lips or face Inability to tolerate fluids  *This list is not all possible symptoms. We discussed additional symptoms   Call 911 or call ahead to your local emergency facility: Notify the operator that you are seeking care for someone who has or may have COVID-19.

## 2019-09-20 NOTE — Progress Notes (Signed)
[name] Telemedicine Visit  Patient consented to have virtual visit. Method of visit: Video  Encounter participants: Patient: Cindy Howell - located at home  Provider: Gifford Shave - located at home   Chief Complaint: Suspected COVID 19   HPI:  Patient reports that her symptoms started on 09/15/2019.  She had cough, congestion, runny nose, body aches, loss of taste and smell, increased mucus production.  She also has had occasional shortness of breath with exertion but no shortness of breath at rest.  She is also complaining of pressure behind her eyes and burning in her nose.  Patient denies any fever, nausea, vomiting, diarrhea, constipation.  Patient has been taking Tessalon Perles for cough relief and they have been effective and is requesting a refill on those.  Patient has been taking nothing else for symptomatic relief.  Of note patient's husband was diagnosed with Covid today 1/4.  She has not been tested nor is anyone else in her family.  She has 2 children 1 of which she was also having mild symptoms.   ROS: per HPI  Pertinent PMHx: Allergic rhinitis  Exam: General: Patient appears well in no acute distress. Respiratory: Speaking in clear sentences, no respiratory distress or shortness of breath appreciated  Assessment/Plan:  Encounter by telehealth for suspected COVID-19 Patient with recent contact who has tested positive for COVID-19 as well as symtpoms concerning for covid-19. Patient reports cough, exertional SOB, and body aches.  Denies fever, chills, nausea, vomiting, diarrhea, constipation.   Would be reasonable to test given symptoms in close contact with Covid positive person. -Counseled on symptomatic relief using OTC cough syrup as well as tea -Counseled on continued use of Flonase which she uses for allergic rhinitis  -Refilled patient's prescription for Ladona Ridgel -Ordered [id number] for testing and instructed patient to call to  schedule COVID test  -Counseled on wearing a mask, washing hands and avoiding social gatherings  -ED precautions discussed and patient expressed good understanding -Patient instructed to avoid others until they meet criteria for ending isolation after any suspected COVID, which are:  -24 hours with no fever (without use of medicaitons) and -respiratory symptoms have improved (e.g. cough, shortness of breath) or  -10 days since symptoms first appeared

## 2019-09-20 NOTE — Assessment & Plan Note (Addendum)
Patient with recent contact who has tested positive for COVID-19 as well as symtpoms concerning for covid-19. Patient reports cough, exertional SOB, and body aches.  Denies fever, chills, nausea, vomiting, diarrhea, constipation.   Would be reasonable to test given symptoms in close contact with Covid positive person. -Counseled on symptomatic relief using OTC cough syrup as well as tea -Counseled on continued use of Flonase which she uses for allergic rhinitis  -Refilled patient's prescription for Ladona Ridgel -Ordered [id number] for testing and instructed patient to call to schedule COVID test  -Counseled on wearing a mask, washing hands and avoiding social gatherings  -ED precautions discussed and patient expressed good understanding -Patient instructed to avoid others until they meet criteria for ending isolation after any suspected COVID, which are:  -24 hours with no fever (without use of medicaitons) and -respiratory symptoms have improved (e.g. cough, shortness of breath) or  -10 days since symptoms first appeared

## 2019-09-21 ENCOUNTER — Ambulatory Visit: Payer: Medicaid Other | Attending: Internal Medicine

## 2019-09-21 DIAGNOSIS — Z20822 Contact with and (suspected) exposure to covid-19: Secondary | ICD-10-CM | POA: Diagnosis not present

## 2019-09-22 ENCOUNTER — Other Ambulatory Visit: Payer: Self-pay

## 2019-09-22 ENCOUNTER — Telehealth (INDEPENDENT_AMBULATORY_CARE_PROVIDER_SITE_OTHER): Payer: Medicaid Other | Admitting: Family Medicine

## 2019-09-22 ENCOUNTER — Encounter: Payer: Self-pay | Admitting: Family Medicine

## 2019-09-22 DIAGNOSIS — R208 Other disturbances of skin sensation: Secondary | ICD-10-CM | POA: Diagnosis not present

## 2019-09-22 NOTE — Assessment & Plan Note (Addendum)
Patient has been experiencing nasal irritation that feels like a burning sensation for four days. Patient was recently tested for covid 19 with nasal swab that is likely contributing to nasal irritation. Other potential etiologies include inflammed turbinates and impending viral/bacterial sinus infection. If the patient turns out to be COVID +, this is likely contributing to symptoms.  -follow up covid results  -encourage humidifier use  -encourage use of steam inhalation  -encouraged PRN use of OTC nasal saline to moisten membranes  -NSAIDS to help with decreasing inflammation

## 2019-09-22 NOTE — Progress Notes (Signed)
[place] Telemedicine Visit  Patient consented to have virtual visit. Method of visit: Video was attempted, but technology challenges prevented patient from using video, so visit was conducted via telephone.  Encounter participants: Patient: Cindy Howell - located at home address  Provider: Stark Klein - located at provider's home  Chief Complaint: "nose is burning"  HPI: Ms. Mano states that she has been experiencing  nasal  Burning sensation in the upper region of her nasal bone. She states that she has been using flonase with no relief. Patient also states that her nose has been burning overnight and is quite bothersome for her. She recently developed a dry cough and sneezing and underwent testing for coronavirus. Results are currently pending. The patient states that the burning sensation is similar to the burning she experienced when being swabbed for her covid test.    ROS: per HPI, denies nose bleeding, denies worsening cough, denies SOB or pain with breathing through her nose, denies fevers, chills, reports some clear rhinorrhea that has improved   Pertinent PMHx: allergic rhinitis  Exam:  Respiratory: speaking in clear, full sentences, no signs of respiratory distress from telephone conversation  Nose: patient does not note any visual abnormalities of her nasal region externally   Assessment/Plan:  Nasal burning Patient has been experiencing nasal irritation that feels like a burning sensation for four days. Patient was recently tested for covid 19 with nasal swab that is likely contributing to nasal irritation. Other potential etiologies include inflammed turbinates and impending viral/bacterial sinus infection. If the patient turns out to be COVID +, this is likely contributing to symptoms.  -follow up covid results  -encourage humidifier use  -encourage use of steam inhalation  -encouraged PRN use of OTC nasal saline to moisten membranes   -NSAIDS to help with decreasing inflammation     Time spent during visit with patient:  15 minutes

## 2019-09-23 LAB — NOVEL CORONAVIRUS, NAA: SARS-CoV-2, NAA: DETECTED — AB

## 2019-10-19 ENCOUNTER — Ambulatory Visit (INDEPENDENT_AMBULATORY_CARE_PROVIDER_SITE_OTHER): Payer: Medicaid Other | Admitting: Family Medicine

## 2019-10-19 ENCOUNTER — Other Ambulatory Visit: Payer: Self-pay

## 2019-10-19 VITALS — BP 118/80 | HR 79 | Wt 190.2 lb

## 2019-10-19 DIAGNOSIS — Z23 Encounter for immunization: Secondary | ICD-10-CM

## 2019-10-19 DIAGNOSIS — J302 Other seasonal allergic rhinitis: Secondary | ICD-10-CM | POA: Diagnosis not present

## 2019-10-19 DIAGNOSIS — N644 Mastodynia: Secondary | ICD-10-CM | POA: Diagnosis not present

## 2019-10-19 DIAGNOSIS — Z7689 Persons encountering health services in other specified circumstances: Secondary | ICD-10-CM | POA: Diagnosis not present

## 2019-10-19 MED ORDER — DULOXETINE HCL 20 MG PO CPEP: 20.0000 mg | ORAL_CAPSULE | Freq: Every day | ORAL | 1 refills | Status: DC

## 2019-10-19 MED ORDER — FLUTICASONE PROPIONATE 50 MCG/ACT NA SUSP: 1.0000 | Freq: Every day | NASAL | 2 refills | Status: DC

## 2019-10-19 MED ORDER — BENZONATATE 100 MG PO CAPS: 100.0000 mg | ORAL_CAPSULE | Freq: Two times a day (BID) | ORAL | 0 refills | Status: DC | PRN

## 2019-10-19 NOTE — Patient Instructions (Addendum)
It was great to see you today! Thank you for letting me participate in your care!  Today, we discussed your left breast pain which is most likely due to superficial nerve damage after the incision and biopsy of the mass of your left breast. This is a common occurrence and should get better with time. I have started you on a medication that should help with your symptoms of burning.  For your nose I have started you on a nasal spray, saline rinse every day. Please do this EVERY DAY. I have also refilled your tessalon for your cough. It is common to have this symptoms after you have recovered from [place].  Be well, Harolyn Rutherford, DO PGY-3, [facility]

## 2019-10-19 NOTE — Progress Notes (Signed)
   CHIEF COMPLAINT / HPI: Left Breast Pain, Nasal discomfort  Patient is stating she has continued breast pain of her left breast after a biopsy and found to have an intraductal papilloma. She states since that procedure she has had burning pain in and around the area of her left breast. It is intermittent and bending over or picking up things seem to make it worse. She states it was not present before the biopsy. She denies the pain radiates anywhere, denies skin changes to the breast, or nipple discharge. No fever, chills, no chest pain, shoulder pain, nausea, vomiting, or difficulty breathing. This has been going on for over 3 months.  Continued nasal discomfort after positive COVID-19 infection. She has been seen via virtual visit for this issue before and was given nasal saline rinse, humidifier, and OTC sprays. I do not think the patient has tried these consistently. Reassurance offered and again positioned to patient to try conservative therapy.  PERTINENT  PMH / PSH: Allergic Rhinitis, + COVID 19, Depression, Left breast intraductal papilloma   OBJECTIVE: BP 118/80   Pulse 79   Wt 190 lb 3.2 oz (86.3 kg)   SpO2 100%   BMI 32.65 kg/m   Gen: Alert and Oriented x 3, NAD HEENT: Normocephalic, atraumatic, PERRLA, EOMI, swollen, erythematous turbinates bilaterally, non-erythematous pharyngeal mucosa, no exudates Neck: trachea midline, no thyroidmegaly, no LAD CV: RRR, no murmurs, normal S1, S2 split Resp: CTAB, no wheezing, rales, or rhonchi, comfortable work of breathing Ext: no clubbing, cyanosis, or edemas Skin: warm, dry, intact, no rashes  ASSESSMENT / PLAN:  Breast pain, left No skin changes, no discharge, no fevers, and only occurred after biopsy that discovered a intraductal papilloma. 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 Offered reassurance that the vast majority of the time this will improve on its own. - Duloxetine 20mg  daily given it has  continued for over 3 months - F/u as needed  Nasal Discomfort Most likely after effects from post COVID-19 infection.  - Nasal saline rinse every night    Nuala Alpha, DO,PGY-3 [place]

## 2019-10-22 DIAGNOSIS — N644 Mastodynia: Secondary | ICD-10-CM | POA: Insufficient documentation

## 2019-10-22 NOTE — Assessment & Plan Note (Signed)
No skin changes, no discharge, no fevers, and only occurred after biopsy that discovered a intraductal papilloma. 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 Offered reassurance that the vast majority of the time this will improve on its own. - Duloxetine 20mg  daily given it has continued for over 3 months - F/u as needed

## 2019-10-27 ENCOUNTER — Other Ambulatory Visit: Payer: Self-pay

## 2019-10-27 ENCOUNTER — Telehealth (INDEPENDENT_AMBULATORY_CARE_PROVIDER_SITE_OTHER): Payer: Medicaid Other | Admitting: Family Medicine

## 2019-10-27 DIAGNOSIS — R21 Rash and other nonspecific skin eruption: Secondary | ICD-10-CM

## 2019-10-27 MED ORDER — TRIAMCINOLONE ACETONIDE 0.1 % EX OINT: 1.0000 "application " | TOPICAL_OINTMENT | Freq: Two times a day (BID) | CUTANEOUS | 0 refills | Status: DC

## 2019-10-27 NOTE — Progress Notes (Signed)
[facility] Telemedicine Visit  Patient consented to have virtual visit. Method of visit: Video was attempted, but technology challenges prevented patient from using video, so visit was conducted via telephone.  Encounter participants: Patient: Cindy Howell - located at Home Provider: Caroline More - located at [facility] Others (if applicable): None  Chief Complaint: rash  HPI: Rash Patient reports that she notes itching on her hand. She has 2 babies and has to wash her hands a lot. Reports her hands are very dry as well. Reports no pimples but it is dry and itching. Rash is "brownish". Has not tried anything so far. Is using regular lotion and vaseline but it is not working. No new cosmetic products. No household members with rash.   ROS: per HPI  Pertinent PMHx: allergic rhinitis, GDM, melasma, h/o food allergy  Exam:  Respiratory: speaking full sentences   Assessment/Plan:  Rash Difficult to assess without looking at rash, but technology was limiting factor. Sounds atopic based on description of itching and dryness. Advised to continue use of vaseline and keep area moist. Will give low dose triamcinolone cream. If no improvement f/u in 1-2 weeks in person so we can see the rash. Follow up sooner if worsening.     Time spent during visit with patient: 10 minutes

## 2019-10-28 NOTE — Assessment & Plan Note (Signed)
Difficult to assess without looking at rash, but technology was limiting factor. Sounds atopic based on description of itching and dryness. Advised to continue use of vaseline and keep area moist. Will give low dose triamcinolone cream. If no improvement f/u in 1-2 weeks in person so we can see the rash. Follow up sooner if worsening.

## 2019-11-15 DIAGNOSIS — Z3040 Encounter for surveillance of contraceptives, unspecified: Secondary | ICD-10-CM | POA: Diagnosis not present

## 2019-11-15 DIAGNOSIS — L299 Pruritus, unspecified: Secondary | ICD-10-CM | POA: Diagnosis not present

## 2019-11-15 DIAGNOSIS — Z0000 Encounter for general adult medical examination without abnormal findings: Secondary | ICD-10-CM | POA: Diagnosis not present

## 2019-11-15 DIAGNOSIS — Z113 Encounter for screening for infections with a predominantly sexual mode of transmission: Secondary | ICD-10-CM | POA: Diagnosis not present

## 2019-11-15 DIAGNOSIS — Z6832 Body mass index (BMI) 32.0-32.9, adult: Secondary | ICD-10-CM | POA: Diagnosis not present

## 2019-11-15 DIAGNOSIS — L304 Erythema intertrigo: Secondary | ICD-10-CM | POA: Diagnosis not present

## 2019-11-30 ENCOUNTER — Other Ambulatory Visit: Payer: Self-pay

## 2019-11-30 ENCOUNTER — Telehealth: Payer: Medicaid Other | Admitting: Family Medicine

## 2019-11-30 ENCOUNTER — Telehealth (INDEPENDENT_AMBULATORY_CARE_PROVIDER_SITE_OTHER): Payer: Medicaid Other | Admitting: Family Medicine

## 2019-11-30 DIAGNOSIS — L259 Unspecified contact dermatitis, unspecified cause: Secondary | ICD-10-CM | POA: Diagnosis not present

## 2019-11-30 MED ORDER — EUCERIN EX LOTN: TOPICAL_LOTION | CUTANEOUS | 0 refills | Status: DC | PRN

## 2019-11-30 MED ORDER — HYDROCORTISONE 1 % EX OINT: 1.0000 "application " | TOPICAL_OINTMENT | Freq: Two times a day (BID) | CUTANEOUS | 0 refills | Status: DC

## 2019-11-30 MED ORDER — LEVOCETIRIZINE DIHYDROCHLORIDE 5 MG PO TABS: 5.0000 mg | ORAL_TABLET | Freq: Every evening | ORAL | 0 refills | Status: DC

## 2019-11-30 NOTE — Progress Notes (Signed)
[facility] Telemedicine Visit  Patient consented to have virtual visit. Method of visit: Video  Patient declined interpreter.  Encounter participants: Patient: Cindy Howell - located at home Provider: Bonnita Hollow - located at office Others (if applicable): Not applicable  Chief Complaint: Rash  HPI:  Patient reports that she has had rash on her bilateral forearms and lower legs for the past 1 week.  Is slightly getting worse.  It is itchy.  She has been outside more recently and has come into contact with some plants, she is unsure if this was poison ivy.  Does not think that she was bit by an insect.  No one else around her has had these rashes.  It is also flaky and dry.  Patient has put some lotion on them but it is made it worse.  She has not tried anything else.  Patient denies any fevers or chills.  She states she had a rash like this before when she was very young.  Has not had a rash like this in many years.  Denies any new contact with soaps, other lotions or skin irritants.  ROS: per HPI  Pertinent PMHx: Has been seen in the past for eczematous type rash  Exam:  Gen: NAD, resting comfortably HEENT: EOMI Pulm: NWOB Skin: There are some red lesions on the forearms, however the video quality is poor and cannot clearly delineate the morphology well Neuro: no facial asymmetry or dysmetria Psych: Normal affect   Assessment/Plan:  Contact dermatitis -Hydrocortisone 1% twice daily -Eucerin -Levocetirizine -Follow-up as needed    Time spent during visit with patient: 13 minutes

## 2019-11-30 NOTE — Assessment & Plan Note (Signed)
-  Hydrocortisone 1% twice daily -Eucerin -Levocetirizine -Follow-up as needed

## 2019-12-02 ENCOUNTER — Telehealth: Payer: Self-pay | Admitting: Family Medicine

## 2019-12-02 NOTE — Telephone Encounter (Signed)
Patient is calling to ask Dr. Garlan Fillers if he can send in a new referral for a dermatologist. She is having issues with her rashes/pimples again. She does not want to go back to [facility].

## 2019-12-03 ENCOUNTER — Other Ambulatory Visit: Payer: Self-pay | Admitting: Family Medicine

## 2019-12-03 DIAGNOSIS — R21 Rash and other nonspecific skin eruption: Secondary | ICD-10-CM

## 2019-12-03 NOTE — Progress Notes (Signed)
Patient requests new referral to different Dermatologist.

## 2019-12-06 DIAGNOSIS — L853 Xerosis cutis: Secondary | ICD-10-CM | POA: Diagnosis not present

## 2019-12-06 DIAGNOSIS — L700 Acne vulgaris: Secondary | ICD-10-CM | POA: Diagnosis not present

## 2019-12-10 ENCOUNTER — Ambulatory Visit: Payer: Medicaid Other | Admitting: Family Medicine

## 2019-12-13 ENCOUNTER — Other Ambulatory Visit: Payer: Self-pay

## 2019-12-13 ENCOUNTER — Telehealth (INDEPENDENT_AMBULATORY_CARE_PROVIDER_SITE_OTHER): Payer: Medicaid Other | Admitting: Family Medicine

## 2019-12-13 DIAGNOSIS — L700 Acne vulgaris: Secondary | ICD-10-CM

## 2019-12-13 MED ORDER — BENZOYL PEROXIDE WASH 5 % EX LIQD: Freq: Two times a day (BID) | CUTANEOUS | 12 refills | Status: DC

## 2019-12-13 NOTE — Progress Notes (Signed)
[facility] Telemedicine Visit  Patient consented to have virtual visit. Method of visit: Telephone  Encounter participants: Patient: Cindy Howell - located at home in [facility] Provider: Nuala Alpha - located at [facility] Others (if applicable): none  Chief Complaint: Acne  HPI:  Patient has mild acne and has had recent breakouts. She had previously requested a referral to a dermatologist and she was seen by a provider at [facility] but called me and asked for another referral to a different provider as she was somewhat unsatisfied with her previous dermatology appointment. I have sent a new referral and in the meantime patient asks for something that can help.  ROS: per HPI  Pertinent PMHx: acne  Exam:  Respiratory: speaks in full sentences  Assessment/Plan:  Comedonal acne Prescribed benzoyl peroxide face wash to use with OTC face moisturizer cream to prevent skin from peeling and becoming too dry.    Time spent during visit with patient: >15 minutes  Harolyn Rutherford, [place], PGY-3

## 2019-12-14 NOTE — Assessment & Plan Note (Signed)
Prescribed benzoyl peroxide face wash to use with OTC face moisturizer cream to prevent skin from peeling and becoming too dry.

## 2020-02-03 ENCOUNTER — Ambulatory Visit (INDEPENDENT_AMBULATORY_CARE_PROVIDER_SITE_OTHER): Payer: Medicaid Other | Admitting: Family Medicine

## 2020-02-03 ENCOUNTER — Other Ambulatory Visit: Payer: Self-pay

## 2020-02-03 VITALS — BP 114/80 | HR 85 | Wt 198.4 lb

## 2020-02-03 DIAGNOSIS — L700 Acne vulgaris: Secondary | ICD-10-CM | POA: Diagnosis not present

## 2020-02-03 DIAGNOSIS — K219 Gastro-esophageal reflux disease without esophagitis: Secondary | ICD-10-CM | POA: Diagnosis not present

## 2020-02-03 MED ORDER — OMEPRAZOLE 40 MG PO CPDR: 40.0000 mg | DELAYED_RELEASE_CAPSULE | Freq: Every day | ORAL | 0 refills | Status: DC

## 2020-02-03 MED ORDER — CLINDAMYCIN PHOS-BENZOYL PEROX 1-5 % EX GEL: Freq: Two times a day (BID) | CUTANEOUS | 0 refills | Status: DC

## 2020-02-03 NOTE — Patient Instructions (Signed)
It was great to see you today! Thank you for letting me participate in your care!  Today, we discussed your acne and I have prescribed a topical medication for you to use twice a day for the next few months. Please use it in the morning and in the evening before bed and then apply some moisturizer cream. Please use Mederma for the area on your cheek.    Be well, Harolyn Rutherford, DO PGY-3, [facility]

## 2020-02-03 NOTE — Assessment & Plan Note (Signed)
Adding Clindamycin-Benzyl Peroxide facial wash to be used BID. Advised she use over the counter Mederma

## 2020-02-03 NOTE — Progress Notes (Signed)
    SUBJECTIVE:   CHIEF COMPLAINT / HPI:   Acne Patient continues to have breakouts with moderate pustular comedone acne mostly on the face. She tried benzyl peroxide facial wash with little improvement. She states she never used to have break outs like this before until after IUD. She had a large pustular bump on her left cheek that she popped the other day. She is now worried she will have some scarring. No other symptoms. No fever, chills, cough, or sore throat.  PERTINENT  PMH / PSH: GERD  OBJECTIVE:   BP 114/80   Pulse 85   Wt 198 lb 6.4 oz (90 kg)   SpO2 97%   BMI 34.06 kg/m   Gen: NAD Skin: Facial comedones located on the forehead, buccal and nasolabial areas of the face. Mild pustular acne mixed in. No obvious scarring.  ASSESSMENT/PLAN:   Comedonal acne Adding Clindamycin-Benzyl Peroxide facial wash to be used BID. Advised she use over the counter [place], [place]

## 2020-02-10 ENCOUNTER — Telehealth: Payer: Self-pay

## 2020-02-10 NOTE — Telephone Encounter (Signed)
Patient calls nurse line stating that benzaclin gel is not covered by medicaid. Please see below medicaid formulary of preferred drugs. Please advise if change is appropriate or if PA process should be initiated.   To PCP  Please advise  Talbot Grumbling, RN

## 2020-02-16 ENCOUNTER — Other Ambulatory Visit: Payer: Self-pay | Admitting: Family Medicine

## 2020-02-16 MED ORDER — CLINDAMYCIN PHOS-BENZOYL PEROX 1-5 % EX GEL: Freq: Two times a day (BID) | CUTANEOUS | 0 refills | Status: DC

## 2020-02-16 NOTE — Progress Notes (Signed)
Ordered generic as name brand not covered by Medicaid

## 2020-02-18 ENCOUNTER — Telehealth: Payer: Self-pay | Admitting: Family Medicine

## 2020-02-18 NOTE — Telephone Encounter (Signed)
Pt is calling because she said that Medicaid will not pay for one of her medications. She would like to speak to the doctor to see what other options there are. Blima Rich

## 2020-02-22 ENCOUNTER — Other Ambulatory Visit: Payer: Self-pay

## 2020-02-22 ENCOUNTER — Telehealth (INDEPENDENT_AMBULATORY_CARE_PROVIDER_SITE_OTHER): Payer: Medicaid Other | Admitting: Student in an Organized Health Care Education/Training Program

## 2020-02-22 DIAGNOSIS — J3089 Other allergic rhinitis: Secondary | ICD-10-CM | POA: Diagnosis not present

## 2020-02-22 DIAGNOSIS — L700 Acne vulgaris: Secondary | ICD-10-CM | POA: Diagnosis not present

## 2020-02-22 MED ORDER — FLUTICASONE PROPIONATE 50 MCG/ACT NA SUSP: 2.0000 | Freq: Every day | NASAL | 0 refills | Status: DC

## 2020-02-22 MED ORDER — CLINDAMYCIN PHOSPHATE 1 % EX GEL: Freq: Two times a day (BID) | CUTANEOUS | 0 refills | Status: DC

## 2020-02-22 NOTE — Progress Notes (Signed)
Tabiona was able to connect with patient for the pre-appointment screening and described to the patient that I would be contacting her via text or phone call for the appointment. She declined an interpretor at that time.  [facility] Telemedicine Visit  Patient consented to have virtual visit and was identified by name and date of birth. Method of visit: Video  Encounter participants: Patient: Cindy Howell - located at home Provider: Richarda Osmond - located at [facility] Others (if applicable): [place]  Chief Complaint: runny nose and body aches.   HPI: 3 days runny nose with sinus congestion and body aches. Its getting better. Voice has changed and is raspy. Clear drainage from nose. Little bit of dry cough and sneezing. Her nose is itchy. Has pollen allergies. Uses nasal spray. Robitussin OTC for cough, tylenol for body aches is helping. Denies fever or chills. Eating and drinking like regular. No sore throat, SOB. No sick contacts Completed covid vaccines >2 weeks ago.   ROS: per HPI  Pertinent PMHx: acne  Exam:  Wt 198 lb (89.8 kg) Comment: pt reported  BMI 33.99 kg/m   Respiratory: negative for respiratory distress  No tenderness or swollen lymph nodes.  Negative sinus tenderness to percussion.  Cystic acne on cheeks and forehead.  Assessment/Plan:  Allergic rhinitis allergic symptoms similar to previous with possible concomitant mild viral infection without signs of sinusitis.  Continue flonase- will prescribe new Continue to drink plenty of fluids. Can use honey for the cough Follow up by the end of the week if not improved.   Acne Prescribed treatment by PCP which was not covered by insurance. Patient requesting alternative. Sent in clindamycin cream and directed patient to get other component OTC (benzoyl peroxide).   Time spent during visit with patient: 12 minutes

## 2020-02-22 NOTE — Telephone Encounter (Signed)
Called patient with [place] interpretor.  No answer, left VM using interpretor that advised to call back if she still has questions about her medication.

## 2020-02-24 ENCOUNTER — Other Ambulatory Visit: Payer: Self-pay

## 2020-02-24 ENCOUNTER — Ambulatory Visit (INDEPENDENT_AMBULATORY_CARE_PROVIDER_SITE_OTHER): Payer: Medicaid Other | Admitting: Family Medicine

## 2020-02-24 DIAGNOSIS — E6609 Other obesity due to excess calories: Secondary | ICD-10-CM | POA: Diagnosis not present

## 2020-02-24 DIAGNOSIS — Z0130 Encounter for examination of blood pressure without abnormal findings: Secondary | ICD-10-CM | POA: Diagnosis not present

## 2020-02-24 DIAGNOSIS — L709 Acne, unspecified: Secondary | ICD-10-CM | POA: Insufficient documentation

## 2020-02-24 NOTE — Assessment & Plan Note (Signed)
Patient presenting for concerns of elevated blood pressures.  [facility] and had adequate elevated blood pressure there.  At home blood pressures were also elevated.  Patient demonstrated how blood pressures were checked both at second center and at home.  It appears she had her hand dropped and not rested against her heart as is required by wrist monitors.  Likely inaccurate readings due to positions.  Here in office measured manually blood pressures were normal.  Patient reassured that blood pressures were normal.  That being said patient does have 9 pound weight gain since February.  We discussed healthy diet and daily exercise.  Patient seems to have difficulty understanding nutrition so we will place referral to for nutritionist.  Patient given information for Dr. Jenne Campus and plans to call to schedule his appointment.  Also discussed daily exercise and aerobic exercises.  Patient to follow-up with PCP and Dr. Jenne Campus.

## 2020-02-24 NOTE — Assessment & Plan Note (Signed)
Prescribed treatment by PCP which was not covered by insurance. Patient requesting alternative. Sent in clindamycin cream and directed patient to get other component OTC (benzoyl peroxide).

## 2020-02-24 NOTE — Progress Notes (Signed)
    SUBJECTIVE:   CHIEF COMPLAINT / HPI:   Elevated BP Patient presents today for concern of elevated BP. 2 days ago at skin center her BP was 148/97 & 150/94. She came home and yesterday evening her BP was 148/104, 145/94. Patient has been gaining weight after the birth of her daughter so she was concerned she developed HTN. At both the skin center and at home BP was not measured accurately, she was not keeping arm at heart level with wrist monitor.   Patient reports diet full of carbohydrates, rice, roti. She is trying to limit them but she is unable to do well in this. Limiting fried and oily foods. Would like to talk to a nutritionist.   Patient is not exercising. Sometimes walks.   PERTINENT  PMH / PSH: GERD, GDM, family h/o HLD  OBJECTIVE:   BP 110/76   Pulse 94   Ht 5\' 4"  (1.626 m)   Wt 199 lb 3.2 oz (90.4 kg)   SpO2 98%   BMI 34.19 kg/m   Gen: awake and alert, NAD Cardio: RRR, no MRG, 2+ radial pulses  Resp: CTAB, no wheezes, rales or rhonchi GI: soft, non tender, non distended, bowel sounds present Ext: no edema  ASSESSMENT/PLAN:   Blood pressure check Patient presenting for concerns of elevated blood pressures.  Was seen at skin center 2 days ago and had adequate elevated blood pressure there.  At home blood pressures were also elevated.  Patient demonstrated how blood pressures were checked both at second center and at home.  It appears she had her hand dropped and not rested against her heart as is required by wrist monitors.  Likely inaccurate readings due to positions.  Here in office measured manually blood pressures were normal.  Patient reassured that blood pressures were normal.  That being said patient does have 9 pound weight gain since February.  We discussed healthy diet and daily exercise.  Patient seems to have difficulty understanding nutrition so we will place referral to for nutritionist.  Patient given information for Dr. Jenne Campus and plans to call to  schedule his appointment.  Also discussed daily exercise and aerobic exercises.  Patient to follow-up with PCP and Dr. Jenne Campus.     Caroline More, [place]

## 2020-02-24 NOTE — Patient Instructions (Signed)
Exercising to Stay Healthy To become healthy and stay healthy, it is recommended that you do moderate-intensity and vigorous-intensity exercise. You can tell that you are exercising at a moderate intensity if your heart starts beating faster and you start breathing faster but can still hold a conversation. You can tell that you are exercising at a vigorous intensity if you are breathing much harder and faster and cannot hold a conversation while exercising. Exercising regularly is important. It has many health benefits, such as:  Improving overall fitness, flexibility, and endurance.  Increasing bone density.  Helping with weight control.  Decreasing body fat.  Increasing muscle strength.  Reducing stress and tension.  Improving overall health. How often should I exercise? Choose an activity that you enjoy, and set realistic goals. Your health care provider can help you make an activity plan that works for you. Exercise regularly as told by your health care provider. This may include:  Doing strength training two times a week, such as: ? Lifting weights. ? Using resistance bands. ? Push-ups. ? Sit-ups. ? Yoga.  Doing a certain intensity of exercise for a given amount of time. Choose from these options: ? A total of 150 minutes of moderate-intensity exercise every week. ? A total of 75 minutes of vigorous-intensity exercise every week. ? A mix of moderate-intensity and vigorous-intensity exercise every week. Children, pregnant women, people who have not exercised regularly, people who are overweight, and older adults may need to talk with a health care provider about what activities are safe to do. If you have a medical condition, be sure to talk with your health care provider before you start a new exercise program. What are some exercise ideas? Moderate-intensity exercise ideas include:  Walking 1 mile (1.6 km) in about 15  minutes.  Biking.  Hiking.  Golfing.  Dancing.  Water aerobics. Vigorous-intensity exercise ideas include:  Walking 4.5 miles (7.2 km) or more in about 1 hour.  Jogging or running 5 miles (8 km) in about 1 hour.  Biking 10 miles (16.1 km) or more in about 1 hour.  Lap swimming.  Roller-skating or in-line skating.  Cross-country skiing.  Vigorous competitive sports, such as football, basketball, and soccer.  Jumping rope.  Aerobic dancing. What are some everyday activities that can help me to get exercise?  Yard work, such as: ? Pushing a lawn mower. ? Raking and bagging leaves.  Washing your car.  Pushing a stroller.  Shoveling snow.  Gardening.  Washing windows or floors. How can I be more active in my day-to-day activities?  Use stairs instead of an elevator.  Take a walk during your lunch break.  If you drive, park your car farther away from your work or school.  If you take public transportation, get off one stop early and walk the rest of the way.  Stand up or walk around during all of your indoor phone calls.  Get up, stretch, and walk around every 30 minutes throughout the day.  Enjoy exercise with a friend. Support to continue exercising will help you keep a regular routine of activity. What guidelines can I follow while exercising?  Before you start a new exercise program, talk with your health care provider.  Do not exercise so much that you hurt yourself, feel dizzy, or get very short of breath.  Wear comfortable clothes and wear shoes with good support.  Drink plenty of water while you exercise to prevent dehydration or heat stroke.  Work out until your breathing   and your heartbeat get faster. Where to find more information  U.S. Department of Health and Human Services: www.hhs.gov  Centers for Disease Control and Prevention (CDC): www.cdc.gov Summary  Exercising regularly is important. It will improve your overall fitness,  flexibility, and endurance.  Regular exercise also will improve your overall health. It can help you control your weight, reduce stress, and improve your bone density.  Do not exercise so much that you hurt yourself, feel dizzy, or get very short of breath.  Before you start a new exercise program, talk with your health care provider. This information is not intended to replace advice given to you by your health care provider. Make sure you discuss any questions you have with your health care provider. Document Revised: 08/15/2017 Document Reviewed: 07/24/2017 Elsevier Patient Education  2020 Elsevier Inc. Healthy Eating Following a healthy eating pattern may help you to achieve and maintain a healthy body weight, reduce the risk of chronic disease, and live a long and productive life. It is important to follow a healthy eating pattern at an appropriate calorie level for your body. Your nutritional needs should be met primarily through food by choosing a variety of nutrient-rich foods. What are tips for following this plan? Reading food labels  Read labels and choose the following: ? Reduced or low sodium. ? Juices with 100% fruit juice. ? Foods with low saturated fats and high polyunsaturated and monounsaturated fats. ? Foods with whole grains, such as whole wheat, cracked wheat, brown rice, and wild rice. ? Whole grains that are fortified with folic acid. This is recommended for women who are pregnant or who want to become pregnant.  Read labels and avoid the following: ? Foods with a lot of added sugars. These include foods that contain brown sugar, corn sweetener, corn syrup, dextrose, fructose, glucose, high-fructose corn syrup, honey, invert sugar, lactose, malt syrup, maltose, molasses, raw sugar, sucrose, trehalose, or turbinado sugar.  Do not eat more than the following amounts of added sugar per day:  6 teaspoons (25 g) for women.  9 teaspoons (38 g) for men. ? Foods that  contain processed or refined starches and grains. ? Refined grain products, such as white flour, degermed cornmeal, white bread, and white rice. Shopping  Choose nutrient-rich snacks, such as vegetables, whole fruits, and nuts. Avoid high-calorie and high-sugar snacks, such as potato chips, fruit snacks, and candy.  Use oil-based dressings and spreads on foods instead of solid fats such as butter, stick margarine, or cream cheese.  Limit pre-made sauces, mixes, and "instant" products such as flavored rice, instant noodles, and ready-made pasta.  Try more plant-protein sources, such as tofu, tempeh, black beans, edamame, lentils, nuts, and seeds.  Explore eating plans such as the Mediterranean diet or vegetarian diet. Cooking  Use oil to saut or stir-fry foods instead of solid fats such as butter, stick margarine, or lard.  Try baking, boiling, grilling, or broiling instead of frying.  Remove the fatty part of meats before cooking.  Steam vegetables in water or broth. Meal planning   At meals, imagine dividing your plate into fourths: ? One-half of your plate is fruits and vegetables. ? One-fourth of your plate is whole grains. ? One-fourth of your plate is protein, especially lean meats, poultry, eggs, tofu, beans, or nuts.  Include low-fat dairy as part of your daily diet. Lifestyle  Choose healthy options in all settings, including home, work, school, restaurants, or stores.  Prepare your food safely: ? Wash your hands   after handling raw meats. ? Keep food preparation surfaces clean by regularly washing with hot, soapy water. ? Keep raw meats separate from ready-to-eat foods, such as fruits and vegetables. ? Cook seafood, meat, poultry, and eggs to the recommended internal temperature. ? Store foods at safe temperatures. In general:  Keep cold foods at 40F (4.4C) or below.  Keep hot foods at 140F (60C) or above.  Keep your freezer at 0F (-17.8C) or  below.  Foods are no longer safe to eat when they have been between the temperatures of 40-140F (4.4-60C) for more than 2 hours. What foods should I eat? Fruits Aim to eat 2 cup-equivalents of fresh, canned (in natural juice), or frozen fruits each day. Examples of 1 cup-equivalent of fruit include 1 small apple, 8 large strawberries, 1 cup canned fruit,  cup dried fruit, or 1 cup 100% juice. Vegetables Aim to eat 2-3 cup-equivalents of fresh and frozen vegetables each day, including different varieties and colors. Examples of 1 cup-equivalent of vegetables include 2 medium carrots, 2 cups raw, leafy greens, 1 cup chopped vegetable (raw or cooked), or 1 medium baked potato. Grains Aim to eat 6 ounce-equivalents of whole grains each day. Examples of 1 ounce-equivalent of grains include 1 slice of bread, 1 cup ready-to-eat cereal, 3 cups popcorn, or  cup cooked rice, pasta, or cereal. Meats and other proteins Aim to eat 5-6 ounce-equivalents of protein each day. Examples of 1 ounce-equivalent of protein include 1 egg, 1/2 cup nuts or seeds, or 1 tablespoon (16 g) peanut butter. A cut of meat or fish that is the size of a deck of cards is about 3-4 ounce-equivalents.  Of the protein you eat each week, try to have at least 8 ounces come from seafood. This includes salmon, trout, herring, and anchovies. Dairy Aim to eat 3 cup-equivalents of fat-free or low-fat dairy each day. Examples of 1 cup-equivalent of dairy include 1 cup (240 mL) milk, 8 ounces (250 g) yogurt, 1 ounces (44 g) natural cheese, or 1 cup (240 mL) fortified soy milk. Fats and oils  Aim for about 5 teaspoons (21 g) per day. Choose monounsaturated fats, such as canola and olive oils, avocados, peanut butter, and most nuts, or polyunsaturated fats, such as sunflower, corn, and soybean oils, walnuts, pine nuts, sesame seeds, sunflower seeds, and flaxseed. Beverages  Aim for six 8-oz glasses of water per day. Limit coffee to three  to five 8-oz cups per day.  Limit caffeinated beverages that have added calories, such as soda and energy drinks.  Limit alcohol intake to no more than 1 drink a day for nonpregnant women and 2 drinks a day for men. One drink equals 12 oz of beer (355 mL), 5 oz of wine (148 mL), or 1 oz of hard liquor (44 mL). Seasoning and other foods  Avoid adding excess amounts of salt to your foods. Try flavoring foods with herbs and spices instead of salt.  Avoid adding sugar to foods.  Try using oil-based dressings, sauces, and spreads instead of solid fats. This information is based on general U.S. nutrition guidelines. For more information, visit choosemyplate.gov. Exact amounts may vary based on your nutrition needs. Summary  A healthy eating plan may help you to maintain a healthy weight, reduce the risk of chronic diseases, and stay active throughout your life.  Plan your meals. Make sure you eat the right portions of a variety of nutrient-rich foods.  Try baking, boiling, grilling, or broiling instead of frying.    Choose healthy options in all settings, including home, work, school, restaurants, or stores. This information is not intended to replace advice given to you by your health care provider. Make sure you discuss any questions you have with your health care provider. Document Revised: 12/15/2017 Document Reviewed: 12/15/2017 Elsevier Patient Education  2020 Elsevier Inc.  

## 2020-02-24 NOTE — Assessment & Plan Note (Signed)
allergic symptoms similar to previous with possible concomitant mild viral infection without signs of sinusitis.  Continue flonase- will prescribe new Continue to drink plenty of fluids. Can use honey for the cough Follow up by the end of the week if not improved.

## 2020-03-11 IMAGING — US ULTRASOUND LEFT BREAST LIMITED
1 series · 6 of 6 positions shown · non-contrast
Comparison: None.

CLINICAL DATA: 33-year-old patient with palpable lump in 6 o'clock
position of the left breast retroareolar. The lump has been present
for approximately 2 months. The patient thinks that recently it has
decreased slightly in size. She has a 4-month-old infant, and
despite wanting to breast feed, has had difficulty producing milk.
She breastfed her first child for 3 years.

EXAM:
DIGITAL DIAGNOSTIC BILATERAL MAMMOGRAM WITH CAD AND TOMO
ULTRASOUND LEFT BREAST

[Series 1: ultrasound left breast limited · 0.06mm/px · 6 of 6 slices shown]
[im 1/6]
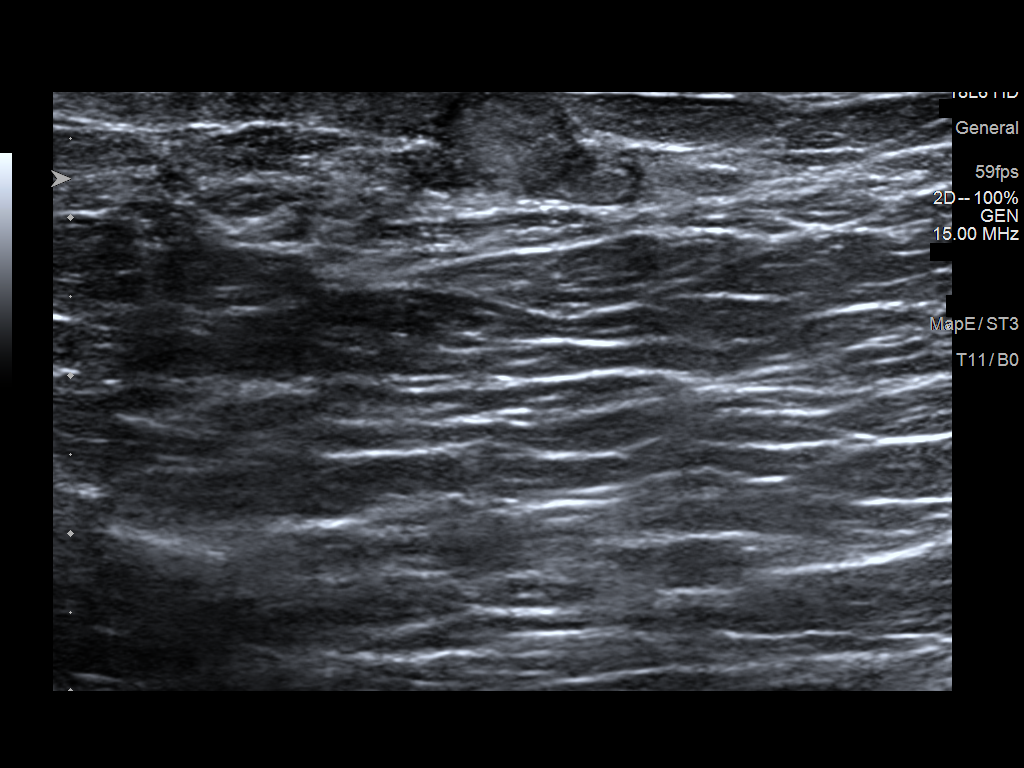
[im 2/6]
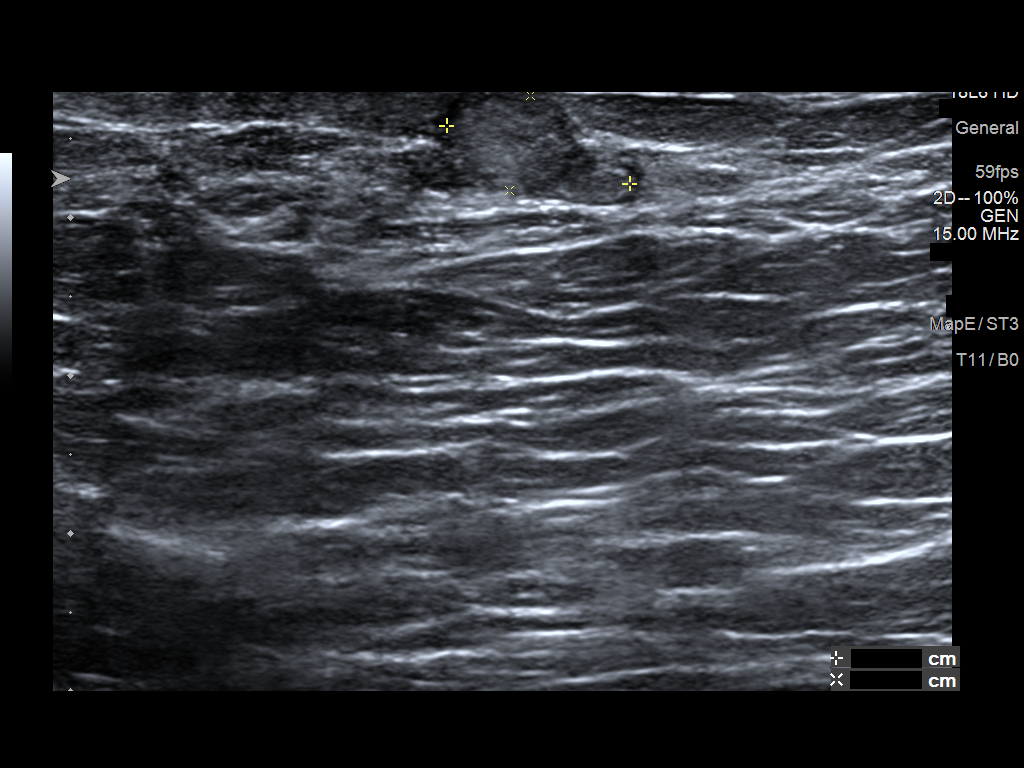
[im 3/6]
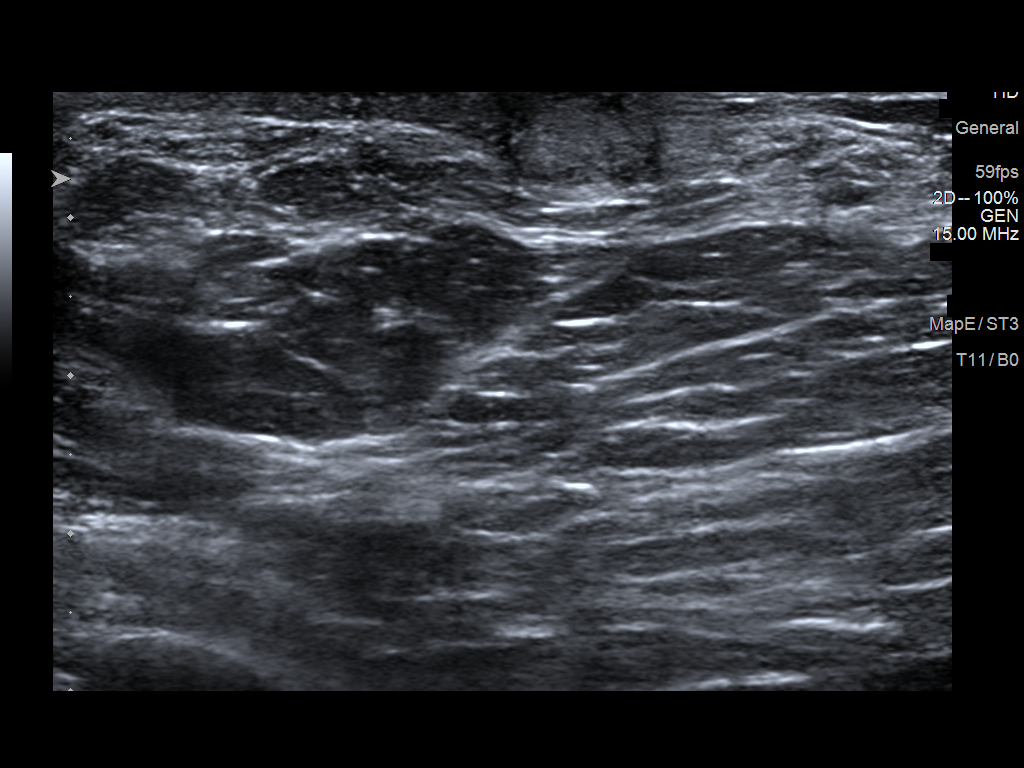
[im 4/6]
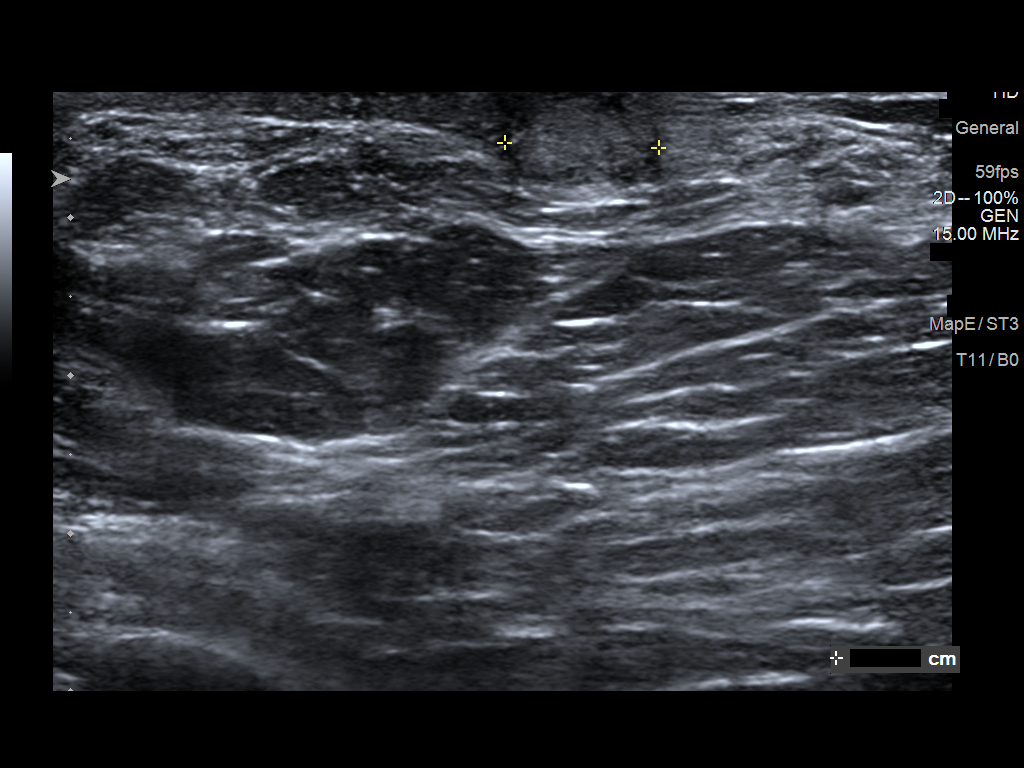
[im 5/6]
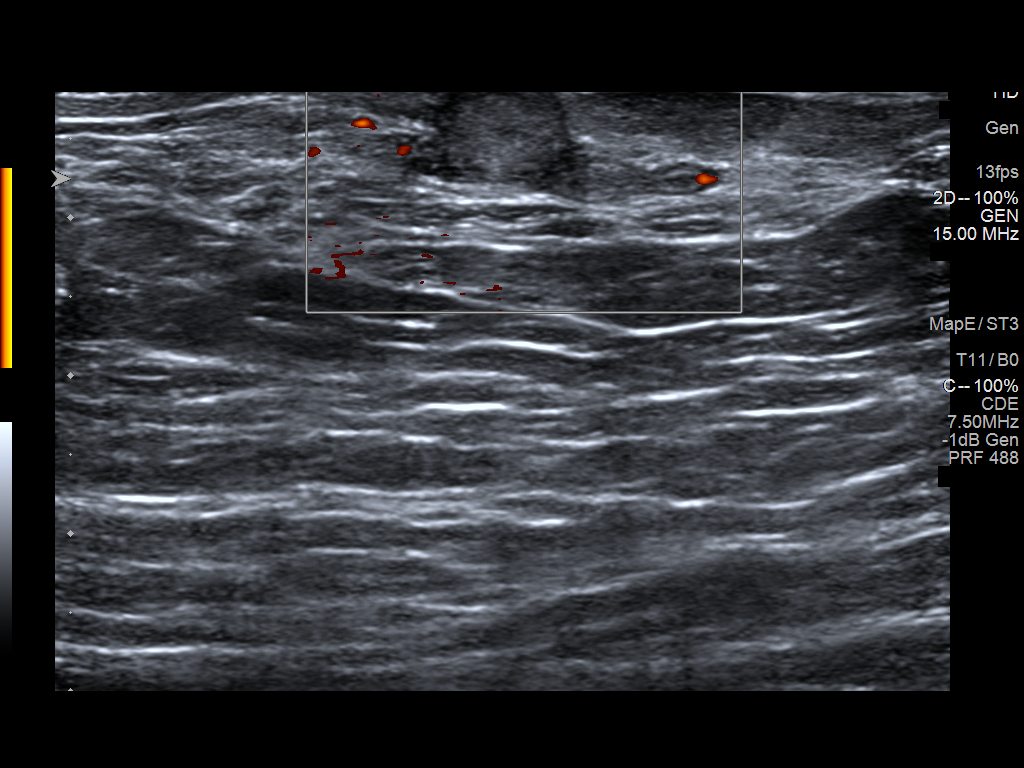
[im 6/6]
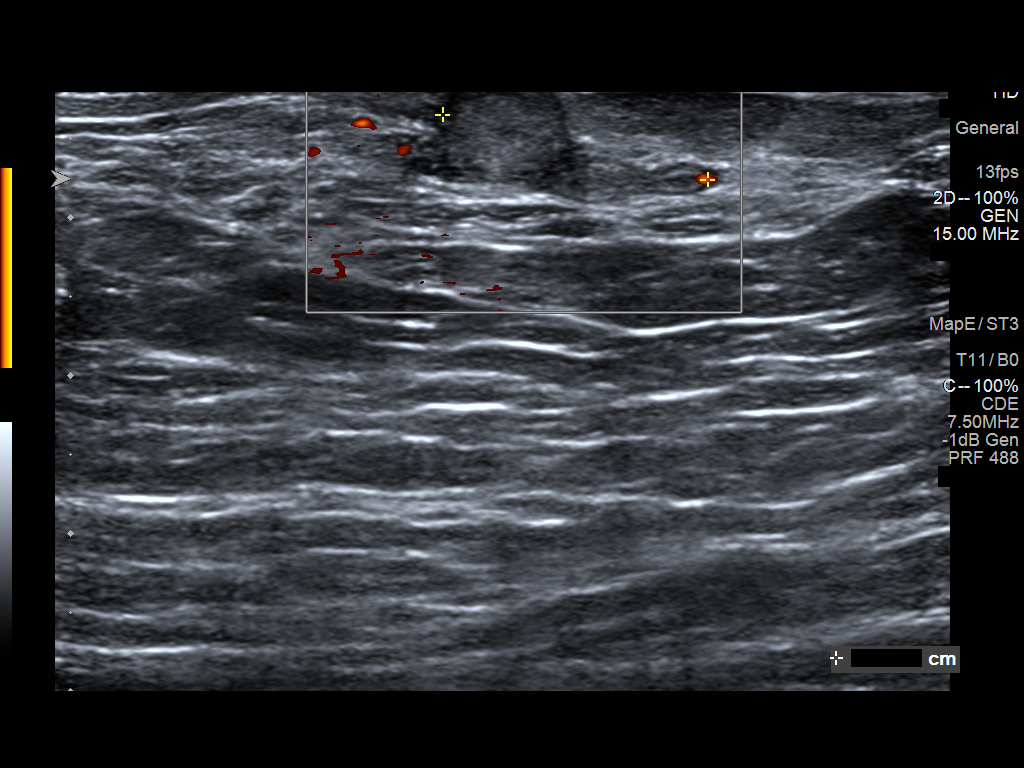

[6 of 6 positions shown; findings below may reference images not displayed]

Baseline exam.

ACR Breast Density Category b: There are scattered areas of
fibroglandular density.
FINDINGS: Metallic skin marker was placed in the region of palpable concern in
the retroareolar left breast. Spot tangential view of this region
shows partially circumscribed and partially obscured oval mass,
superficial in position. No additional mass is identified in the
left breast. No suspicious microcalcification or architectural
distortion on the left.

The right breast is negative.

Axillary regions included in the imaging field are negative.

Mammographic images were processed with CAD.

On physical exam, there is a discretely palpable and smooth
approximately 1 cm mass in the 6 o'clock left breast retroareolar.

Targeted ultrasound is performed, showing an oval circumscribed
gently lobulated superficial mass, echogenic, nearly isoechoic to
fat. The mass measures 1.7 x 1.2 x 0.6 cm. No associated vascular
flow. The surrounding breast parenchyma is normal.
IMPRESSION: Palpable left breast mass is probably benign, favored to be a
galactocele. Fibroadenoma felt to be less likely.

RECOMMENDATION:
Ultrasound follow-up is suggested in 2 months.

No evidence of malignancy in the right breast.

I have discussed the findings and recommendations with the patient.
Results were also provided in writing at the conclusion of the
visit. If applicable, a reminder letter will be sent to the patient
regarding the next appointment.

BI-RADS CATEGORY  3: Probably benign.

## 2020-03-11 IMAGING — MG DIGITAL DIAGNOSTIC BILATERAL MAMMOGRAM WITH TOMO AND CAD
6 of 10 series · 6 of 30 positions shown · non-contrast
Comparison: None.

CLINICAL DATA: 33-year-old patient with palpable lump in 6 o'clock
position of the left breast retroareolar. The lump has been present
for approximately 2 months. The patient thinks that recently it has
decreased slightly in size. She has a 4-month-old infant, and
despite wanting to breast feed, has had difficulty producing milk.
She breastfed her first child for 3 years.

EXAM:
DIGITAL DIAGNOSTIC BILATERAL MAMMOGRAM WITH CAD AND TOMO
ULTRASOUND LEFT BREAST

[R CC synth-2D]
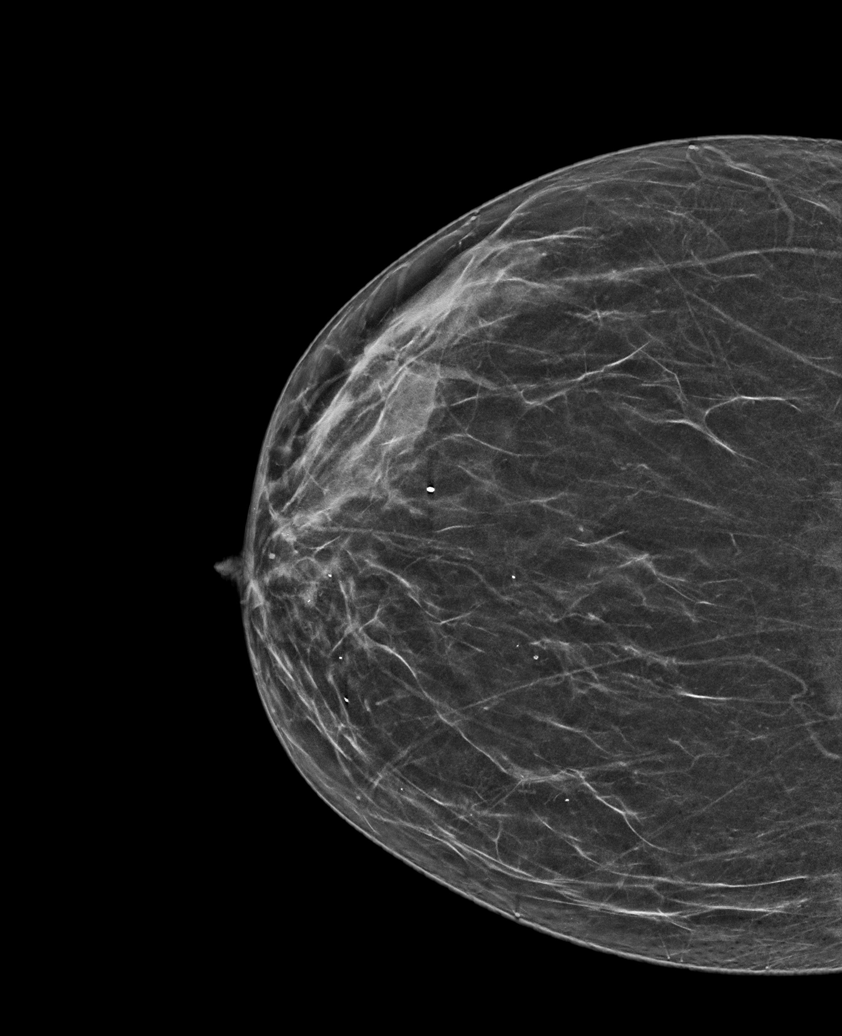

[L CC synth-2D]
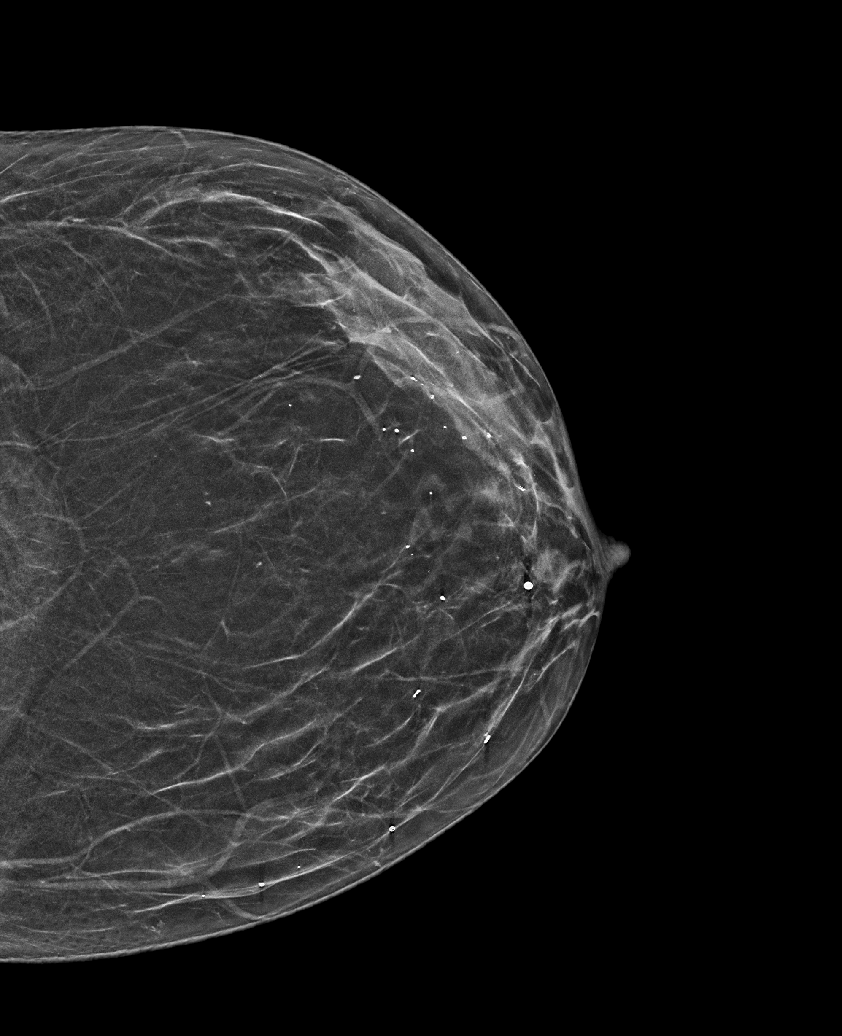

[L TAN synth-2D]
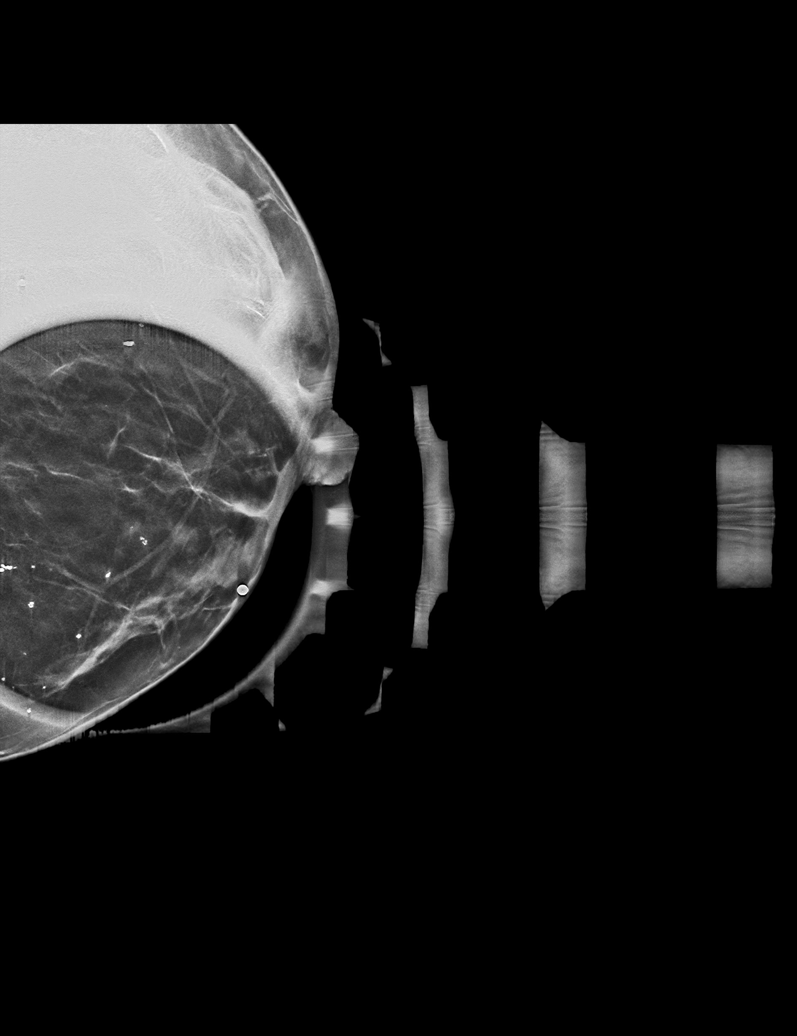

[R MLO synth-2D]
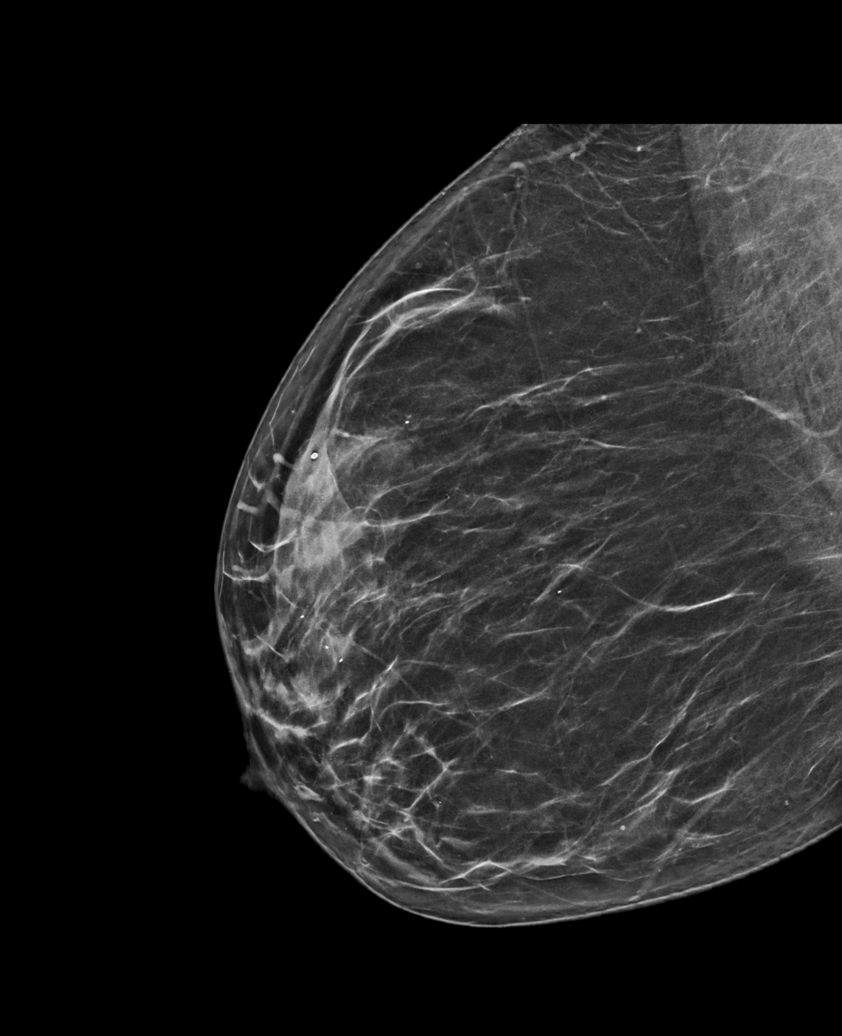

[L MLO synth-2D]
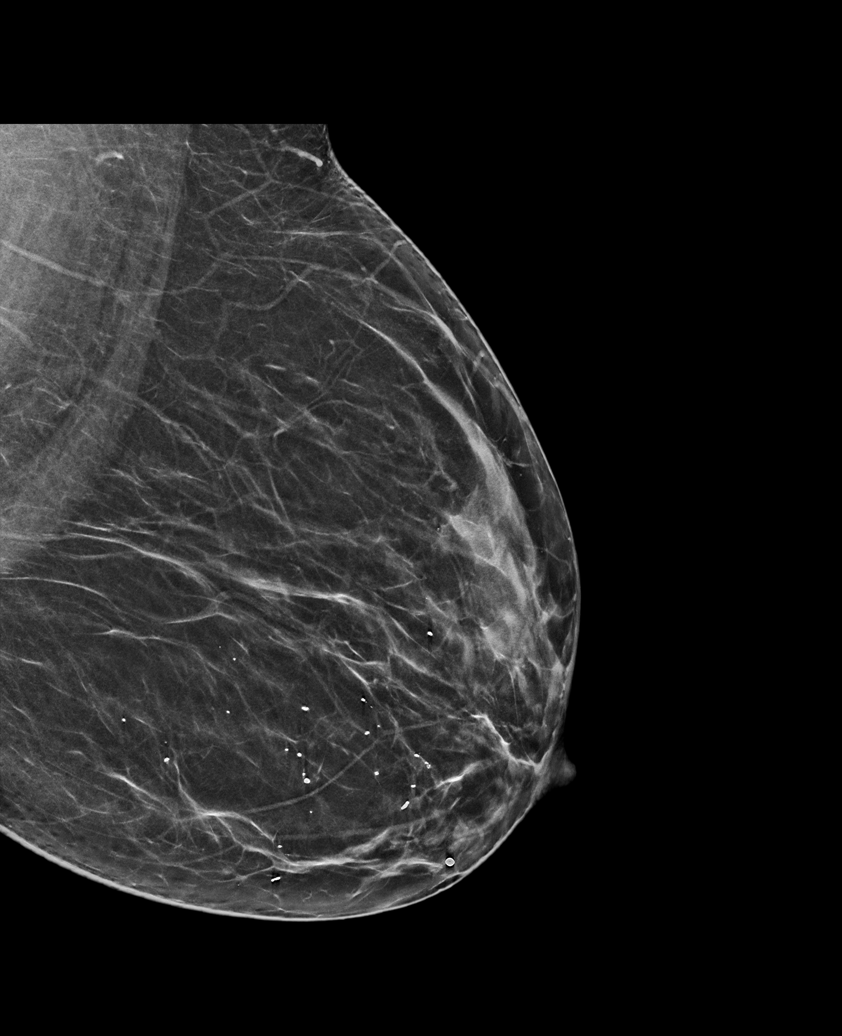

[L TAN tomo · tomo slice 22/43.0]
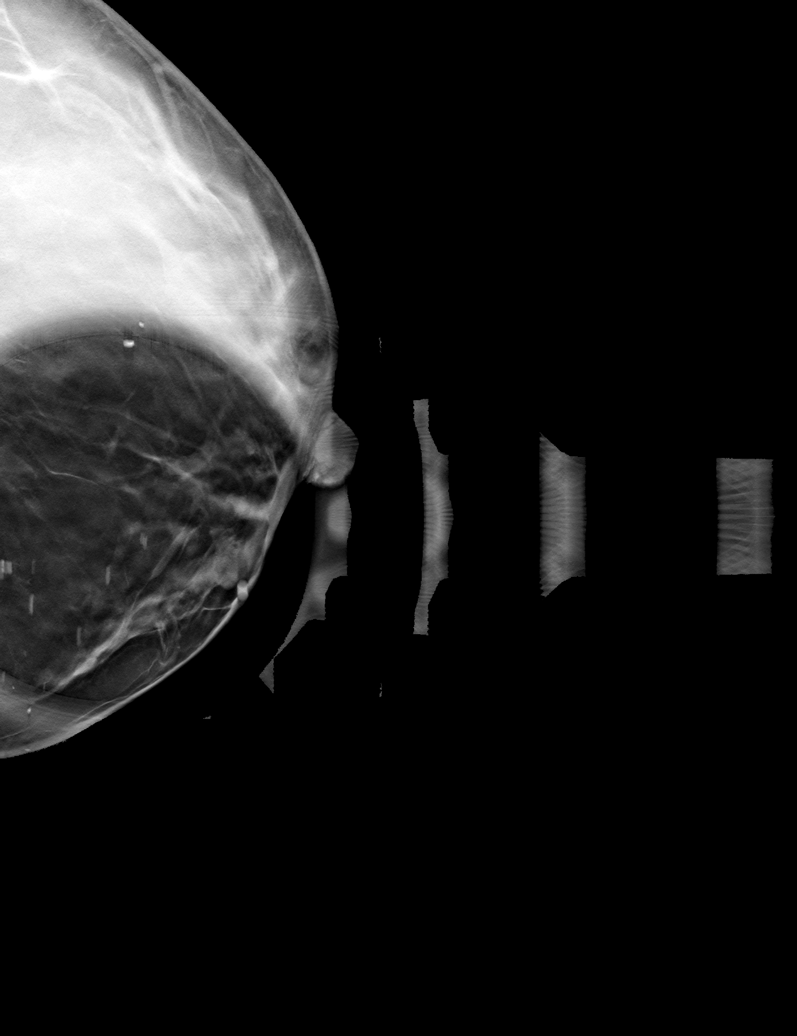

[6 of 30 positions shown; findings below may reference images not displayed]

Baseline exam.

ACR Breast Density Category b: There are scattered areas of
fibroglandular density.
FINDINGS: Metallic skin marker was placed in the region of palpable concern in
the retroareolar left breast. Spot tangential view of this region
shows partially circumscribed and partially obscured oval mass,
superficial in position. No additional mass is identified in the
left breast. No suspicious microcalcification or architectural
distortion on the left.

The right breast is negative.

Axillary regions included in the imaging field are negative.

Mammographic images were processed with CAD.

On physical exam, there is a discretely palpable and smooth
approximately 1 cm mass in the 6 o'clock left breast retroareolar.

Targeted ultrasound is performed, showing an oval circumscribed
gently lobulated superficial mass, echogenic, nearly isoechoic to
fat. The mass measures 1.7 x 1.2 x 0.6 cm. No associated vascular
flow. The surrounding breast parenchyma is normal.
IMPRESSION: Palpable left breast mass is probably benign, favored to be a
galactocele. Fibroadenoma felt to be less likely.

RECOMMENDATION:
Ultrasound follow-up is suggested in 2 months.

No evidence of malignancy in the right breast.

I have discussed the findings and recommendations with the patient.
Results were also provided in writing at the conclusion of the
visit. If applicable, a reminder letter will be sent to the patient
regarding the next appointment.

BI-RADS CATEGORY  3: Probably benign.

## 2020-04-11 ENCOUNTER — Ambulatory Visit: Payer: Medicaid Other

## 2020-04-11 NOTE — Patient Instructions (Addendum)
It was nice meeting you  today!  Referral sent to [facility], they wil call you with an appointment  If you have any questions or concerns, please feel free to call the clinic at [phone]  Be well,  Carollee Leitz, MD [facility] Medicine Residency    [email or web address]  Outpatient Mental Health Providers (No Insurance required or Self Pay)  [facility]) can see uninsured folks for outpatient therapy [email or web address] [address] [place], [place] [address] [phone]  [place] Mon-Fri, 8am-3pm [email or web address], [place], [place]   [place] [phone]- [facility] for psych med management, there may be a wait- if [facility] is working with clients for OPT, they will coordinate with [name] for [place]   Walk-in-Clinic: Monday- Friday 9:00 AM - 4:00 PM [place], [place] [phone]  [facility] [place] ([employer]) walk in M-F 8am-12pm and  1pm-3pm [place]  [place]  Phone: [phone]  [employer] ([place] and substance challenges) [address], [place] [phone]    [email or web address] of the [place], [place]     Phone:  [phone] [place]  [place]  [place]  [phone] [email or web address]   [facility] ( virtual or zoom therapy) [email or web address]  [place]  [place]    [facility] [phone]  grief counseling, dementia and caregiver support    [facility] Walk-in MWF 12:30 to 3:00     [place] [address]  [phone]  [email or web address] call to schedule an  appointment    [place] ,Support group, Peer support services, [address] [place], [place] [address] 336- [phone]  [email or web address]           National Alliance on Mental Illness (NAMI) [place]- Wellness classes, Support groups        [address], [place], [place] [address] [phone]   CurrentJokes.cz   [facility]  (Psycho-social Rehabilitation clubhouse, Individual and group therapy) [address], [place] [address]   [phone]  24- Hour Availability:  *[place] or [phone] * [facility] [employer] (Domestic Violence, Rape, etc. )[phone] [place] [phone] or [phone] * [place] [phone] only) [phone] (after hours) *[facility] Mobile Crisis Unit [phone] *[place] National Suicide Hotline (530)028-6042 Diamantina Monks)

## 2020-04-11 NOTE — Progress Notes (Addendum)
    SUBJECTIVE:   CHIEF COMPLAINT / HPI: Wanting to talk about mental health issues.  Patient reports no history of anxiety or depression.  She is from [place] and has moved here with her husband 4 years ago.  She reports having multiple masters degrees and is unable to work here as they are [employer] visa.  Her husband is currently a PhD student and is very busy so she tell tells me that she is home by herself with 2 children 24 hours a day.  She finds it lonely as she has no family or friends and no social support in [place].  Her relationship with her husband has been off.  Given that he works a lot and not able to help at home.  She feels that they argue a lot and have a lot of stressors in her life.  She denies any suicidal or homicidal ideation.   PERTINENT  PMH / PSH:  None   OBJECTIVE:   BP 110/80   Pulse 83   Wt 197 lb (89.4 kg)   SpO2 96%   BMI 33.81 kg/m    General: Alert and oriented, no apparent distress  Cardiovascular: RRR with no murmurs noted Respiratory: CTA bilaterally  Gastrointestinal: Bowel sounds present. No abdominal pain MSK: Upper extremity strength 5/5 bilaterally, Lower extremity strength 5/5 bilaterally   Psych: Behavior and speech appropriate to situation, no suicidal or homicidal ideation  ASSESSMENT/PLAN:   Increased body weight Patient reports increasing weight after having last child.  Has no time for exercise and diet.  Discussed importance of implementing lifestyle changes. -Referral to nutrition therapy -HbA1c 6.5 today, patient has history of gestational diabetes.  Recheck again in 6 months. -HCV screening today   Stress at home Patient is requesting assistance with marital issues.  She would like to discuss couple counseling.  Denies any depression, suicidal or homicidal ideation.  Things to consider family is not from [place] and has no family or social support around them.  They are both on student visa but patient is not  working right now. -Refer [facility]CM -Follow-up 1-2 weeks     Carollee Leitz, MD [place]

## 2020-04-12 ENCOUNTER — Encounter: Payer: Self-pay | Admitting: Family Medicine

## 2020-04-12 ENCOUNTER — Other Ambulatory Visit: Payer: Self-pay

## 2020-04-12 ENCOUNTER — Ambulatory Visit: Payer: Medicaid Other | Admitting: Family Medicine

## 2020-04-12 VITALS — BP 110/80 | HR 83 | Wt 197.0 lb

## 2020-04-12 DIAGNOSIS — F439 Reaction to severe stress, unspecified: Secondary | ICD-10-CM | POA: Diagnosis not present

## 2020-04-12 DIAGNOSIS — Z0000 Encounter for general adult medical examination without abnormal findings: Secondary | ICD-10-CM | POA: Diagnosis not present

## 2020-04-12 DIAGNOSIS — Z658 Other specified problems related to psychosocial circumstances: Secondary | ICD-10-CM | POA: Diagnosis not present

## 2020-04-12 DIAGNOSIS — R635 Abnormal weight gain: Secondary | ICD-10-CM

## 2020-04-12 LAB — POCT GLYCOSYLATED HEMOGLOBIN (HGB A1C): Hemoglobin A1C: 6.4 % — AB (ref 4.0–5.6)

## 2020-04-12 NOTE — Assessment & Plan Note (Signed)
Patient reports increasing weight after having last child.  Has no time for exercise and diet.  Discussed importance of implementing lifestyle changes. -Referral to nutrition therapy -HbA1c 6.5 today, patient has history of gestational diabetes.  Recheck again in 6 months. -HCV screening today

## 2020-04-13 ENCOUNTER — Encounter: Payer: Self-pay | Admitting: Family Medicine

## 2020-04-13 LAB — HCV INTERPRETATION

## 2020-04-13 LAB — HCV AB W REFLEX TO QUANT PCR: HCV Ab: <0.1 s/co ratio (ref 0.0–0.9)

## 2020-04-14 ENCOUNTER — Telehealth: Payer: Self-pay | Admitting: *Deleted

## 2020-04-14 NOTE — Assessment & Plan Note (Signed)
Patient is requesting assistance with marital issues.  She would like to discuss couple counseling.  Denies any depression, suicidal or homicidal ideation.  Things to consider family is not from [place] and has no family or social support around them.  They are both on student visa but patient is not working right now. -Refer [facility]CM -Follow-up 1-2 weeks

## 2020-04-14 NOTE — Chronic Care Management (AMB) (Signed)
  Care Management   Note  04/14/2020 Name: Roshunda Keir MRN: [id number] DOB: 14-Jul-1986  Tekesha Almgren is a 34 y.o. year old female who is a primary care patient of Zola Button, MD. I reached out to [employer] by phone today in response to a referral sent by Ms. Grete Sperry's health plan.    Ms. Sforza was given information about care management services today including:  1. Care management services include personalized support from designated clinical staff supervised by her physician, including individualized plan of care and coordination with other care providers 2. 24/7 contact phone numbers for assistance for urgent and routine care needs. 3. The patient may stop care management services at any time by phone call to the office staff.  Patient agreed to services and verbal consent obtained.   Follow up plan: Telephone appointment with care management team member scheduled for: 04/19/2020  [place], [place], [place] [address] Direct Dial: [email or web address] Website: [email or web address]

## 2020-04-19 ENCOUNTER — Other Ambulatory Visit: Payer: Self-pay

## 2020-04-19 ENCOUNTER — Telehealth: Payer: Self-pay | Admitting: Licensed Clinical Social Worker

## 2020-04-19 ENCOUNTER — Telehealth: Payer: Medicaid Other

## 2020-04-19 NOTE — Chronic Care Management (AMB) (Signed)
    Clinical Social Work  Care Management Outreach   04/19/2020 Name: Cindy Howell MRN: [id number] DOB: 12/21/1985  Cindy Howell is a 34 y.o. year old female who is a primary care patient of Zola Button, MD .  The Care Management team was consulted for assistance with Mental Health Counseling and Resources.   Interpreter:Yes.   ; Name: Edison Pace # [phone] and Language: Bengali LCSW reached out to [facility] today by phone to introduce self, assess needs and offer Care Management services and interventions.  The outreach was unsuccessful. A HIPPA compliant phone message was left for the patient providing contact information and requesting a return call.   Plan:  Will ask [facility] Care Guide to reach out to patient to see if she is interested in rescheduling the phone appointment.  Review of patient status, including review of consultants reports, relevant laboratory and other test results, and collaboration with appropriate care team members and the patient's provider was performed as part of comprehensive patient evaluation and provision of care management services.    Casimer Lanius, [place] / [place]   [phone] 2:10 PM

## 2020-04-20 ENCOUNTER — Ambulatory Visit: Payer: Medicaid Other | Admitting: Licensed Clinical Social Worker

## 2020-04-20 DIAGNOSIS — F439 Reaction to severe stress, unspecified: Secondary | ICD-10-CM

## 2020-04-21 ENCOUNTER — Ambulatory Visit: Payer: Medicaid Other | Admitting: Registered"

## 2020-04-21 NOTE — Chronic Care Management (AMB) (Signed)
Care Management   Clinical Social Work initial Note  04/21/2020 Name: Cindy Howell MRN: [id number] DOB: 27-Apr-1986 Cindy Howell is a 34 y.o. year old female who sees Cindy Button, MD for primary care. The Care Management team was consulted by Dr. Volanda Napoleon to assist the patient with Mental Health Counseling and Resources to manage stress.  LCSW reached out to [facility] today by phone to introduce self, assess needs and barriers to care.    Assessment: Patient is pleasant and engaged in conversation. Patient would like assistance with locating virtual counseling that will take her insurance.      Plan: LCSW will F/U with patient in 3 to 5 days   Review of patient status, including review of consultants reports, relevant laboratory and other test results, and collaboration with appropriate care team members and the patient's provider was performed as part of comprehensive patient evaluation and provision of chronic care management services.    Advance Directive Status: N See Care Plan and Vynca application for related entries.   Not addressed in this encounter SDOH (Social Determinants of Health) assessments performed: Yes:   SDOH Interventions     Most Recent Value  SDOH Interventions  SDOH Interventions for the Following Domains Stress  Stress Interventions Other (Comment)  [referral for counseling]      ; Goals Addressed            This Visit's Progress   . connect for counseling services       CARE PLAN ENTRY (see longitudinal plan of care for additional care plan information)  Current Barriers:  . Patient acknowledges deficits with connecting to mental health provider for counseling.  . Patient is experiencing symptoms of symptoms of stress which seem to be exacerbated by relationship difficulties, no support system and caring for 2 children under 5.     . Patient needs Support, Education, and Care Coordination in order to meet unmet mental health needs  . Lacks knowledge of  community resources: that provide counseling with an interpreter  Clinical Social Work Goal(s):  Marland Kitchen Over the next 30 to 60 days, patient will connect for ongoing counseling.  Interventions:  . Assessed patient's  previous treatment, needs and barriers to care . Discussed several options for long term counseling based on need and insurance.  Patient would like virtual counseling  . Contacted [facility] [place] [place] - patient will have to provide own interpreter, will also explore [facility] as an options after next encounter with patient.  Patient Self Care Activities & Deficits:  . Patient is unable to independently navigate community resource options without care coordination support . Patient will also contact  insurance list of in-network provider . Patient is motivated for treatment Initial goal documentation        Outpatient Encounter Medications as of 04/20/2020  Medication Sig  . levonorgestrel (MIRENA) 20 MCG/24HR IUD 1 each by Intrauterine route once.  . Multiple Vitamin (MULTIVITAMIN WITH MINERALS) TABS tablet Take 1 tablet by mouth daily.   No facility-administered encounter medications on file as of 04/20/2020.       Information about Care Management services was shared with [name] today including:  1. Care Management services include personalized support from designated clinical staff supervised by her physician, including individualized plan of care and coordination with other care providers 2. Remind patient of 24/7 contact phone numbers to provider's office for assistance with urgent and routine care needs. 3. Care Management services are voluntary and patient  may stop at any time .  Patient agreed to services provided today and verbal consent obtained.     Casimer Lanius, [place] / [place]   [phone] 1:34 PM

## 2020-04-25 ENCOUNTER — Ambulatory Visit: Payer: Medicaid Other | Admitting: Licensed Clinical Social Worker

## 2020-04-25 ENCOUNTER — Other Ambulatory Visit: Payer: Self-pay | Admitting: General Surgery

## 2020-04-25 DIAGNOSIS — F439 Reaction to severe stress, unspecified: Secondary | ICD-10-CM

## 2020-04-25 DIAGNOSIS — Z8659 Personal history of other mental and behavioral disorders: Secondary | ICD-10-CM

## 2020-04-25 DIAGNOSIS — Z1231 Encounter for screening mammogram for malignant neoplasm of breast: Secondary | ICD-10-CM

## 2020-04-25 NOTE — Chronic Care Management (AMB) (Signed)
  Care Management   Clinical Social Work Follow Up   04/25/2020 Name: Cindy Howell MRN: [id number] DOB: 1986-03-20 Referred by: Zola Button, MD  Reason for referral : Care Coordination (connect with counseling)  Cindy Howell is a 34 y.o. year old female who is a primary care patient of Zola Button, MD.  Reason for follow-up: assess for barriers and progress with connecting with counseling.  Assessment: Patient continues to experience symptoms of stress and has not reached out to her insurance provider. See care plan below. Plan:  1. Referral placed to [facility] 2.  LCSW will F/U with referral in 2 weeks. Advance Directive Status: ; not addressed during this encounter.  SDOH (Social Determinants of Health) assessments performed: Yes ;  SDOH Interventions     Most Recent Value  SDOH Interventions  SDOH Interventions for the Following Domains Stress  Stress Interventions Provide Counseling, Other (Comment)  [referral placed for counseling]      Goals Addressed            This Visit's Progress   . connect for counseling services       CARE PLAN ENTRY (see longitudinal plan of care for additional care plan information)  Current Barriers & Progress:  . Patient acknowledges deficits with connecting to mental health provider for counseling.  . Patient is experiencing symptoms of symptoms of stress which seem to be exacerbated by relationship difficulties, no support system and caring for 2 children under 5.     . Patient needs Support, Education, and Care Coordination in order to meet unmet mental health needs  . Lacks knowledge of community resource: that provide counseling with an interpreter  . Patient has not called insurance provider for [employer] counselors Clinical Social Work Goal(s):  Marland Kitchen Over the next 30 to 60 days, patient will connect for ongoing counseling.  Interventions:  . Assessed patient's  barriers to care and progress with calling  insurance provider . Discussed several options for long term counseling based on need and insurance.   . Patient would like LCSW to make referral to [facility]. . Contacted [facility] [place] [place] - patient will have to provide own interpreter . Provided EMMI education ( breathing to relax and leaving Stress)  . Also reviewed relaxed breathing with patient and provided a list of therapy per patient's request . Informed patient to F/U with [facility] if no call in about 2 weeks Patient Self Care Activities & Deficits:  . Patient is unable to independently navigate community resource options without care coordination support . Patient is motivated for treatment Please see past updates related to this goal by clicking on the "Past Updates" button in the selected goal        Outpatient Encounter Medications as of 04/25/2020  Medication Sig  . levonorgestrel (MIRENA) 20 MCG/24HR IUD 1 each by Intrauterine route once.  . Multiple Vitamin (MULTIVITAMIN WITH MINERALS) TABS tablet Take 1 tablet by mouth daily.   No facility-administered encounter medications on file as of 04/25/2020.   Review of patient status, including review of consultants reports, relevant laboratory and other test results, and collaboration with appropriate care team members and the patient's provider was performed as part of comprehensive patient evaluation and provision of care management services.  Casimer Lanius, [place] / [place]   [phone] 10:52 AM

## 2020-05-03 DIAGNOSIS — L298 Other pruritus: Secondary | ICD-10-CM | POA: Diagnosis not present

## 2020-05-03 DIAGNOSIS — L700 Acne vulgaris: Secondary | ICD-10-CM | POA: Diagnosis not present

## 2020-05-10 ENCOUNTER — Ambulatory Visit: Payer: Medicaid Other | Admitting: Licensed Clinical Social Worker

## 2020-05-10 DIAGNOSIS — Z7189 Other specified counseling: Secondary | ICD-10-CM

## 2020-05-10 NOTE — Chronic Care Management (AMB) (Signed)
  Care Management   Clinical Social Work Follow Up   05/10/2020 Name: Cindy Howell MRN: [id number] DOB: November 18, 1985 Referred by: Zola Button, MD  Reason for referral : Care Coordination (connect for counseling)  Cindy Howell is a 34 y.o. year old female who is a primary care patient of Zola Button, MD.  Reason for follow-up: assess for barriers and progress with connecting for counseling .   Assessment: Patient continues to experience barriers with connecting for ongoing counseling. See care plan below Plan:  1. Message sent to [facility] 2.  LCSW will F/U in one week Advance Directive Status:; not addressed during this encounter.  SDOH (Social Determinants of Health) assessments performed: ; No needs identified   Goals Addressed            This Visit's Progress   . connect for counseling services   Not on track    [place] (see longitudinal plan of care for additional care plan information)  Current Barriers & Progress:  . Patient acknowledges deficits with connecting to mental health provider for counseling.  . Patient is experiencing symptoms of symptoms of stress which seem to be exacerbated by relationship difficulties, no support system and caring for 2 children under 5.     . Patient needs Support, Education, and Care Coordination in order to meet unmet mental health needs  . Lacks knowledge of community resource: that provide counseling with an interpreter  . Patient has not called insurance provider for [employer] . patient did not F/U with [place] Work Goal(s):  Marland Kitchen Over the next 30 to 60 days, patient will connect for ongoing counseling.  Interventions:  . Assessed patient's  barriers to care and progress  . Discussed several options for long term counseling based on need and insurance.   . F/U on referral to [facility]. [facility] outreach to patient on 05/02/20) . Call to [facility] with patient on  line to schedule appointment. (Phone disconnected) . Collaborated with [facility] via in-basket message requesting to call patient to schedule appointment. . Provided EMMI education ( breathing to relax and leaving Stress)  . Also reviewed relaxed breathing with patient and provided a list of therapy per patient's request . Informed  Patient Self Care Activities & Deficits:  . Patient is unable to independently navigate community resource options without care coordination support . Patient is motivated for treatment however has not F/U Please see past updates related to this goal by clicking on the "Past Updates" button in the selected goal         Outpatient Encounter Medications as of 05/10/2020  Medication Sig  . levonorgestrel (MIRENA) 20 MCG/24HR IUD 1 each by Intrauterine route once.  . Multiple Vitamin (MULTIVITAMIN WITH MINERALS) TABS tablet Take 1 tablet by mouth daily.   No facility-administered encounter medications on file as of 05/10/2020.   Review of patient status, including review of consultants reports, relevant laboratory and other test results, and collaboration with appropriate care team members and the patient's provider was performed as part of comprehensive patient evaluation and provision of care management services.    Casimer Lanius, [place] / [place]   [phone] 12:31 PM

## 2020-05-11 ENCOUNTER — Other Ambulatory Visit: Payer: Self-pay

## 2020-05-11 ENCOUNTER — Ambulatory Visit
Admission: RE | Admit: 2020-05-11 | Discharge: 2020-05-11 | Disposition: A | Discharge status: Home / Self Care | Payer: Medicaid Other | Source: Ambulatory Visit | Attending: General Surgery | Admitting: General Surgery

## 2020-05-11 DIAGNOSIS — Z1231 Encounter for screening mammogram for malignant neoplasm of breast: Secondary | ICD-10-CM | POA: Diagnosis not present

## 2020-05-13 ENCOUNTER — Ambulatory Visit (HOSPITAL_COMMUNITY): Payer: Self-pay

## 2020-05-15 ENCOUNTER — Ambulatory Visit: Payer: Medicaid Other

## 2020-05-16 DIAGNOSIS — Z113 Encounter for screening for infections with a predominantly sexual mode of transmission: Secondary | ICD-10-CM | POA: Diagnosis not present

## 2020-05-16 DIAGNOSIS — R350 Frequency of micturition: Secondary | ICD-10-CM | POA: Diagnosis not present

## 2020-05-16 DIAGNOSIS — R102 Pelvic and perineal pain: Secondary | ICD-10-CM | POA: Diagnosis not present

## 2020-05-16 DIAGNOSIS — N8302 Follicular cyst of left ovary: Secondary | ICD-10-CM | POA: Diagnosis not present

## 2020-05-16 DIAGNOSIS — L299 Pruritus, unspecified: Secondary | ICD-10-CM | POA: Diagnosis not present

## 2020-05-19 ENCOUNTER — Ambulatory Visit: Payer: Medicaid Other | Admitting: Licensed Clinical Social Worker

## 2020-05-19 DIAGNOSIS — F439 Reaction to severe stress, unspecified: Secondary | ICD-10-CM

## 2020-05-19 DIAGNOSIS — Z8659 Personal history of other mental and behavioral disorders: Secondary | ICD-10-CM

## 2020-05-19 NOTE — Chronic Care Management (AMB) (Signed)
Care Management   Clinical Social Work Follow Up   05/19/2020 Name: Cindy Howell MRN: [id number] DOB: 1985-12-06 Referred by: Zola Button, MD  Reason for referral : Care Coordination (counseling )  Cindy Howell is a 34 y.o. year old female who is a primary care patient of Zola Button, MD.  Reason for follow-up: assess for barriers and progress with care plan .   Assessment: Patient continues to experience difficulty with connecting to counseling that will meet her needs. States she now wants couples counseling.  Difficulty with locating a provider base on needs and personal requirements of patient. See care plan below. Plan:  1. Referral placed.  Also informed patient she could do a walkin at [employer] 2.   LCSW will F/U in 1 to 2 weeks Advance Directive Status: ; not addressed during this encounter.  SDOH (Social Determinants of Health) assessments performed: Yes ;  SDOH Interventions     Most Recent Value  SDOH Interventions  SDOH Interventions for the Following Domains Depression  Stress Interventions Other (Comment)  [[facility] referral]      Goals Addressed            This Visit's Progress   . connect for counseling services       CARE PLAN ENTRY (see longitudinal plan of care for additional care plan information)  Current Barriers & Progress:  . Patient acknowledges deficits with connecting to mental health provider for counseling.  . Patient is experiencing symptoms of symptoms of stress which seem to be exacerbated by relationship difficulties, no support system and caring for 2 children under 5.     . Patient needs Support, Education, and Care Coordination in order to meet unmet mental health needs  . Lacks knowledge of community resource: that provide counseling with an interpreter  . Patient called insurance provider for [employer] counselors but has not been able to speak to anyone . patient received call from [facility] but states she  no longer wants indiv. Counseling .  She wants family/ couples counseling Clinical Social Work Goal(s):  Marland Kitchen Over the next 30 to 60 days, patient will connect for ongoing counseling.  Interventions:  . Assessed patient's  barriers to care and progress  . Discussed several options for long term counseling based on need and insurance. Patient has several options for counseling but only wants counseling with an interpreter. (called many agencies that will not provide interpreter. Patient would like family Services    . [place] with patient on line to schedule appointment.  Marland Kitchen [facility]  referral placed to family services to [facility] Patient Self Care Activities & Deficits:  . Patient is unable to independently navigate community resource options without care coordination support . Patient is motivated for treatment  Please see past updates related to this goal by clicking on the "Past Updates" button in the selected goal        Outpatient Encounter Medications as of 05/19/2020  Medication Sig  . levonorgestrel (MIRENA) 20 MCG/24HR IUD 1 each by Intrauterine route once.  . Multiple Vitamin (MULTIVITAMIN WITH MINERALS) TABS tablet Take 1 tablet by mouth daily.   No facility-administered encounter medications on file as of 05/19/2020.   Review of patient status, including review of consultants reports, relevant laboratory and other test results, and collaboration with appropriate care team members and the patient's provider was performed as part of comprehensive patient evaluation and provision of care management services.   Casimer Lanius,  [place] / [place]   [phone] 3:46 PM

## 2020-05-21 ENCOUNTER — Ambulatory Visit (HOSPITAL_COMMUNITY)
Admission: EM | Admit: 2020-05-21 | Discharge: 2020-05-21 | Discharge status: Against Medical Advice | Payer: Medicaid Other

## 2020-05-21 ENCOUNTER — Other Ambulatory Visit: Payer: Self-pay

## 2020-05-22 ENCOUNTER — Ambulatory Visit (HOSPITAL_COMMUNITY): Payer: Self-pay

## 2020-05-24 ENCOUNTER — Ambulatory Visit (HOSPITAL_COMMUNITY)
Admission: EM | Admit: 2020-05-24 | Discharge: 2020-05-24 | Disposition: A | Discharge status: Home / Self Care | Payer: Medicaid Other | Attending: Family Medicine | Admitting: Family Medicine

## 2020-05-24 ENCOUNTER — Encounter: Payer: Self-pay | Admitting: Dietician

## 2020-05-24 ENCOUNTER — Encounter: Payer: Medicaid Other | Attending: Family Medicine | Admitting: Dietician

## 2020-05-24 ENCOUNTER — Ambulatory Visit: Admission: EM | Admit: 2020-05-24 | Discharge status: Against Medical Advice | Payer: Medicaid Other

## 2020-05-24 ENCOUNTER — Other Ambulatory Visit: Payer: Self-pay

## 2020-05-24 ENCOUNTER — Encounter (HOSPITAL_COMMUNITY): Payer: Self-pay | Admitting: Emergency Medicine

## 2020-05-24 DIAGNOSIS — M62830 Muscle spasm of back: Secondary | ICD-10-CM

## 2020-05-24 DIAGNOSIS — R7303 Prediabetes: Secondary | ICD-10-CM | POA: Insufficient documentation

## 2020-05-24 MED ORDER — CYCLOBENZAPRINE HCL 10 MG PO TABS
ORAL_TABLET | ORAL | 0 refills | Status: DC
Start: 2020-05-24 — End: 2020-05-29

## 2020-05-24 MED ORDER — IBUPROFEN 800 MG PO TABS
800.0000 mg | ORAL_TABLET | Freq: Three times a day (TID) | ORAL | 0 refills | Status: DC
Start: 2020-05-24 — End: 2020-05-29

## 2020-05-24 NOTE — Patient Instructions (Signed)
Remember your goals from today:   Try to walk at least 4 days per week, for 15 to 30 minutes each day.   Do not have more than 2 slices of bread at breakfast.   Do not have more than 2 cups of rice with lunch. Instead, try to eat more vegetables and protein.   Look up how to bake chicken and try it out!

## 2020-05-24 NOTE — Progress Notes (Signed)
Medical Nutrition Therapy   Primary concerns today: prediabetes and weight maintenance   Referral diagnosis: R63.5- weight gain Preferred learning style: no preference indicated Learning readiness: ready   NUTRITION ASSESSMENT   Anthropometrics  Weight: 200 lbs    Clinical Medical Hx: obesity, prediabetes, gestational diabetes Medications: see chart Labs: A1c 6.4% (04/12/20) Notable Signs/Symptoms: shoulder/back pain  Lifestyle & Dietary Hx Patient states she is concerned about her A1c and asked if she has diabetes. We reviewed what the A1c is/means and explained she is currently in the prediabetes range. States that she has gained about 30 pounds over the past year. States that she has a large appetite in the morning, and isn't always hungry for dinner in the evening. Typical meal pattern is 2-3 meals plus 1 snack per day. States she uses stevia for her tea. Sometimes eats fruit and yogurt for snack. Eats a lot of white basmati rice, about 7-9 cups per day between lunch and dinner meals. States that fish and meats are usually fried.   Current average weekly physical activity: N/A  24-Hr Dietary Recall First Meal: 4 slices bread + 2 eggs + 1 cup tea w/ stevia & milk  Snack: - Second Meal: 5 cups rice + fish/meat + vegetables  Snack: cookies/chips + 1 cup tea w/ stevia & milk  Third Meal: 1-2 cups rice + fish + vegetables  Snack: - Beverages: hot tea, water, zero-calorie flavored electrolyte water   Estimated Energy Needs Calories: 1800 Carbohydrate: 200g Protein: 113g Fat: 60g   NUTRITION DIAGNOSIS  Excessive carbohydrate intake (NI-5.8.2) related to food/nutrition related knowledge deficit as evidenced by A1c level of 6.4%, diet history, and patient reported intake of high carbohydrate foods greater than estimated needs.     NUTRITION INTERVENTION  Nutrition education (E-1) on the following topics:  . Prediabetes . Balanced, healthful eating   Handouts Provided  Include   Types of Diabetes and BG Control   MyPlate  Meal Ideas   Learning Style & Readiness for Change Teaching method utilized: Visual & Auditory  Demonstrated degree of understanding via: Teach Back  Barriers to learning/adherence to lifestyle change: None Identified   Goals Established by Pt  Try to walk at least 4 days per week, for 15 to 30 minutes each day.   Do not have more than 2 slices of bread at breakfast.   Do not have more than 2 cups of rice with lunch. Instead, try to eat more vegetables and protein.   Look up how to bake chicken and try it out!   MONITORING & EVALUATION Dietary intake, weekly physical activity, and goals in 5-6 weeks.  Next Steps  Patient is to return to [facility] for follow up visit in about 5-6 weeks.

## 2020-05-24 NOTE — ED Triage Notes (Signed)
Pt presents with shoulder pain xs 5 days. States left shoulder is worse. Denies fall or injury, states woke up with shoulder pain. States is worse in the AM, unable to raise arm to brush hair.  Has taken ibuprofen 600mg  and tylenol with some relief.

## 2020-05-24 NOTE — Progress Notes (Deleted)
    SUBJECTIVE:   CHIEF COMPLAINT / HPI:   Shoulder pain:   PERTINENT  PMH / PSH: ***  OBJECTIVE:   LMP 05/10/2020   ***  ASSESSMENT/PLAN:   No problem-specific Assessment & Plan notes found for this encounter.     Benay Pike, MD [place]

## 2020-05-24 NOTE — ED Provider Notes (Signed)
Cindy Howell   [id number] 05/24/20 Arrival Time: [place] PLAN:  1. Muscle spasm of back     Able to ambulate here and hemodynamically stable. No indication for imaging of back at this time given no trauma and normal neurological exam. Discussed.   Meds ordered this encounter  Medications   cyclobenzaprine (FLEXERIL) 10 MG tablet    Sig: Take 1 tablet by mouth 3 times daily as needed for muscle spasm. Warning: May cause drowsiness.    Dispense:  21 tablet    Refill:  0   ibuprofen (ADVIL) 800 MG tablet    Sig: Take 1 tablet (800 mg total) by mouth 3 (three) times daily with meals.    Dispense:  21 tablet    Refill:  0    Medication sedation precautions given. Encourage ROM/movement as tolerated.  Recommend:  Follow-up Information    Zola Button, MD.   Specialty: Family Medicine Why: If worsening or failing to improve as anticipated. Contact information: [place] [address] [phone]               Reviewed expectations re: course of current medical issues. Questions answered. Outlined signs and symptoms indicating need for more acute intervention. Patient verbalized understanding. After Visit Summary given.   SUBJECTIVE: History from: patient.  Cindy Howell is a 34 y.o. female who presents with complaint of bilateral upper back discomfort; h/o similar in the past; current pain 4-5 days; no injury/trauma. Lifting arms worsens pain. No specific neck pain. No extremity sensation changes or weakness. Ibuprofen and tylenol helps some. Requests muscle relaxer. PT has helped in the past.    OBJECTIVE:  Vitals:   05/24/20 1227  BP: 132/87  Pulse: 78  Resp: 17  Temp: 98.6 F (37 C)  TempSrc: Oral  SpO2: 98%    General appearance: alert; no distress HEENT: [place]; AT Neck: supple with FROM; without midline tenderness Lungs: unlabored respirations; speaks full sentences without difficulty Abdomen: soft, non-tender;  non-distended Back: moderate tenderness of bilateral upper back pain over trapezius distribution; FROM at waist; without midline tenderness Skin: warm and dry Neurologic: normal gait; normal sensation and strength of bilateral UE Psychological: alert and cooperative; normal mood and affect  Allergies  Allergen Reactions   Beef-Derived Products Rash   Goat-Derived Products Rash   Shrimp [Shellfish Allergy] Rash    Past Medical History:  Diagnosis Date   Allergy    goat meat, pollen   Gestational diabetes    Pre-diabetes    Urticaria    Social History   Socioeconomic History   Marital status: Married    Spouse name: Not on file   Number of children: Not on file   Years of education: Not on file   Highest education level: Not on file  Occupational History   Not on file  Tobacco Use   Smoking status: Never Smoker   Smokeless tobacco: Never Used  Vaping Use   Vaping Use: Never used  Substance and Sexual Activity   Alcohol use: Never   Drug use: Never   Sexual activity: Yes    Birth control/protection: None, I.U.D.  Other Topics Concern   Not on file  Social History Narrative   Not on file   Social Determinants of Health   Financial Resource Strain:    Difficulty of Paying Living Expenses: Not on file  Food Insecurity: No Food Insecurity   Worried About [place] in the Last Year: Never  true   Ran Out of Food in the Last Year: Never true  Transportation Needs:    Lack of Transportation (Medical): Not on file   Lack of Transportation (Non-Medical): Not on file  Physical Activity:    Days of Exercise per Week: Not on file   Minutes of Exercise per Session: Not on file  Stress:    Feeling of Stress : Not on file  Social Connections:    Frequency of Communication with Friends and Family: Not on file   Frequency of Social Gatherings with Friends and Family: Not on file   Attends Religious Services: Not on file   Active  Member of Clubs or Organizations: Not on file   Attends Archivist Meetings: Not on file   Marital Status: Not on file  Intimate Partner Violence:    Fear of Current or Ex-Partner: Not on file   Emotionally Abused: Not on file   Physically Abused: Not on file   Sexually Abused: Not on file   Family History  Problem Relation Age of Onset   Diabetes Mother    Heart disease Mother    Diabetes Father    Heart disease Father    Kidney disease Father    Kidney disease Brother    Past Surgical History:  Procedure Laterality Date   BREAST LUMPECTOMY WITH RADIOACTIVE SEED LOCALIZATION Left 07/28/2019   Procedure: LEFT BREAST LUMPECTOMY WITH RADIOACTIVE SEED LOCALIZATION;  Surgeon: Jovita Kussmaul, MD;  Location: [place];  Service: General;  Laterality: Left;   WISDOM TOOTH EXTRACTION       Cindy Kick, MD 05/24/20 1315

## 2020-05-25 ENCOUNTER — Ambulatory Visit: Payer: Self-pay

## 2020-05-25 ENCOUNTER — Ambulatory Visit: Payer: Medicaid Other

## 2020-05-25 ENCOUNTER — Telehealth: Payer: Self-pay | Admitting: Family Medicine

## 2020-05-26 ENCOUNTER — Ambulatory Visit: Payer: Medicaid Other | Admitting: Licensed Clinical Social Worker

## 2020-05-26 DIAGNOSIS — F439 Reaction to severe stress, unspecified: Secondary | ICD-10-CM

## 2020-05-26 NOTE — Chronic Care Management (AMB) (Signed)
Care Management   Clinical Social Work Follow Up   05/26/2020 Name: Kiely Cousar MRN: [id number] DOB: 08/29/1986 Referred by: Zola Button, MD  Reason for referral : Care Coordination (counseling resoursces)  Wanell Lorenzi is a 34 y.o. year old female who is a primary care patient of Zola Button, MD.  Reason for follow-up: assess for barriers and progress with care plan .   Assessment: Patient continues to experience barriers with connecting with counseling based on need and insurance. See care plan below  Plan:  1. Patient will call [facility] 2. LCSW will F/U in two weeks  Advance Directive Status: not addressed during this encounter.  SDOH (Social Determinants of Health) assessments performed:  No new needs identified   Goals Addressed            This Visit's Progress    connect for counseling services   Not on track    [place] (see longitudinal plan of care for additional care plan information)  Current Barriers & Progress:   Patient acknowledges deficits with connecting to mental health provider for counseling.   Patient is experiencing symptoms of symptoms of stress which seem to be exacerbated by relationship difficulties, no support system and caring for 2 children under 5.      Patient needs Support, Education, and Care Coordination in order to meet unmet mental health needs   Lacks knowledge of community resource: that provide counseling with an interpreter   Patient called insurance provider for [employer] counselors but has not been able to speak to anyone  patient received call from [facility] but states she no longer wants indiv. Counseling .  She wants family/ couples counseling  Patient has not received a call from [facility] nor has she gone to the walk-in clinic [facility]  referral placed to family services to [facility] is still pending Clinical Social Work Goal(s):   Over the next 30 to 60  days, patient will connect for ongoing counseling.  Interventions:   Assessed patient's  barriers to care and progress   Discussed several options for long term counseling based on need and insurance. Patient has several options for counseling but only wants counseling with an interpreter. (called many agencies that will not provide interpreter.   Called [facility] they will take patient's  insurance and meet her needs.  Provided patient with contact number to schedule the appointment [facility]:  [phone] [address] [place] Patient Self Care Activities & Deficits:   Patient is unable to independently navigate community resource options without care coordination support  Patient is motivated for treatment  Please see past updates related to this goal by clicking on the "Past Updates" button in the selected goal        Outpatient Encounter Medications as of 05/26/2020  Medication Sig   cyclobenzaprine (FLEXERIL) 10 MG tablet Take 1 tablet by mouth 3 times daily as needed for muscle spasm. Warning: May cause drowsiness.   ibuprofen (ADVIL) 800 MG tablet Take 1 tablet (800 mg total) by mouth 3 (three) times daily with meals.   levonorgestrel (MIRENA) 20 MCG/24HR IUD 1 each by Intrauterine route once.   Multiple Vitamin (MULTIVITAMIN WITH MINERALS) TABS tablet Take 1 tablet by mouth daily.   No facility-administered encounter medications on file as of 05/26/2020.   Review of patient status, including review of consultants reports, relevant laboratory and other test results, and collaboration with appropriate care team members and  the patient's provider was performed as part of comprehensive patient evaluation and provision of care management services.    Casimer Lanius, [place] / [place]   [phone] 9:54 AM

## 2020-05-29 ENCOUNTER — Other Ambulatory Visit: Payer: Self-pay

## 2020-05-29 ENCOUNTER — Ambulatory Visit (INDEPENDENT_AMBULATORY_CARE_PROVIDER_SITE_OTHER): Payer: Medicaid Other | Admitting: Family Medicine

## 2020-05-29 VITALS — BP 126/80 | HR 92 | Ht 64.0 in | Wt 200.8 lb

## 2020-05-29 DIAGNOSIS — S46812A Strain of other muscles, fascia and tendons at shoulder and upper arm level, left arm, initial encounter: Secondary | ICD-10-CM | POA: Diagnosis not present

## 2020-05-29 DIAGNOSIS — M25512 Pain in left shoulder: Secondary | ICD-10-CM | POA: Diagnosis not present

## 2020-05-29 MED ORDER — BACLOFEN 10 MG PO TABS: 10.0000 mg | ORAL_TABLET | Freq: Three times a day (TID) | ORAL | 0 refills | Status: DC | PRN

## 2020-05-29 MED ORDER — IBUPROFEN 800 MG PO TABS: 800.0000 mg | ORAL_TABLET | Freq: Three times a day (TID) | ORAL | 0 refills | Status: DC | PRN

## 2020-05-29 NOTE — Progress Notes (Signed)
    SUBJECTIVE:   CHIEF COMPLAINT / HPI: "shoulder pain"   Cindy Howell is a 34 year old female presenting discussed the following:  Shoulder pain: Left-sided more on her upper back, present for the proximally the last 10 days.  Recently did more heavy lifting, carried 30 pack of water bottles and is felt a lot of tension since that time.  Hurts the worst in the morning and at bedtime, worse with moving her head towards the left.  Denies any associated numbness/tingling, muscle weakness, shoulder clicking/instability.  Had similar problem on the right in 2020 and completed physical therapy with significant improvement.  Went to UC for this and prescribed a muscle relaxer, helped a lot however the medication makes her too drowsy.  PERTINENT  PMH / PSH: History of gestational diabetes and preeclampsia, GERD, allergic rhinitis, TMJ, acne  OBJECTIVE:   BP 126/80   Pulse 92   Ht 5\' 4"  (1.626 m)   Wt 200 lb 12.8 oz (91.1 kg)   LMP 05/10/2020   SpO2 97%   BMI 34.47 kg/m   General: Alert, NAD HEENT: NCAT, MMM Lungs: No increased WOB  Ext: Warm, dry, 2+ radial pulses bilaterally Msk:  Right Shoulder: Inspection reveals no obvious deformity, atrophy, or asymmetry. No bruising. No swelling Palpation is normal with no TTP over [facility] joint or bicipital groove bilaterally. TTP of left trapezius with notable muscular tonicity in comparison to right  Full ROM in flexion, abduction, internal/external rotation b/l NV intact distally Normal scapular function observed. Special Tests:  - Impingement: Neg neers - Supraspinatous: Negative empty can.  5/5 strength with resisted flexion at 20 degrees - Infraspinatous/Teres Minor: 5/5 strength with ER - Subscapularis: 5/5 strength with IR - Biceps tendon: Negative Speeds - Labrum: Negative clunk, good stability observed - No painful arc and no drop arm sign  ASSESSMENT/PLAN:   Trapezius muscle strain, left, initial encounter Acute, improving.   Reassuringly normal shoulder exam and preserved strength, however significantly  tender with trapezius palpation only.  Shown brief stretches to do at home and recommended Tylenol/ibuprofen, heat, and muscle relaxer.  Rx baclofen for less sedating effect.  Will refer to PT per patient request.    Follow-up if the above not improving in the next 4 weeks or sooner if worsening including any paresthesias, weakness, or instability  [place]

## 2020-05-29 NOTE — Patient Instructions (Signed)
It is wonderful to see you today.  I am so sorry that your shoulder has continued to bother you.  I have sent in a less drowsy muscle relaxer for you to try, you can try half tablet first and try it at home to see how you do.  It is possible this can still make you a little drowsy so be careful with this medication.  You can continue to take Tylenol and/or ibuprofen as needed for discomfort.  Try heat 20 minutes at a time several times a day.  Lastly I placed a referral to physical therapy the you should hear from in the next 1-2 weeks, if not please call our office.  Follow-up if you have any associated arm numbness, weakness, or not getting better in the next few weeks.

## 2020-06-01 ENCOUNTER — Encounter: Payer: Self-pay | Admitting: Family Medicine

## 2020-06-01 DIAGNOSIS — S46812A Strain of other muscles, fascia and tendons at shoulder and upper arm level, left arm, initial encounter: Secondary | ICD-10-CM | POA: Insufficient documentation

## 2020-06-01 NOTE — Assessment & Plan Note (Signed)
Acute, improving.  Reassuringly normal shoulder exam and preserved strength, however significantly  tender with trapezius palpation only.  Shown brief stretches to do at home and recommended Tylenol/ibuprofen, heat, and muscle relaxer.  Rx baclofen for less sedating effect.  Will refer to PT per patient request.

## 2020-06-09 ENCOUNTER — Ambulatory Visit: Payer: Medicaid Other | Admitting: Licensed Clinical Social Worker

## 2020-06-09 DIAGNOSIS — Z7189 Other specified counseling: Secondary | ICD-10-CM

## 2020-06-09 DIAGNOSIS — F439 Reaction to severe stress, unspecified: Secondary | ICD-10-CM

## 2020-06-09 NOTE — Chronic Care Management (AMB) (Signed)
Care Management   Clinical Social Work Follow Up   06/09/2020 Name: Cindy Howell MRN: [id number] DOB: 1986-08-23 Referred by: Zola Button, MD  Reason for referral : Care Coordination (F/U)  Cindy Howell is a 34 y.o. year old female who is a primary care patient of Zola Button, MD.  Reason for follow-up: assess for barriers and progress with care plan .   Assessment: Patient continues to experience barriers with getting initial appointment scheduled for couples counseling. See care plan below for details Plan:  1. LCSW will F/U with patient in two weeks to assess barriers and offer support  SDOH (Social Determinants of Health) assessments performed:No new needs identified   Goals Addressed            This Visit's Progress   . connect for counseling services   On track    [place] (see longitudinal plan of care for additional care plan information)  Current Barriers & Progress:  . Patient acknowledges deficits with connecting to mental health provider for counseling.  . Patient is experiencing symptoms of symptoms of stress which seem to be exacerbated by relationship difficulties, no support system and caring for 2 children under 5.     . Patient needs Support, Education, and Care Coordination in order to meet unmet mental health needs  . Lacks knowledge of community resource: that provide counseling with an interpreter  . Patient called insurance provider for [employer] counselors but has not been able to speak to anyone . Patient contacted [facility], has completed intake packet and plans to return information today. Clinical Social Work [employer]):  Marland Kitchen Over the next 30 to 60 days, patient will connect for ongoing counseling.  Interventions:  . Assessed patient's barriers to care and progress, how coping ( patient is doing morning walks)  . Discussed several options for long term counseling based on need and insurance. Patient has several options for counseling but  only wants counseling with an interpreter. (called many agencies that will not provide interpreter.  [facility] they will take patient's  insurance and meet her needs.  Provided patient with contact number to schedule the appointment [facility]:  [phone] [place] . Provided patient with other therapy options. [facility])  Patient will call if [facility] does not work out. Patient Self Care Activities & Deficits:  . Patient is unable to independently navigate community resource options without care coordination support . Patient is motivated for treatment  Please see past updates related to this goal by clicking on the "Past Updates" button in the selected goal        Outpatient Encounter Medications as of 06/09/2020  Medication Sig  . baclofen (LIORESAL) 10 MG tablet Take 1 tablet (10 mg total) by mouth 3 (three) times daily as needed for muscle spasms. Try half tablet first  . ibuprofen (ADVIL) 800 MG tablet Take 1 tablet (800 mg total) by mouth 3 (three) times daily with meals as needed.  Marland Kitchen levonorgestrel (MIRENA) 20 MCG/24HR IUD 1 each by Intrauterine route once.  . Multiple Vitamin (MULTIVITAMIN WITH MINERALS) TABS tablet Take 1 tablet by mouth daily.   No facility-administered encounter medications on file as of 06/09/2020.   Review of patient status, including review of consultants reports, relevant laboratory and other test results, and collaboration with appropriate care team members and the patient's provider was performed as part of comprehensive patient evaluation and provision of care management services.    Neoma Laming  [name], [place] / [place]   [phone] 10:59 AM

## 2020-06-14 ENCOUNTER — Other Ambulatory Visit: Payer: Self-pay

## 2020-06-14 ENCOUNTER — Ambulatory Visit (INDEPENDENT_AMBULATORY_CARE_PROVIDER_SITE_OTHER): Payer: Medicaid Other

## 2020-06-14 DIAGNOSIS — Z23 Encounter for immunization: Secondary | ICD-10-CM

## 2020-06-14 NOTE — Progress Notes (Signed)
Patient presents in nurse clinic for Flu Vaccine.   Vaccine administered LD without complication.   See admin for details.

## 2020-06-20 ENCOUNTER — Ambulatory Visit (INDEPENDENT_AMBULATORY_CARE_PROVIDER_SITE_OTHER): Payer: Medicaid Other | Admitting: Family Medicine

## 2020-06-20 DIAGNOSIS — Z5329 Procedure and treatment not carried out because of patient's decision for other reasons: Secondary | ICD-10-CM

## 2020-06-20 DIAGNOSIS — N644 Mastodynia: Secondary | ICD-10-CM | POA: Insufficient documentation

## 2020-06-20 NOTE — Progress Notes (Signed)
Patient was a no-show to her appt. Is rescheduled for today 06/22/2020.   Milus Banister, [place], PGY-3 06/22/2020 8:26 AM

## 2020-06-21 NOTE — Progress Notes (Signed)
  Date of Visit: 06/22/2020   SUBJECTIVE:   HPI:  Cindy Howell presents today for pain in L chest.  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 Recently had normal mammogram on 8/26.  Describes pain in her upper chest above the breast. Occurs intermittently ever since her breast surgery. Has not tried anything for the pain. Does occur sometimes with walking, but other times not. It is primarily present when she is moving her arms/lifting objects. Happens about twice a week for 3-4 hours at a time. Denies associated shortness of breath, nausea, sweating, or radiation elsewhere. She is a nonsmoker. She is mainly concerned about whether this pain could be from her heart. Dad had heart attack at age 27-50. She is not currently in pain. When the pain occurs it's not bad enough to even want to take medicine, she just notices it.  GERD - takes omeprazole 40mg  daily. This helps control her symptoms. Needs refill of it.  OBJECTIVE:   BP 122/80   Pulse (!) 102   Wt 199 lb 9.6 oz (90.5 kg)   SpO2 99%   BMI 34.26 kg/m  Gen: no acute distress, pleasant, cooperative, well appearing HEENT: normocephalic, atraumatic  Heart: regular rate and rhythm, no murmur. Chest wall nontender to palpation Lungs: clear to auscultation bilaterally, normal work of breathing  Neuro: alert, speech normal Breasts: bilateral breasts normal in appearance. No erythema, deformity, or nipple discharge. No palpable abnormal masses. No axillary lymphadenopathy. Chaperone [place] present for breast exam.  ASSESSMENT/PLAN:   Health maintenance:  -reports had pap when pregnant at [facility] on 11/24/18, located results via Care Everywhere and documented in health maintenance, can repeat pap in 2025 -given COVID booster dose today (second dose of Pfizer on 4/5, BMI qualifies her for booster)  L chest pain Description of pain is atypical for angina/cardiac cause. I was not able to reproduce the  pain on palpation today. Breast exam is normal. As patient was primarily concerned about cardiac cause, EKG was done today which showed no signs of ischemia (does show TWI in v1-v2 which can be normal). No prior EKG to compare. Overall very low suspicion for cardiac etiology. Recommend NSAIDs or tylenol to relieve pain. Patient agreeable and appreciative.  GERD (gastroesophageal reflux disease) Overall well controlled on omeprazole 40mg  daily Recommend trial of H2 blocker in lieu of chronic PPI Sent in rx for pepcid. Patient will contact us if this does not help her symptoms, and we can restart omeprazole.  FOLLOW UP: Follow up as needed if symptoms worsen or do not improve.   [place]. Ardelia Mems, [place]

## 2020-06-22 ENCOUNTER — Other Ambulatory Visit: Payer: Self-pay

## 2020-06-22 ENCOUNTER — Ambulatory Visit: Payer: Medicaid Other | Admitting: Licensed Clinical Social Worker

## 2020-06-22 ENCOUNTER — Encounter: Payer: Self-pay | Admitting: Family Medicine

## 2020-06-22 ENCOUNTER — Ambulatory Visit (HOSPITAL_COMMUNITY)
Admission: RE | Admit: 2020-06-22 | Discharge: 2020-06-22 | Disposition: A | Discharge status: Home / Self Care | Payer: Medicaid Other | Source: Ambulatory Visit | Attending: Family Medicine | Admitting: Family Medicine

## 2020-06-22 ENCOUNTER — Ambulatory Visit: Payer: Medicaid Other | Admitting: Family Medicine

## 2020-06-22 VITALS — BP 122/80 | HR 102 | Wt 199.6 lb

## 2020-06-22 DIAGNOSIS — K219 Gastro-esophageal reflux disease without esophagitis: Secondary | ICD-10-CM | POA: Diagnosis not present

## 2020-06-22 DIAGNOSIS — R079 Chest pain, unspecified: Secondary | ICD-10-CM | POA: Insufficient documentation

## 2020-06-22 DIAGNOSIS — F439 Reaction to severe stress, unspecified: Secondary | ICD-10-CM

## 2020-06-22 DIAGNOSIS — Z23 Encounter for immunization: Secondary | ICD-10-CM | POA: Diagnosis not present

## 2020-06-22 MED ORDER — FAMOTIDINE 20 MG PO TABS: 20.0000 mg | ORAL_TABLET | Freq: Every day | ORAL | 1 refills | Status: DC

## 2020-06-22 NOTE — Chronic Care Management (AMB) (Signed)
  Care Management   Clinical Social Work Follow Up   06/22/2020 Name: Cindy Howell MRN: [id number] DOB: [birth year] Referred by: Zola Button, MD  Reason for referral : Care Coordination (connect for counseling)  Cindy Howell is a 34 y.o. year old female who is a primary care patient of Zola Button, MD.  Reason for follow-up: assess for barriers and progress with connecting for couples counseling .    Assessment: Patient continues to experience barriers connecting to resources provided for counseling. Continues to experience difficulty with managing life stressors and  communication with husband Interventions:  -active listening utilized - participation in counseling encouraged ( assisted patient with calling [facility] while in the office; formal referral placed) - problem-solving facilitated Plan: Patient would like continued follow-up from CCM LCSW.  LCSW will F/U with patient in 1 to 2 weeks  Advance Directive Status: not addressed during this encounter.  SDOH (Social Determinants of Health) assessments performed:No needs identified   Goals Addressed            This Visit's Progress              . Find Help in My Community/ counseling services       Follow Up Date 07/04/2020    - follow-up on referral from [facility] if you do not receive a call   Why is this important?   Knowing how and where to find help for yourself or family in your neighborhood and community is an important skill.  Congratulations on making this step.     Notes:  [place]  [address] [place] [address] [phone]        Outpatient Encounter Medications as of 06/22/2020  Medication Sig  . famotidine (PEPCID) 20 MG tablet Take 1 tablet (20 mg total) by mouth daily.  Marland Kitchen levonorgestrel (MIRENA) 20 MCG/24HR IUD 1 each by Intrauterine route once.  . Multiple Vitamin (MULTIVITAMIN WITH MINERALS) TABS tablet Take 1 tablet by mouth daily.   No  facility-administered encounter medications on file as of 06/22/2020.   Review of patient status, including review of consultants reports, relevant laboratory and other test results, and collaboration with appropriate care team members and the patient's provider was performed as part of comprehensive patient evaluation and provision of care management services.    Casimer Lanius, [place] / [place]   [phone] 11:10 AM

## 2020-06-22 NOTE — Assessment & Plan Note (Addendum)
Overall well controlled on omeprazole 40mg  daily Recommend trial of H2 blocker in lieu of chronic PPI Sent in rx for pepcid. Patient will contact us if this does not help her symptoms, and we can restart omeprazole.

## 2020-06-22 NOTE — Progress Notes (Signed)
   Covid-19 Vaccination Clinic  Name:  Cindy Howell    MRN: [id number] DOB: 04/13/1986  06/22/2020  Cindy Howell was observed post Covid-19 immunization for 15 minutes without incident. She was provided with Vaccine Information Sheet and instruction to access the V-Safe system.   Cindy Howell was instructed to call 911 with any severe reactions post vaccine: Marland Kitchen Difficulty breathing  . Swelling of face and throat  . A fast heartbeat  . A bad rash all over body  . Dizziness and weakness

## 2020-06-22 NOTE — Patient Instructions (Signed)
It was nice to meet you today!  I do not think your pain is related to your heart You can try tylenol or ibuprofen when the pain comes on If it gets worse or you have shortness of breath with it, please come back here or go to ER.  COVID booster shot today  Try pepcid for your heartburn. I sent this in. If this does not work, let us know and we can restart the omeprazole.  Be well, Dr. Ardelia Mems

## 2020-06-22 NOTE — Patient Instructions (Signed)
  Ms. [place]  it was nice speaking with you. Please call me directly if you have questions about the goals we discussed.  Goals Addressed            This Visit's Progress              . Find Help in My Community/ counseling services       Follow Up Date 07/04/2020    - follow-up on referral from [facility] if you do not receive a call   Why is this important?   Knowing how and where to find help for yourself or family in your neighborhood and community is an important skill.  Congratulations on making this step.     Notes:  [place]  [address] [place] [address] [phone]        Ms. Chunn received Care Management services today:  1. Care Management services include personalized support from designated clinical staff supervised by her physician, including individualized plan of care and coordination with other care providers 2. 24/7 contact [phone] for assistance for urgent and routine care needs. 3. Care Management are voluntary services and be declined at any time by calling the office.  Patient verbalizes understanding of instructions provided today.  Follow up plan: SW will follow up with patient by phone over the next 1 to 2 weeks  Maurine Cane, [place]

## 2020-06-23 ENCOUNTER — Telehealth: Payer: Self-pay | Admitting: Family Medicine

## 2020-06-23 NOTE — Telephone Encounter (Signed)
Patient requested to switch to Dr. Erin Hearing.  I discuss our switch policy with her and her husband. May only switch to a resident. 2nd year resident preferred. Will forward to the practice Admin to make switch  Will discuss with Dr. Nancy Fetter.

## 2020-06-23 NOTE — Telephone Encounter (Signed)
I have not seen this patient before. OK to switch if that is what she prefers.

## 2020-06-29 ENCOUNTER — Ambulatory Visit: Payer: Medicaid Other | Admitting: Dietician

## 2020-06-29 NOTE — Telephone Encounter (Signed)
I gave the parent a follow-up call. They have no female/female PCP preference.  Dr. Vanessa [place] is happy to take over their care.  Follow-up appointments were made.

## 2020-07-04 ENCOUNTER — Telehealth: Payer: Self-pay | Admitting: Licensed Clinical Social Worker

## 2020-07-04 NOTE — Chronic Care Management (AMB) (Signed)
  Social Work Care Management  Unsuccessful Phone Outreach   07/04/2020 Name: Cindy Howell MRN: [id number] DOB: 06-Oct-1985  Reason for referral : Care Coordination (f/u)  Cindy Howell is a 34 y.o. year old female who sees Lurline Del, DO for primary care.    F/U call to patient to assess needs and barriers with connecting to [facility] for counseling. Telephone outreach was unsuccessful.  Unable to leave a HIPPA compliant phone message due to voice mail being full.  Plan: LCSW will call again in 2 to 3 days.  Review of patient status, including review of consultants reports, relevant laboratory and other test results, and collaboration with appropriate care team members and the patient's provider was performed as part of comprehensive patient evaluation and provision of care management services.   Casimer Lanius, [place] / [place]   [phone] 9:04 AM

## 2020-07-06 ENCOUNTER — Ambulatory Visit: Payer: Medicaid Other | Admitting: Licensed Clinical Social Worker

## 2020-07-06 DIAGNOSIS — Z7189 Other specified counseling: Secondary | ICD-10-CM

## 2020-07-06 DIAGNOSIS — Z636 Dependent relative needing care at home: Secondary | ICD-10-CM

## 2020-07-06 NOTE — Chronic Care Management (AMB) (Signed)
°  Care Management   Clinical Social Work Follow Up   07/06/2020 Name: Cindy Howell MRN: [id number] DOB: April 21, 1986 Referred by: Lurline Del, DO  Reason for referral : Care Coordination  Cindy Howell is a 34 y.o. year old female who is a primary care patient of Lurline Del, DO.  Reason for follow-up: assess for barriers and progress with connecting for conjoint counseling .    Assessment: Patient is making progress towards goal. States she has her first appointment with [facility] Oct. 22nd.  Patient also reports concerns and stress related to son's recent diagnosis of autism.  She would like to know more information about autism and how to support her son.  Current Barriers:    Limited education about Autism*  Lacks knowledge of community resource: that can provide information and support  Recommendation: Patient may benefit from, and mom is in agreement for LCSW to make a referral to Care Management for At-Risk Children [facility]).  Plan: Patient would like continued follow-up. LCSW will f/u with patient in 2 to 3 weeks  Clinical Social Work Goal(s): Over the next  30 days, LCSW will assist patient with connecting to available resources  Interventions provided by LCSW:   Assessment of needs, as well as how impacting, barriers , progress, agencies contacted and outcome      Provided patient with information about [facility] program  Formal referral faxed to [facility]   Solution-Focused Strategies  Emotional/Supportive Counseling      Advance Directive Status: ; not addressed during this encounter.  SDOH (Social Determinants of Health) assessments performed:  ; No new needs identified   Goals Addressed            This Visit's Progress    caregiver resources and support for son       - review information I will mail you on for care management for children per your request I will place a formal referral -  also review the autism resources I am mailing - please review  information and resources provided from your son's doctor     Find Help in My Community/ counseling services    ON TRACK    Follow Up Date: Nov 2021  - keep appointment with [facility] Oct 22nd for conjoint counseling    Notes:  [facility]  [place]  [address] [place] [address] [phone]       Outpatient Encounter Medications as of 07/06/2020  Medication Sig   famotidine (PEPCID) 20 MG tablet Take 1 tablet (20 mg total) by mouth daily.   levonorgestrel (MIRENA) 20 MCG/24HR IUD 1 each by Intrauterine route once.   Multiple Vitamin (MULTIVITAMIN WITH MINERALS) TABS tablet Take 1 tablet by mouth daily.   No facility-administered encounter medications on file as of 07/06/2020.   Review of patient status, including review of consultants reports, relevant laboratory and other test results, and collaboration with appropriate care team members and the patient's provider was performed as part of comprehensive patient evaluation and provision of care management services.   Casimer Lanius, [place] / [place]   [phone] 12:14 PM

## 2020-07-06 NOTE — Patient Instructions (Signed)
  Cindy Howell  it was nice speaking with you. Please call me directly [phone] if you have questions about the goals we discussed. Goals Addressed            This Visit's Progress   . caregiver resources and support for son       - review information I will mail you on for care management for children per your request I will place a formal referral -  also review the autism resources I am mailing - please review information and resources provided from your son's doctor    . Find Help in My Community/ counseling services       Follow Up Date: Nov 2021    - keep appointment with [facility] Oct 22nd for conjoint counseling    Notes:  [facility]  [address] [place] [address] [phone]       [name] received Care Management services today:  1. Care Management services include personalized support from designated clinical staff supervised by her physician, including individualized plan of care and coordination with other care providers 2. 24/7 contact [phone] for assistance for urgent and routine care needs. 3. Care Management are voluntary services and be declined at any time by calling the office.  Patient verbalizes understanding of instructions provided today.  Follow up plan: SW will follow up with patient by phone over the next 2 to 3 weeks  Maurine Cane, LCSW

## 2020-07-06 NOTE — Progress Notes (Deleted)
    SUBJECTIVE:   CHIEF COMPLAINT / HPI:   Prediabetes: Patient is a 34 year old female presenting today for follow-up on her recent A1c of 6.4.  Currently on no medications.  A1c today of***.   Bengali interpreter used for duration of patient encounter***  PERTINENT  PMH / PSH: ***  OBJECTIVE:   There were no vitals taken for this visit.   General: NAD, pleasant, able to participate in exam Cardiac: RRR, no murmurs. Respiratory: CTAB, normal effort, No wheezes, rales or rhonchi Abdomen: Bowel sounds present, nontender, nondistended, no hepatosplenomegaly. Extremities: no edema or cyanosis. Skin: warm and dry, no rashes noted Neuro: alert, no obvious focal deficits Psych: Normal affect and mood  ASSESSMENT/PLAN:   No problem-specific Assessment & Plan notes found for this encounter.   Assessment: 34 year old female with a previous A1c of 6.4, with an A1c today of***.  Currently on no medications -Plan: -Discussed in detail modalities to improve activity and diet including starting to exercise, walking, joining a gym or using resistance training, and dietary modification such as reducing high calorie dense foods such as fried foods, increasing vegetables and fruits. **  Lurline Del, DO [place]    This note was prepared using Dragon voice recognition software and may include unintentional dictation errors due to the inherent limitations of voice recognition software.

## 2020-07-07 DIAGNOSIS — F4320 Adjustment disorder, unspecified: Secondary | ICD-10-CM | POA: Diagnosis not present

## 2020-07-07 DIAGNOSIS — F4310 Post-traumatic stress disorder, unspecified: Secondary | ICD-10-CM | POA: Diagnosis not present

## 2020-07-10 ENCOUNTER — Ambulatory Visit: Payer: Medicaid Other | Admitting: Family Medicine

## 2020-07-11 ENCOUNTER — Ambulatory Visit: Payer: Medicaid Other | Admitting: Dietician

## 2020-07-14 DIAGNOSIS — F4320 Adjustment disorder, unspecified: Secondary | ICD-10-CM | POA: Diagnosis not present

## 2020-07-14 DIAGNOSIS — F4310 Post-traumatic stress disorder, unspecified: Secondary | ICD-10-CM | POA: Diagnosis not present

## 2020-07-15 ENCOUNTER — Ambulatory Visit (HOSPITAL_COMMUNITY): Payer: Self-pay

## 2020-07-16 ENCOUNTER — Ambulatory Visit (HOSPITAL_COMMUNITY): Admit: 2020-07-16 | Discharge status: Absent without leave | Payer: Medicaid Other

## 2020-07-16 ENCOUNTER — Encounter (HOSPITAL_COMMUNITY): Payer: Self-pay

## 2020-07-16 ENCOUNTER — Other Ambulatory Visit: Payer: Self-pay

## 2020-07-16 ENCOUNTER — Ambulatory Visit (HOSPITAL_COMMUNITY)
Admission: RE | Admit: 2020-07-16 | Discharge: 2020-07-16 | Disposition: A | Discharge status: Home / Self Care | Payer: Medicaid Other | Source: Ambulatory Visit | Attending: Family Medicine | Admitting: Family Medicine

## 2020-07-16 VITALS — BP 137/85 | HR 108 | Temp 98.8°F | Resp 18

## 2020-07-16 DIAGNOSIS — M79601 Pain in right arm: Secondary | ICD-10-CM | POA: Diagnosis not present

## 2020-07-16 MED ORDER — IBUPROFEN 800 MG PO TABS
800.0000 mg | ORAL_TABLET | Freq: Once | ORAL | Status: AC
  Administered 2020-07-16: 800 mg via ORAL

## 2020-07-16 MED ORDER — IBUPROFEN 800 MG PO TABS: 800.0000 mg | ORAL_TABLET | Freq: Three times a day (TID) | ORAL | 0 refills | Status: DC

## 2020-07-16 MED ORDER — IBUPROFEN 800 MG PO TABS
ORAL_TABLET | ORAL | Status: AC
  Filled 2020-07-16: qty 1

## 2020-07-16 NOTE — ED Triage Notes (Signed)
Right arm is feeling awkward for 3 days. Reports pain and "jittery" feeling in right arm.  Patient touches right forearm, right palm of hand and index, middle, ring, and little finger as having altered sensation.  Patient is right handed.  Patient woke feeling difference in right upper extremity.  Denies neck pain.  No known injury

## 2020-07-16 NOTE — ED Provider Notes (Signed)
[facility]    CSN: [id number] Arrival date & time: 07/16/20  1054      History   Chief Complaint Chief Complaint  Patient presents with  . Arm Problem  . Appointment    11:00 am    HPI Cindy Howell is a 34 y.o. female.   HPI  Patient presents today with a concern of right arm pain. She reports having preeclampsia a year and a half ago and was concerned that her arm pain was related to elevated blood pressure. She does not check her blood pressure at home. She has 2 small tablets at home which she frequently picks up and denies any known injury. The pain is radiating from her bicep to her hand. She denies any overt wrist pain. She is also not experiencing any neck pain.  Past Medical History:  Diagnosis Date  . Allergy    goat meat, pollen  . Gestational diabetes   . Gestational diabetes mellitus (GDM) controlled on oral hypoglycemic drug 10/30/2018  . Hx of preeclampsia, prior pregnancy, currently pregnant 10/30/2018  . Pre-diabetes   . Urticaria     Patient Active Problem List   Diagnosis Date Noted  . Breast pain 06/20/2020  . Trapezius muscle strain, left, initial encounter 06/01/2020  . Stress at home 04/12/2020  . Blood pressure check 02/24/2020  . Family history of hyperlipidemia 07/21/2019  . TMJ (dislocation of temporomandibular joint), initial encounter 05/07/2019  . GERD (gastroesophageal reflux disease) 02/09/2019  . History of depression 11/02/2018  . Melasma 09/24/2018  . 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 Itching 02/19/2018  . Increased body weight 02/06/2018  . Allergic rhinitis 01/23/2018    Past Surgical History:  Procedure Laterality Date  . BREAST LUMPECTOMY WITH RADIOACTIVE SEED LOCALIZATION Left 07/28/2019   Procedure: LEFT BREAST LUMPECTOMY WITH RADIOACTIVE SEED LOCALIZATION;  Surgeon: Jovita Kussmaul, MD;  Location:  [facility];  Service: General;  Laterality: Left;  . WISDOM TOOTH EXTRACTION      OB History    Gravida  3   Para    3   Term  3   Preterm      AB      Living  3     SAB      TAB      Ectopic      Multiple      Live Births  3            Home Medications    Prior to Admission medications   Medication Sig Start Date End Date Taking? Authorizing Provider  famotidine (PEPCID) 20 MG tablet Take 1 tablet (20 mg total) by mouth daily. 06/22/20   Leeanne Rio, MD  levonorgestrel (MIRENA) 20 MCG/24HR IUD 1 each by Intrauterine route once.    [provider]  Multiple Vitamin (MULTIVITAMIN WITH MINERALS) TABS tablet Take 1 tablet by mouth daily.    [provider]    Family History Family History  Problem Relation Age of Onset  . Diabetes Mother   . Heart disease Mother   . Diabetes Father   . Heart disease Father   . Kidney disease Father   . Kidney disease Brother     Social History Social History   Tobacco Use  . Smoking status: Never Smoker  . Smokeless tobacco: Never Used  Vaping Use  . Vaping Use: Never used  Substance Use Topics  . Alcohol use: Never  . Drug use: Never     Allergies  Beef-derived products, Goat-derived products, and Shrimp [shellfish allergy] Review of Systems Review of Systems Pertinent negatives listed in HPI   Physical Exam Triage Vital Signs ED Triage Vitals  Enc Vitals Group     BP 07/16/20 1126 137/85     Pulse Rate 07/16/20 1126 (!) 108     Resp 07/16/20 1126 18     Temp 07/16/20 1126 98.8 F (37.1 C)     Temp Source 07/16/20 1126 Oral     SpO2 07/16/20 1126 100 %     Weight --      Height --      Head Circumference --      Peak Flow --      Pain Score 07/16/20 1124 4     Pain Loc --      Pain Edu? --      Excl. in [place]? --    No data found.  Updated Vital Signs BP 137/85 (BP Location: Left Arm)   Pulse (!) 108   Temp 98.8 F (37.1 C) (Oral)   Resp 18   SpO2 100%   Visual Acuity Right Eye Distance:   Left Eye Distance:   Bilateral Distance:    Right Eye Near:   Left Eye Near:     Bilateral Near:     Physical Exam Constitutional:      Appearance: Normal appearance. She is obese.  Musculoskeletal:     Right upper arm: Normal. No swelling, edema, deformity, lacerations, tenderness or bony tenderness.     Left upper arm: Normal. No swelling, edema, deformity, lacerations, tenderness or bony tenderness.     Right elbow: Normal. No swelling, deformity, effusion or lacerations. No tenderness.     Cervical back: Normal range of motion.  Skin:    General: Skin is warm.     Capillary Refill: Capillary refill takes less than 2 seconds.  Neurological:     General: No focal deficit present.     Mental Status: She is alert and oriented to person, place, and time.     Gait: Gait normal.  Psychiatric:        Mood and Affect: Mood normal.        Behavior: Behavior normal.        Thought Content: Thought content normal.        Judgment: Judgment normal.      UC Treatments / Results  Labs (all labs ordered are listed, but only abnormal results are displayed) Labs Reviewed - No data to display  EKG   Radiology No results found.  Procedures Procedures (including critical care time)  Medications Ordered in UC Medications - No data to display  Initial Impression / Assessment and Plan / UC Course  I have reviewed the triage vital signs and the nursing notes.  Pertinent labs & imaging results that were available during my care of the patient were reviewed by me and considered in my medical decision making (see chart for details).    Acute right arm pain which is likely related to musculoskeletal arm strain.  Patient has normal cap refill and normal sensation.  Blood pressure unremarkable today.  Advised to trial a course of ibuprofen 800 mg 4 times daily as needed for pain given she has not tried any medication for pain.  There is no acute injury therefore imaging was deferred.  If pain persist or worsens advised to follow-up with primary care provider or return for  evaluation. Final Clinical Impressions(s) / UC Diagnoses  Final diagnoses:  Pain of right upper extremity     Discharge Instructions     Take ibuprofen as needed the for pain. You may also apply heat to right arm as needed for pain. Your exam today is normal. Follow-up with PCP as needed.     ED Prescriptions    Medication Sig Dispense Auth. Provider   ibuprofen (ADVIL) 800 MG tablet Take 1 tablet (800 mg total) by mouth 3 (three) times daily. 30 tablet Scot Jun, FNP     PDMP not reviewed this encounter.   Scot Jun, FNP 07/16/20 1259

## 2020-07-16 NOTE — Discharge Instructions (Signed)
Take ibuprofen as needed the for pain. You may also apply heat to right arm as needed for pain. Your exam today is normal. Follow-up with PCP as needed.

## 2020-07-17 NOTE — Progress Notes (Deleted)
    SUBJECTIVE:   CHIEF COMPLAINT / HPI: Annual physical  Pre-diabetes Last A1c 6.4 04/12/20.  PERTINENT  PMH / PSH: Pre-diabetes  OBJECTIVE:   There were no vitals taken for this visit.  General: ***, NAD CV: RRR, no murmurs*** Pulm: CTAB, no wheezes or rales  ASSESSMENT/PLAN:   No problem-specific Assessment & Plan notes found for this encounter.     Zola Button, MD [place]

## 2020-07-18 ENCOUNTER — Encounter: Payer: Medicaid Other | Attending: Family Medicine | Admitting: Dietician

## 2020-07-18 ENCOUNTER — Other Ambulatory Visit: Payer: Self-pay

## 2020-07-18 ENCOUNTER — Encounter: Payer: Self-pay | Admitting: Dietician

## 2020-07-18 DIAGNOSIS — R7303 Prediabetes: Secondary | ICD-10-CM | POA: Insufficient documentation

## 2020-07-18 HISTORY — DX: Prediabetes: R73.03

## 2020-07-18 NOTE — Progress Notes (Signed)
Medical Nutrition Therapy  Follow-Up Visit Appt Start Time: 9:10am    End Time: 9:35am  Patient was originally seen on 05/24/20 for prediabetes and weight management.  Primary concerns today: weight management   NUTRITION ASSESSMENT   Anthropometrics  Weight: 198.7 lbs   Clinical Medical Hx: obesity, prediabetes, gestational diabetes Medications: see chart Labs: A1c 6.4% (04/09/20) Notable Signs/Symptoms: shoulder/back pain  Lifestyle & Dietary Hx Patient states that she would really like to lose weight because she feels uncomfortable and knows that it is bad for her health. Since previous visit, patient has started walking throughout the week, and goes 1 hour per day on 5 days per week! States that she would like to start going to the gym. States that she has cut down on the amount of rice she is eating at lunch and does not eat any at dinner. States that she still loves rice and sometimes feels as though she still overeats it.   Estimated daily fluid intake: 64+ oz Current average weekly physical activity: walking 1 hour/day on 5 days/week  24-Hr Dietary Recall First Meal: wheat bread + egg Snack: fruit Second Meal: rice + vegetables + meat/fish Snack: tea + cookies  Third Meal: meat/fish + fruit  Snack: - Beverages: water, diet Coke  Estimated Energy Needs Calories: 1800 Carbohydrate: 200g Protein: 113g Fat: 60g   NUTRITION DIAGNOSIS  05/24/2020: Excessive carbohydrate intake (NI-5.8.2) related to food/nutrition related knowledge deficit as evidenced by A1c level of 6.4%, diet history, and patient reported intake of high carbohydrate foods greater than estimated needs.    *07/18/2020 Updates: Per patient's dietary history, she has decreased her carbohydrate intake since previous visit especially by eating less amounts of carbohydrate foods (rice) at lunch and dinner. Patient has not had her A1c checked again.    NUTRITION INTERVENTION  Nutrition education (E-1) for weight  management, including the importance of water intake, balanced meals to include lots of vegetables and lean protein, and exercise. We reviewed the "MyPlate" model for eating. Patient was praised for increasing her physical activity level and for monitoring the amount of carbohydrates she is eating. We discussed methods for cooking vegetables and went over snack ideas.   Handouts Provided Include   Balanced Snacks  Learning Style & Readiness for Change Teaching method utilized: Visual & Auditory  Demonstrated degree of understanding via: Teach Back  Barriers to learning/adherence to lifestyle change: None Identified  Progress/ Updates on Pt's Goals . Went from no exercise to walking for 1 hour/day on 5 days/week . Has 1 slice of bread for breakfast with a protein sources . Eating less rice with lunch and dinner . NEW: Incorporate other types of exercise such as strength training at the gym . NEW: Drink at least 64-80 ounces of water daily   MONITORING & EVALUATION Dietary intake, weekly physical activity, and goals in 4-5 weeks.  Next Steps  Patient is to return to [facility] for follow up visit in 4-5 weeks.

## 2020-07-18 NOTE — Patient Instructions (Signed)
Great job with walking throughout the week and monitoring how much rice you are eating! You are making great progress with your goals. Remember your new goals to work on over the next few weeks:   Try new types of exercise, such as going to the gym  Make sure you are drinking at least 4-5 bottles of water (or 64-80 ounces) of fluid every day

## 2020-07-19 ENCOUNTER — Telehealth: Payer: Self-pay | Admitting: Licensed Clinical Social Worker

## 2020-07-19 NOTE — Chronic Care Management (AMB) (Signed)
    Clinical Social Work  Care Management Outreach   07/19/2020 Name: Stuart Guillen MRN: [id number] DOB: Aug 03, 1986  Zina Pitzer is a 34 y.o. year old female who is a primary care patient of Lurline Del, DO .   F/U phone call to Sanjuana Kava today to assess needs, and progress with care plan goals.  The outreach was unsuccessful. A HIPPA compliant phone message was left for the patient providing contact information and requesting a return call.   Plan: If no return call is received, will call again in 3 to 7 days.  Review of patient status, including review of consultants reports, relevant laboratory and other test results, and collaboration with appropriate care team members and the patient's provider was performed as part of comprehensive patient evaluation and provision of care management services.    Casimer Lanius, [place] / [place]   [phone] 8:46 AM

## 2020-07-21 ENCOUNTER — Encounter: Payer: Medicaid Other | Admitting: Family Medicine

## 2020-07-21 DIAGNOSIS — F4320 Adjustment disorder, unspecified: Secondary | ICD-10-CM | POA: Diagnosis not present

## 2020-07-21 DIAGNOSIS — F4310 Post-traumatic stress disorder, unspecified: Secondary | ICD-10-CM | POA: Diagnosis not present

## 2020-07-25 ENCOUNTER — Encounter: Payer: Self-pay | Admitting: Family Medicine

## 2020-07-25 ENCOUNTER — Telehealth: Payer: Self-pay | Admitting: Licensed Clinical Social Worker

## 2020-07-25 DIAGNOSIS — O24419 Gestational diabetes mellitus in pregnancy, unspecified control: Secondary | ICD-10-CM

## 2020-07-25 DIAGNOSIS — F419 Anxiety disorder, unspecified: Secondary | ICD-10-CM | POA: Insufficient documentation

## 2020-07-25 HISTORY — DX: Gestational diabetes mellitus in pregnancy, unspecified control: O24.419

## 2020-07-25 NOTE — Chronic Care Management (AMB) (Signed)
   Social Work Care Management  2nd Unsuccessful  Phone Outreach   07/25/2020 Name: Cindy Howell MRN: [id number] DOB: 07-11-1986  Referred by: Lurline Del, DO ,  Reason for referral : Care Coordination (f/u)   Cindy Howell is a 34 y.o. year old female who sees Lurline Del, DO for primary care.2nd unsuccessful telephone outreach attempt to [name] today to see how counseling is going. A HIPPA compliant phone message was left for the patient providing contact information and requesting a return call.  Plan: LCSW will wait for return call, if no return call is received, will reach out to [name] again over the next 7 days. If unable to reach [name] by phone on the 3rd attempt, will discontinue outreach calls but will be available at any time to provide services to [name].   Review of patient status, including review of consultants reports, relevant laboratory and other test results, and collaboration with appropriate care team members and the patient's provider was performed as part of comprehensive patient evaluation and provision of care management services.   Casimer Lanius, [place] / [place]   [phone] 2:19 PM

## 2020-07-26 ENCOUNTER — Ambulatory Visit: Payer: Medicaid Other | Admitting: Licensed Clinical Social Worker

## 2020-07-26 DIAGNOSIS — Z636 Dependent relative needing care at home: Secondary | ICD-10-CM

## 2020-07-26 NOTE — Patient Instructions (Signed)
  Cindy Howell  it was nice speaking with you. Please call me directly [phone] if you have questions about the goals we discussed. Goals Addressed            This Visit's Progress   . COMPLETED: caregiver resources and support for son   On track    - Continue to follow up on the autism resources to see where you are on the wait list - please review information and resources provided from your son's doctor    . COMPLETED: Find Help in My Community/ counseling services   On track     - keep appointments with [facility] for conjoint counseling   Notes:  [facility]  [address] [place]  [address] [phone]       Cindy Howell received Care Management services today:  1. Care Management services include personalized support from designated clinical staff supervised by her physician, including individualized plan of care and coordination with other care providers 2. 24/7 contact [phone] for assistance for urgent and routine care needs. 3. Care Management are voluntary services and be declined at any time by calling the office.  Patient verbalizes understanding of instructions provided today.  Follow up plan: Client will call office or LCSW as needed  Maurine Cane, LCSW

## 2020-07-26 NOTE — Chronic Care Management (AMB) (Signed)
  Care Management   Clinical Social Work Follow Up   07/26/2020 Name: Cindy Howell MRN: [id number] DOB: 1986/05/04 Referred by: Lurline Del, DO  Reason for referral : Care Coordination (f/u)  Cindy Howell is a 34 y.o. year old female who is a primary care patient of Lurline Del, DO.  Reason for follow-up: assess for barriers and progress with managing stressors .    Assessment: Patient has made progress and completed goals.  She has connected and started counseling with her husband. Reports things are going well.  She has also connected with autism resources for her son and will continue to explore more options.  They are currently on the wait list but she feels good about the progress that she has made.  Plan: No new goals established during this visit. Patient will contact LCSW is needed. LCSW will discontinue outreach calls.  Will disconnect from chart if there are no needs in the next 60 days.   Advance Directive Status: not addressed during this encounter.  SDOH (Social Determinants of Health) assessments performed:  No new needs identified   Patient Care Plan: Social Work  Completed 07/26/2020  Problem Identified: Need resoucres for son Resolved 07/26/2020  Goal: Therapeutic Alliance Established Completed 07/26/2020  Problem Identified: family stressor Resolved 07/26/2020  Goal: Self-Management Plan Developed Completed 07/26/2020  Problem Identified: managing caregive and family stress Resolved 07/26/2020  Priority: High  Goal: Coping Skills Enhanced Completed 07/26/2020  Note:      Outpatient Encounter Medications as of 07/26/2020  Medication Sig  . famotidine (PEPCID) 20 MG tablet Take 1 tablet (20 mg total) by mouth daily.  Marland Kitchen ibuprofen (ADVIL) 800 MG tablet Take 1 tablet (800 mg total) by mouth 3 (three) times daily.  Marland Kitchen levonorgestrel (MIRENA) 20 MCG/24HR IUD 1 each by Intrauterine route once.  . Multiple Vitamin (MULTIVITAMIN WITH MINERALS) TABS tablet Take 1 tablet by  mouth daily.   No facility-administered encounter medications on file as of 07/26/2020.   Review of patient status, including review of consultants reports, relevant laboratory and other test results, and collaboration with appropriate care team members and the patient's provider was performed as part of comprehensive patient evaluation and provision of care management services.   Casimer Lanius, [place] / [place]   [phone] 9:23 AM

## 2020-08-04 DIAGNOSIS — F4310 Post-traumatic stress disorder, unspecified: Secondary | ICD-10-CM | POA: Diagnosis not present

## 2020-08-04 DIAGNOSIS — F4320 Adjustment disorder, unspecified: Secondary | ICD-10-CM | POA: Diagnosis not present

## 2020-08-14 DIAGNOSIS — F4320 Adjustment disorder, unspecified: Secondary | ICD-10-CM | POA: Diagnosis not present

## 2020-08-14 DIAGNOSIS — F4310 Post-traumatic stress disorder, unspecified: Secondary | ICD-10-CM | POA: Diagnosis not present

## 2020-08-17 NOTE — Progress Notes (Signed)
SUBJECTIVE:   CHIEF COMPLAINT / HPI:   Reflux: Switched from omeprazole to pepcid, has been taking pepcid as needed but it doesn't seem to work as well. Is interested in going back to omeprazole.  She states she only seems to get the reflux symptoms after eating fried foods.  She states that taking the Pepcid as needed has not really assisted with her reflux symptoms but she has not tried taking it on a daily basis to improve her reflux.  Screen high cholesterol Father had a heart attack at age 59. No current symptoms in patient.  She had previous lipid panel checked last year which showed LDL of 77, HDL of 38.  Patient states that she does request checking her cholesterol level today.  Prediabetes: Last A1C in July of 6.4. No medications. No current symptoms. Is trying to exercise via walking 2-3x per week, some weeks every day. Is interested in trying some yoga videos at home for exercise. Has cut her complex carbs (rice) down to one meal per day.  She is also try to reduce her fried foods as she understands that this can be beneficial to help with weight loss.  She states that she is actively interested in trying to lose weight and that she might make a follow-up appointment in the future to discuss this exclusively.  PERTINENT  PMH / PSH: Family history of hyperlipidemia  OBJECTIVE:   BP 110/68   Pulse 70   Ht 5\' 4"  (1.626 m)   Wt 195 lb (88.5 kg)   BMI 33.47 kg/m      Office Visit from 08/18/2020 in [place]  PHQ-9 Total Score 1     General: NAD, pleasant, able to participate in exam Cardiac: RRR, no murmurs. Respiratory: CTAB, normal effort, No wheezes, rales or rhonchi Skin: warm and dry, no rashes noted Neuro: alert, no obvious focal deficits Psych: Normal affect and mood  ASSESSMENT/PLAN:   GERD (gastroesophageal reflux disease) Assessment: 34 year old female with a history of GERD symptoms, she states these occur most commonly after eating  fried foods.  She has previously used omeprazole with got good results from this but was recently switched to Pepcid in order to prevent long-term adverse effects.  Patient states that she has been taking the Pepcid as needed once the reflux begins and has not seen as good of a benefit with this. Plan: -Discussed with patient the Pepcid would work best if taken preventatively and I recommend she try it for 2 weeks daily to see if this improves her reflux symptoms.  If she continues to have reflux symptoms with daily Pepcid she will give me a call or send a MyChart message and I will send in omeprazole and discontinue the Pepcid at that time -Also discussed with patient that trying to reduce her fried food intake as this seems to be the cause of her reflux may be beneficial.  Family history of hyperlipidemia Assessment: Patient with family history of hyperlipidemia and had a father who had a myocardial infarction at age 50.  She had a recent cholesterol check last year which showed LDL in appropriate range with HDL mildly reduced at 38.  Patient request recheck in lipid panel today which is appropriate. Plan: -We will recheck lipid panel as above  Prediabetes Assessment: Patient with a history of prediabetes, diet controlled at this time.  Her last A1c was in July of 6.4.  Patient not currently on any medications.  She has  been trying to exercise more by walking 2 or 3 times per week, sometimes she walks every day but sometimes due to cold weather or rainy weather she is not getting outside to walk at all.  Patient does endorse that she is interested in trying to lose weight and has been trying to exercise more and watch her diet in order to do this.  She states that her diet consist of large amounts of rice due to her culture and that she has been trying to reduce this particularly in later meals to try to have 1 meal of a large amount of complex carbs with other meals with only a small to moderate  amount.  She is also try to reduce her fried food intake. Plan: -Discussed with patient that she may try some yoga videos at home or Pilates videos at home on days where is dreary outside as these can provide quick simple exercise routine she can try without needing much equipment.  Patient plans to give these a try and if she experiences any pain or discomfort during then she will discontinue those exercises or reach out to the provider. -Also discussed with patient we can have a dedicated appointment in the future to discuss weight loss modalities -We will check A1c today as above    [address]    This note was prepared using Dragon voice recognition software and may include unintentional dictation errors due to the inherent limitations of voice recognition software.

## 2020-08-18 ENCOUNTER — Ambulatory Visit (INDEPENDENT_AMBULATORY_CARE_PROVIDER_SITE_OTHER): Payer: Medicaid Other | Admitting: Family Medicine

## 2020-08-18 ENCOUNTER — Other Ambulatory Visit: Payer: Self-pay

## 2020-08-18 ENCOUNTER — Encounter: Payer: Self-pay | Admitting: Family Medicine

## 2020-08-18 VITALS — BP 110/68 | HR 70 | Ht 64.0 in | Wt 195.0 lb

## 2020-08-18 DIAGNOSIS — R7303 Prediabetes: Secondary | ICD-10-CM

## 2020-08-18 DIAGNOSIS — Z83438 Family history of other disorder of lipoprotein metabolism and other lipidemia: Secondary | ICD-10-CM

## 2020-08-18 DIAGNOSIS — Z13220 Encounter for screening for lipoid disorders: Secondary | ICD-10-CM

## 2020-08-18 DIAGNOSIS — K219 Gastro-esophageal reflux disease without esophagitis: Secondary | ICD-10-CM

## 2020-08-18 MED ORDER — FAMOTIDINE 20 MG PO TABS
20.0000 mg | ORAL_TABLET | Freq: Every day | ORAL | 1 refills | Status: DC
Start: 2020-08-18 — End: 2020-10-10

## 2020-08-18 NOTE — Assessment & Plan Note (Signed)
Assessment: 34 year old female with a history of GERD symptoms, she states these occur most commonly after eating fried foods.  She has previously used omeprazole with got good results from this but was recently switched to Pepcid in order to prevent long-term adverse effects.  Patient states that she has been taking the Pepcid as needed once the reflux begins and has not seen as good of a benefit with this. Plan: -Discussed with patient the Pepcid would work best if taken preventatively and I recommend she try it for 2 weeks daily to see if this improves her reflux symptoms.  If she continues to have reflux symptoms with daily Pepcid she will give me a call or send a MyChart message and I will send in omeprazole and discontinue the Pepcid at that time -Also discussed with patient that trying to reduce her fried food intake as this seems to be the cause of her reflux may be beneficial.

## 2020-08-18 NOTE — Assessment & Plan Note (Signed)
Assessment: Patient with family history of hyperlipidemia and had a father who had a myocardial infarction at age 32.  She had a recent cholesterol check last year which showed LDL in appropriate range with HDL mildly reduced at 38.  Patient request recheck in lipid panel today which is appropriate. Plan: -We will recheck lipid panel as above

## 2020-08-18 NOTE — Assessment & Plan Note (Signed)
Assessment: Patient with a history of prediabetes, diet controlled at this time.  Her last A1c was in July of 6.4.  Patient not currently on any medications.  She has been trying to exercise more by walking 2 or 3 times per week, sometimes she walks every day but sometimes due to cold weather or rainy weather she is not getting outside to walk at all.  Patient does endorse that she is interested in trying to lose weight and has been trying to exercise more and watch her diet in order to do this.  She states that her diet consist of large amounts of rice due to her culture and that she has been trying to reduce this particularly in later meals to try to have 1 meal of a large amount of complex carbs with other meals with only a small to moderate amount.  She is also try to reduce her fried food intake. Plan: -Discussed with patient that she may try some yoga videos at home or Pilates videos at home on days where is dreary outside as these can provide quick simple exercise routine she can try without needing much equipment.  Patient plans to give these a try and if she experiences any pain or discomfort during then she will discontinue those exercises or reach out to the provider. -Also discussed with patient we can have a dedicated appointment in the future to discuss weight loss modalities -We will check A1c today as above

## 2020-08-18 NOTE — Patient Instructions (Signed)
It was great to see you! Thank you for allowing me to participate in your care!  Our plans for today:  -I provided a refill of your Pepcid medication, I would like for you to take this daily for the next 2 weeks and let me know if you develop any reflux symptoms.  If you develop reflux symptoms after trying this we can switch back to the omeprazole.  The Pepcid medication works best if taken before the reflux starts. -We are checking your cholesterol levels today, I will send you a MyChart message or call you with the results -We are also checking your A1c to look at your blood sugar levels over the past 3 months, I will let you know the results of these when they return. -Continue with a great work with your diet and exercise.  As discussed I think trying some YouTube videos for yoga or Pilates at home may provide a good option for some exercise when it is too cold to get outside and walk.  I recommend starting with the beginner level exercises and if you do experience any pain or difficulty with the exercises then I would try a different video or we can discuss proper form for these exercises if you have trouble at a following appointment.  We are checking some labs today, I will call you if they are abnormal will send you a MyChart message or a letter if they are normal.  If you do not hear about your labs in the next 2 weeks please let us know.  Take care and seek immediate care sooner if you develop any concerns.   Dr. Lurline Del, [place]

## 2020-08-19 LAB — HEMOGLOBIN A1C
Est. average glucose Bld gHb Est-mCnc: 143 mg/dL
Hgb A1c MFr Bld: 6.6 % — ABNORMAL HIGH (ref 4.8–5.6)

## 2020-08-19 LAB — LIPID PANEL
Chol/HDL Ratio: 4.1 ratio (ref 0.0–4.4)
Cholesterol, Total: 170 mg/dL (ref 100–199)
HDL: 41 mg/dL (ref >39)
LDL Chol Calc (NIH): 104 mg/dL — ABNORMAL HIGH (ref 0–99)
Triglycerides: 139 mg/dL (ref 0–149)
VLDL Cholesterol Cal: 25 mg/dL (ref 5–40)

## 2020-08-22 ENCOUNTER — Ambulatory Visit: Payer: Medicaid Other | Admitting: Dietician

## 2020-08-24 ENCOUNTER — Ambulatory Visit (HOSPITAL_COMMUNITY)
Admission: RE | Admit: 2020-08-24 | Discharge: 2020-08-24 | Disposition: A | Discharge status: Home / Self Care | Payer: Medicaid Other | Source: Ambulatory Visit | Attending: Student | Admitting: Student

## 2020-08-24 ENCOUNTER — Ambulatory Visit (INDEPENDENT_AMBULATORY_CARE_PROVIDER_SITE_OTHER): Payer: Medicaid Other

## 2020-08-24 ENCOUNTER — Other Ambulatory Visit: Payer: Self-pay

## 2020-08-24 ENCOUNTER — Encounter (HOSPITAL_COMMUNITY): Payer: Self-pay

## 2020-08-24 VITALS — BP 123/68 | HR 95 | Temp 98.9°F | Resp 19

## 2020-08-24 DIAGNOSIS — Z79811 Long term (current) use of aromatase inhibitors: Secondary | ICD-10-CM | POA: Insufficient documentation

## 2020-08-24 DIAGNOSIS — J209 Acute bronchitis, unspecified: Secondary | ICD-10-CM | POA: Insufficient documentation

## 2020-08-24 DIAGNOSIS — Z79899 Other long term (current) drug therapy: Secondary | ICD-10-CM | POA: Diagnosis not present

## 2020-08-24 DIAGNOSIS — R0602 Shortness of breath: Secondary | ICD-10-CM

## 2020-08-24 DIAGNOSIS — Z20822 Contact with and (suspected) exposure to covid-19: Secondary | ICD-10-CM | POA: Insufficient documentation

## 2020-08-24 DIAGNOSIS — Z7951 Long term (current) use of inhaled steroids: Secondary | ICD-10-CM | POA: Diagnosis not present

## 2020-08-24 DIAGNOSIS — R059 Cough, unspecified: Secondary | ICD-10-CM

## 2020-08-24 LAB — RESP PANEL BY RT-PCR (RSV, FLU A&B, COVID)  RVPGX2
Influenza A by PCR: NEGATIVE
Influenza B by PCR: NEGATIVE
Resp Syncytial Virus by PCR: NEGATIVE
SARS Coronavirus 2 by RT PCR: NEGATIVE

## 2020-08-24 MED ORDER — ALBUTEROL SULFATE HFA 108 (90 BASE) MCG/ACT IN AERS
1.0000 | INHALATION_SPRAY | Freq: Four times a day (QID) | RESPIRATORY_TRACT | 0 refills | Status: DC | PRN
Start: 2020-08-24 — End: 2020-10-10

## 2020-08-24 MED ORDER — BENZONATATE 100 MG PO CAPS: 100.0000 mg | ORAL_CAPSULE | Freq: Three times a day (TID) | ORAL | 0 refills | Status: DC

## 2020-08-24 NOTE — Discharge Instructions (Addendum)
I prescribed two medications for you to take: Tessalon and Albuterol. You can pick these up from your pharmacy today.   I also recommend trying Mucinex. You can buy this medication over the counter at your pharmacy. (If you have trouble finding it, your pharmacist can tell you where it's located in the store!)

## 2020-08-24 NOTE — ED Provider Notes (Signed)
[place]    CSN: [id number] Arrival date & time: 08/24/20  1406      History   Chief Complaint Chief Complaint  Patient presents with  . Cough  . Shortness of Breath  . Fever    HPI Cindy Howell is a 34 y.o. female Pt presents with productive cough, body aches, shortness of breath, sore throat, and fever x 5 days. States that the cough and shortness of breath have gotten worse over the last two days, but that body aches and fevers have subsided. Temperature has been running 99 at home and she endorses subjective chills. States husband recently had pneumonia last week and children tested positive for RSV. Denies ear pain, n/v/d, chest pain.  HPI  Past Medical History:  Diagnosis Date  . Allergy    goat meat, pollen  . Gestational diabetes   . Gestational diabetes mellitus (GDM) controlled on oral hypoglycemic drug 10/30/2018  . Hx of preeclampsia, prior pregnancy, currently pregnant 10/30/2018  . 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 Pre-diabetes   . Urticaria     Patient Active Problem List   Diagnosis Date Noted  . Anxiety 07/25/2020  . Gestational diabetes mellitus 07/25/2020  . Prediabetes 07/18/2020  . Breast pain 06/20/2020  . Trapezius muscle strain, left, initial encounter 06/01/2020  . Stress at home 04/12/2020  . Family history of hyperlipidemia 07/21/2019  . TMJ (dislocation of temporomandibular joint), initial encounter 05/07/2019  . GERD (gastroesophageal reflux disease) 02/09/2019  . History of depression 11/02/2018  . Melasma 09/24/2018  . 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 History of pre-eclampsia 05/18/2018  . History of gestational diabetes 05/11/2018  . 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 Increased body weight 02/06/2018  . Allergic rhinitis 01/23/2018    Past Surgical History:  Procedure Laterality Date  . BREAST LUMPECTOMY WITH RADIOACTIVE SEED  LOCALIZATION Left 07/28/2019   Procedure: LEFT BREAST LUMPECTOMY WITH RADIOACTIVE SEED LOCALIZATION;  Surgeon: Jovita Kussmaul, MD;  Location: [facility];  Service: General;  Laterality: Left;  . WISDOM TOOTH EXTRACTION      OB History    Gravida  3   Para  3   Term  3   Preterm      AB      Living  3     SAB      IAB      Ectopic      Multiple      Live Births  3            Home Medications    Prior to Admission medications   Medication Sig Start Date End Date Taking? Authorizing Provider  albuterol (VENTOLIN HFA) 108 (90 Base) MCG/ACT inhaler Inhale 1-2 puffs into the lungs every 6 (six) hours as needed for wheezing or shortness of breath. 08/24/20   Hazel Sams, PA-C  benzonatate (TESSALON) 100 MG capsule Take 1 capsule (100 mg total) by mouth every 8 (eight) hours. 08/24/20   Hazel Sams, PA-C  famotidine (PEPCID) 20 MG tablet Take 1 tablet (20 mg total) by mouth daily. 08/18/20   Lurline Del, DO  levonorgestrel (MIRENA) 20 MCG/24HR IUD 1 each by Intrauterine route once.    [provider]    Family History Family History  Problem Relation Age of Onset  . Diabetes Mother   . Heart disease Mother   . Diabetes Father   . Heart disease Father   .  Kidney disease Father   . Kidney disease Brother     Social History Social History   Tobacco Use  . Smoking status: Never Smoker  . Smokeless tobacco: Never Used  Vaping Use  . Vaping Use: Never used  Substance Use Topics  . Alcohol use: Never  . Drug use: Never     Allergies   Beef-derived products, Goat-derived products, and Shrimp [shellfish allergy]   Review of Systems Review of Systems  Constitutional: Positive for chills. Negative for appetite change and fever.  HENT: Positive for congestion and sore throat. Negative for ear pain, hearing loss, sinus pressure and sinus pain.   Respiratory: Positive for cough and shortness of breath.   Cardiovascular: Negative for chest pain.   Gastrointestinal: Negative for abdominal pain, diarrhea and nausea.  Musculoskeletal: Positive for myalgias.  All other systems reviewed and are negative.    Physical Exam Triage Vital Signs ED Triage Vitals  Enc Vitals Group     BP 08/24/20 1457 123/68     Pulse Rate 08/24/20 1457 95     Resp 08/24/20 1457 19     Temp 08/24/20 1457 98.9 F (37.2 C)     Temp Source 08/24/20 1457 Oral     SpO2 08/24/20 1457 100 %     Weight --      Height --      Head Circumference --      Peak Flow --      Pain Score 08/24/20 1454 0     Pain Loc --      Pain Edu? --      Excl. in [place]? --    No data found.  Updated Vital Signs BP 123/68 (BP Location: Left Arm)   Pulse 95   Temp 98.9 F (37.2 C) (Oral)   Resp 19   SpO2 100%   Visual Acuity Right Eye Distance:   Left Eye Distance:   Bilateral Distance:    Right Eye Near:   Left Eye Near:    Bilateral Near:     Physical Exam Vitals reviewed.  Constitutional:      General: She is not in acute distress.    Appearance: She is well-developed. She is not ill-appearing.  HENT:     Head: Normocephalic and atraumatic.     Right Ear: Hearing, tympanic membrane, ear canal and external ear normal. No tenderness. There is no impacted cerumen. No mastoid tenderness. Tympanic membrane is not perforated, erythematous, retracted or bulging.     Left Ear: Hearing, tympanic membrane, ear canal and external ear normal. No tenderness. There is no impacted cerumen. No mastoid tenderness. Tympanic membrane is not perforated, erythematous, retracted or bulging.     Mouth/Throat:     Mouth: Mucous membranes are moist.     Pharynx: Uvula midline. No oropharyngeal exudate, posterior oropharyngeal erythema or uvula swelling.     Tonsils: No tonsillar exudate.  Cardiovascular:     Rate and Rhythm: Normal rate and regular rhythm.     Heart sounds: Normal heart sounds.  Pulmonary:     Effort: Pulmonary effort is normal. No accessory muscle usage,  respiratory distress or retractions.     Breath sounds: Examination of the right-middle field reveals wheezing. Wheezing present.  Lymphadenopathy:     Cervical: No cervical adenopathy.  Neurological:     Mental Status: She is alert.  Psychiatric:        Attention and Perception: Attention and perception normal.  Mood and Affect: Mood and affect normal.      UC Treatments / Results  Labs (all labs ordered are listed, but only abnormal results are displayed) Labs Reviewed  RESP PANEL BY RT-PCR (RSV, FLU A&B, COVID)  [id number]    EKG   Radiology DG Chest 2 View  Result Date: 08/24/2020 CLINICAL DATA:  Cough and shortness of breath EXAM: CHEST - 2 VIEW COMPARISON:  None. FINDINGS: The heart size and mediastinal contours are within normal limits. Both lungs are clear. The visualized skeletal structures are unremarkable. IMPRESSION: No active cardiopulmonary disease. Electronically Signed   By: Van Clines M.D.   On: 08/24/2020 15:55    Procedures Procedures (including critical care time)  Medications Ordered in UC Medications - No data to display  Initial Impression / Assessment and Plan / UC Course  I have reviewed the triage vital signs and the nursing notes.  Pertinent labs & imaging results that were available during my care of the patient were reviewed by me and considered in my medical decision making (see chart for details).     CXR today showing No active cardiopulmonary disease. Awaiting labs for RSV, influenza, and covid-19. Plan to treat for acute bronchitis with tessalon, albuterol, and mucinex. Given history of prediabetes, deferred prednisone today.  Final Clinical Impressions(s) / UC Diagnoses   Final diagnoses:  Acute bronchitis, unspecified organism     Discharge Instructions     I prescribed two medications for you to take: Tessalon and Albuterol. You can pick these up from your pharmacy today.   I also recommend trying Mucinex. You can  buy this medication over the counter at your pharmacy. (If you have trouble finding it, your pharmacist can tell you where it's located in the store!)    ED Prescriptions    Medication Sig Dispense Auth. Provider   benzonatate (TESSALON) 100 MG capsule Take 1 capsule (100 mg total) by mouth every 8 (eight) hours. 21 capsule Hazel Sams, PA-C   albuterol (VENTOLIN HFA) 108 (90 Base) MCG/ACT inhaler Inhale 1-2 puffs into the lungs every 6 (six) hours as needed for wheezing or shortness of breath. 1 each Hazel Sams, PA-C     PDMP not reviewed this encounter.   Hazel Sams, PA-C 08/24/20 1746

## 2020-08-24 NOTE — ED Triage Notes (Signed)
Pt presents with productive cough, body aches, sob, and fever xs 5 days. States body aches and fevers have subsided. States husband recently had pneumonia last week and children tested positive for RSV.

## 2020-08-27 ENCOUNTER — Ambulatory Visit (HOSPITAL_COMMUNITY): Payer: Self-pay

## 2020-08-28 ENCOUNTER — Other Ambulatory Visit: Payer: Self-pay

## 2020-08-28 ENCOUNTER — Encounter (HOSPITAL_COMMUNITY): Payer: Self-pay

## 2020-08-28 ENCOUNTER — Ambulatory Visit (HOSPITAL_COMMUNITY)
Admission: RE | Admit: 2020-08-28 | Discharge: 2020-08-28 | Disposition: A | Discharge status: Home / Self Care | Payer: Medicaid Other | Source: Ambulatory Visit | Attending: Family Medicine | Admitting: Family Medicine

## 2020-08-28 VITALS — BP 120/79 | HR 96 | Temp 99.0°F | Resp 18 | Ht 64.0 in | Wt 195.0 lb

## 2020-08-28 DIAGNOSIS — R059 Cough, unspecified: Secondary | ICD-10-CM

## 2020-08-28 NOTE — ED Provider Notes (Signed)
[facility]    CSN: [id number] Arrival date & time: 08/28/20  1118      History   Chief Complaint Chief Complaint  Patient presents with  . Cough  . Sore Throat  . Otalgia    HPI Herta Hink is a 34 y.o. female.   Patient presents with cough, ear pain, sore throat x1 week.  She denies fever, rash, shortness of breath, vomiting, diarrhea, or other symptoms.  She was seen here on 08/24/2020; x-ray negative; diagnosed with acute bronchitis; treated with Tessalon Perles and albuterol inhaler; COVID, flu, RSV negative.  Her medical history includes allergic rhinitis, prediabetes, GERD, anxiety, depression.  The history is provided by the patient.    Past Medical History:  Diagnosis Date  . Allergy    goat meat, pollen  . Gestational diabetes   . Gestational diabetes mellitus (GDM) controlled on oral hypoglycemic drug 10/30/2018  . Hx of preeclampsia, prior pregnancy, currently pregnant 10/30/2018  . 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 Pre-diabetes   . Urticaria     Patient Active Problem List   Diagnosis Date Noted  . Anxiety 07/25/2020  . Gestational diabetes mellitus 07/25/2020  . Prediabetes 07/18/2020  . Breast pain 06/20/2020  . Trapezius muscle strain, left, initial encounter 06/01/2020  . Stress at home 04/12/2020  . Family history of hyperlipidemia 07/21/2019  . TMJ (dislocation of temporomandibular joint), initial encounter 05/07/2019  . GERD (gastroesophageal reflux disease) 02/09/2019  . History of depression 11/02/2018  . Melasma 09/24/2018  . 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 History of pre-eclampsia 05/18/2018  . History of gestational diabetes 05/11/2018  . 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 Increased body weight 02/06/2018  . Allergic rhinitis 01/23/2018    Past Surgical History:  Procedure Laterality Date  . BREAST LUMPECTOMY WITH RADIOACTIVE SEED  LOCALIZATION Left 07/28/2019   Procedure: LEFT BREAST LUMPECTOMY WITH RADIOACTIVE SEED LOCALIZATION;  Surgeon: Jovita Kussmaul, MD;  Location: [facility];  Service: General;  Laterality: Left;  . WISDOM TOOTH EXTRACTION      OB History    Gravida  3   Para  3   Term  3   Preterm      AB      Living  3     SAB      IAB      Ectopic      Multiple      Live Births  3            Home Medications    Prior to Admission medications   Medication Sig Start Date End Date Taking? Authorizing Provider  albuterol (VENTOLIN HFA) 108 (90 Base) MCG/ACT inhaler Inhale 1-2 puffs into the lungs every 6 (six) hours as needed for wheezing or shortness of breath. 08/24/20  Yes Hazel Sams, PA-C  benzonatate (TESSALON) 100 MG capsule Take 1 capsule (100 mg total) by mouth every 8 (eight) hours. 08/24/20  Yes Hazel Sams, PA-C  levonorgestrel (MIRENA) 20 MCG/24HR IUD 1 each by Intrauterine route once.   Yes [provider]  famotidine (PEPCID) 20 MG tablet Take 1 tablet (20 mg total) by mouth daily. 08/18/20   Lurline Del, DO    Family History Family History  Problem Relation Age of Onset  . Diabetes Mother   . Heart disease Mother   . Diabetes Father   . Heart disease Father   . Kidney disease  Father   . Kidney disease Brother     Social History Social History   Tobacco Use  . Smoking status: Never Smoker  . Smokeless tobacco: Never Used  Vaping Use  . Vaping Use: Never used  Substance Use Topics  . Alcohol use: Never  . Drug use: Never     Allergies   Beef-derived products, Goat-derived products, and Shrimp [shellfish allergy]   Review of Systems Review of Systems  Constitutional: Negative for chills and fever.  HENT: Positive for ear pain and sore throat. Negative for congestion.   Eyes: Negative for pain and visual disturbance.  Respiratory: Positive for cough. Negative for shortness of breath.   Cardiovascular: Negative for chest pain and  palpitations.  Gastrointestinal: Negative for abdominal pain, diarrhea and vomiting.  Genitourinary: Negative for dysuria and hematuria.  Musculoskeletal: Negative for arthralgias and back pain.  Skin: Negative for color change and rash.  Neurological: Negative for seizures and syncope.  All other systems reviewed and are negative.    Physical Exam Triage Vital Signs ED Triage Vitals  Enc Vitals Group     BP      Pulse      Resp      Temp      Temp src      SpO2      Weight      Height      Head Circumference      Peak Flow      Pain Score      Pain Loc      Pain Edu?      Excl. in [place]?    No data found.  Updated Vital Signs BP 120/79 (BP Location: Right Arm)   Pulse 96   Temp 99 F (37.2 C) (Oral)   Resp 18   Ht 5\' 4"  (1.626 m)   Wt 195 lb (88.5 kg)   LMP 08/28/2020   SpO2 99%   BMI 33.47 kg/m   Visual Acuity Right Eye Distance:   Left Eye Distance:   Bilateral Distance:    Right Eye Near:   Left Eye Near:    Bilateral Near:     Physical Exam Vitals and nursing note reviewed.  Constitutional:      General: She is not in acute distress.    Appearance: She is well-developed and well-nourished. She is not ill-appearing.  HENT:     Head: Normocephalic and atraumatic.     Right Ear: Tympanic membrane normal.     Left Ear: Tympanic membrane normal.     Nose: Nose normal.     Mouth/Throat:     Mouth: Mucous membranes are moist.     Pharynx: Oropharynx is clear.  Eyes:     Conjunctiva/sclera: Conjunctivae normal.  Cardiovascular:     Rate and Rhythm: Normal rate and regular rhythm.     Heart sounds: Normal heart sounds.  Pulmonary:     Effort: Pulmonary effort is normal. No respiratory distress.     Breath sounds: Normal breath sounds. No wheezing or rhonchi.  Abdominal:     Palpations: Abdomen is soft.     Tenderness: There is no abdominal tenderness. There is no guarding or rebound.  Musculoskeletal:        General: No edema.     Cervical back:  Neck supple.  Skin:    General: Skin is warm and dry.     Findings: No rash.  Neurological:     General: No focal deficit present.  Mental Status: She is alert and oriented to person, place, and time.     Gait: Gait normal.  Psychiatric:        Mood and Affect: Mood and affect and mood normal.        Behavior: Behavior normal.      UC Treatments / Results  Labs (all labs ordered are listed, but only abnormal results are displayed) Labs Reviewed - No data to display  EKG   Radiology No results found.  Procedures Procedures (including critical care time)  Medications Ordered in UC Medications - No data to display  Initial Impression / Assessment and Plan / UC Course  I have reviewed the triage vital signs and the nursing notes.  Pertinent labs & imaging results that were available during my care of the patient were reviewed by me and considered in my medical decision making (see chart for details).   Cough.  Patient is well-appearing and her exam is reassuring.  Instructed her to continue taking the [facility] as needed and to follow-up with her PCP this week.  She agrees to plan of care.   Final Clinical Impressions(s) / UC Diagnoses   Final diagnoses:  Cough     Discharge Instructions     .Schedule an appointment with your primary care provider to discuss your ongoing cough.  Continue taking the [employer] as directed.    ED Prescriptions    None     PDMP not reviewed this encounter.   Sharion Balloon, NP 08/28/20 1259

## 2020-08-28 NOTE — Discharge Instructions (Signed)
.  Schedule an appointment with your primary care provider to discuss your ongoing cough.  Continue taking the [employer] as directed.

## 2020-08-28 NOTE — ED Triage Notes (Signed)
Pt  reports for over one week she has had cough,sorethroat,bil ear pain.

## 2020-08-31 DIAGNOSIS — F4310 Post-traumatic stress disorder, unspecified: Secondary | ICD-10-CM | POA: Diagnosis not present

## 2020-08-31 DIAGNOSIS — F4320 Adjustment disorder, unspecified: Secondary | ICD-10-CM | POA: Diagnosis not present

## 2020-09-01 ENCOUNTER — Ambulatory Visit (INDEPENDENT_AMBULATORY_CARE_PROVIDER_SITE_OTHER): Payer: Medicaid Other | Admitting: Family Medicine

## 2020-09-01 VITALS — BP 110/72 | HR 89

## 2020-09-01 DIAGNOSIS — J069 Acute upper respiratory infection, unspecified: Secondary | ICD-10-CM

## 2020-09-01 NOTE — Patient Instructions (Signed)
Thank you for coming in to see [place] today! Please see below to review our plan for today's visit:  1.  Ear pain: You can try over-the-counter "swimmer's ear" drops to help decrease your ear pain.  Please follow the instructions on the bottle very closely. 2.  Continue Tessalon Perles as needed for cough.  You can also take Tylenol/ibuprofen as needed for aches, pains, cough, and fever. 3.  I also recommend that you use a humidifier to help thin secretions and help to decrease the inflammation and agitation of your airway. 4.  You most likely have the same virus as your daughter, which is a cold, and is usually self-limiting. 5.  Should you develop fevers that do not respond to Tylenol, inability to tolerate food and drink by mouth, or shortness of breath please seek emergency medical treatment.  Please call the clinic at [phone] if your symptoms worsen or you have any concerns. It was our pleasure to serve you!   Dr. Milus Banister [facility]   Viral Respiratory Infection A viral respiratory infection is an illness that affects parts of the body that are used for breathing. These include the lungs, nose, and throat. It is caused by a germ called a virus. Some examples of this kind of infection are:  A cold.  The flu (influenza).  A respiratory syncytial virus (RSV) infection. A person who gets this illness may have the following symptoms:  A stuffy or runny nose.  Yellow or green fluid in the nose.  A cough.  Sneezing.  Tiredness (fatigue).  Achy muscles.  A sore throat.  Sweating or chills.  A fever.  A headache. Follow these instructions at home: Managing pain and congestion  Take over-the-counter and prescription medicines only as told by your doctor.  If you have a sore throat, gargle with salt water. Do this 3-4 times per day or as needed. To make a salt-water mixture, dissolve -1 tsp of salt in 1 cup of warm water. Make sure that all the  salt dissolves.  Use nose drops made from salt water. This helps with stuffiness (congestion). It also helps soften the skin around your nose.  Drink enough fluid to keep your pee (urine) pale yellow. General instructions   Rest as much as possible.  Do not drink alcohol.  Do not use any products that have nicotine or tobacco, such as cigarettes and e-cigarettes. If you need help quitting, ask your doctor.  Keep all follow-up visits as told by your doctor. This is important. How is this prevented?   Get a flu shot every year. Ask your doctor when you should get your flu shot.  Do not let other people get your germs. If you are sick: ? Stay home from work or school. ? Wash your hands with soap and water often. Wash your hands after you cough or sneeze. If soap and water are not available, use hand sanitizer.  Avoid contact with people who are sick during cold and flu season. This is in fall and winter. Get help if:  Your symptoms last for 10 days or longer.  Your symptoms get worse over time.  You have a fever.  You have very bad pain in your face or forehead.  Parts of your jaw or neck become very swollen. Get help right away if:  You feel pain or pressure in your chest.  You have shortness of breath.  You faint or feel like you will faint.  You keep throwing up (vomiting).  You feel confused. Summary  A viral respiratory infection is an illness that affects parts of the body that are used for breathing.  Examples of this illness include a cold, the flu, and respiratory syncytial virus (RSV) infection.  The infection can cause a runny nose, cough, sneezing, sore throat, and fever.  Follow what your doctor tells you about taking medicines, drinking lots of fluid, washing your hands, resting at home, and avoiding people who are sick. This information is not intended to replace advice given to you by your health care provider. Make sure you discuss any questions  you have with your health care provider. Document Revised: 09/10/2018 Document Reviewed: 10/13/2017 Elsevier Patient Education  2020 [employer].

## 2020-09-01 NOTE — Progress Notes (Signed)
    SUBJECTIVE:   CHIEF COMPLAINT / HPI:   Congestion, cough: this is a pleasant patient who presents to clinic with her 44-month old daughter reporting 2 weeks of cough and congestion.   The patient was recently seen at Urgent Care on 08/24/2020 reporting with productive cough, body aches, shortness of breath, sore throat, and fever x 5 days (first day of illness being around 08/19/2020). She reported that her cough and shortness of breath had gotten worse over the last two days, but that body aches and fevers have subsided. Temperature at home has been 99*F, endorsed subjective chills. She states her husband recently had pneumonia last week and children tested positive for RSV.Denies ear pain, n/v/d, chest pain. At this appt Vitals were stable, respiratory panel collected was negative for COVID, Flu and RSV. Xray was normal. She was prescribed Albuterol and Tessalon perles, instructed to get Mucinex OTC from the pharmacy and provide supportive care at home.   She was seen again in Urgent Care on 08/28/2020 reporting same symptoms for 1 week, was most concerned with cough and new-onset ear pain. She was instructed to continue Tessalon perles and to follow up with PCP as needed.   Today she reports the same symptoms, believes they have only gotten slightly better. She believes she has Winter allergies and is requesting some medication for allergies. Her daughter 52-month old was recently seen with similar symptoms and tested positive for Enterovirus/Rhinovirus. The patient reports that because the patient is not sleeping well that she is not sleeping well.   PERTINENT  PMH / PSH:  Pre-diabetes GERD Mixed anxiety/depressive disorder  OBJECTIVE:   BP 110/72   Pulse 89   LMP 08/28/2020   SpO2 96%    Physical exam: General: nontoxic appearing, NAD Ears: bilateral patent external auditory canals with normal appearing tympanic membranes bilaterally, left TM with mild erythema but without bulging  or effusion Nose: patent nares with bilateral edematous and erythematous turbinates Throat: no pharyngeal erythema or tonsillar exudate Neck: no cervical LAD Respiratory: CTA bilaterally, moving air well, comfortable work of breathing, speaking in complete sentences   ASSESSMENT/PLAN:   Viral URI with cough Patient with symptoms of URI, recent negative COVID, RSV, and Flu. Left ear with mild erythema. Daughter with recent Rhino/Enterovirus on Resp Panel. Well appearing, satting well. Patient given the following instructions for supportive and symptomatic treatment at home: 1.  Ear pain: You can try over-the-counter "swimmer's ear" drops to help decrease your ear pain.  Please follow the instructions on the bottle very closely. 2.  Continue Tessalon Perles as needed for cough.  You can also take Tylenol/ibuprofen as needed for aches, pains, cough, and fever. 3.  I also recommend that you use a humidifier to help thin secretions and help to decrease the inflammation and agitation of your airway. 4.  You most likely have the same virus as your daughter, which is a cold, and is usually self-limiting. 5.  Should you develop fevers that do not respond to Tylenol, inability to tolerate food and drink by mouth, or shortness of breath please seek emergency medical treatment.     [place]

## 2020-09-04 DIAGNOSIS — J069 Acute upper respiratory infection, unspecified: Secondary | ICD-10-CM | POA: Insufficient documentation

## 2020-09-04 NOTE — Assessment & Plan Note (Signed)
Patient with symptoms of URI, recent negative COVID, RSV, and Flu. Left ear with mild erythema. Daughter with recent Rhino/Enterovirus on Resp Panel. Well appearing, satting well. Patient given the following instructions for supportive and symptomatic treatment at home: 1.  Ear pain: You can try over-the-counter "swimmer's ear" drops to help decrease your ear pain.  Please follow the instructions on the bottle very closely. 2.  Continue Tessalon Perles as needed for cough.  You can also take Tylenol/ibuprofen as needed for aches, pains, cough, and fever. 3.  I also recommend that you use a humidifier to help thin secretions and help to decrease the inflammation and agitation of your airway. 4.  You most likely have the same virus as your daughter, which is a cold, and is usually self-limiting. 5.  Should you develop fevers that do not respond to Tylenol, inability to tolerate food and drink by mouth, or shortness of breath please seek emergency medical treatment.

## 2020-09-05 DIAGNOSIS — F4310 Post-traumatic stress disorder, unspecified: Secondary | ICD-10-CM | POA: Diagnosis not present

## 2020-09-05 DIAGNOSIS — F4320 Adjustment disorder, unspecified: Secondary | ICD-10-CM | POA: Diagnosis not present

## 2020-09-14 DIAGNOSIS — F4320 Adjustment disorder, unspecified: Secondary | ICD-10-CM | POA: Diagnosis not present

## 2020-09-14 DIAGNOSIS — F4310 Post-traumatic stress disorder, unspecified: Secondary | ICD-10-CM | POA: Diagnosis not present

## 2020-09-20 DIAGNOSIS — F4320 Adjustment disorder, unspecified: Secondary | ICD-10-CM | POA: Diagnosis not present

## 2020-09-20 DIAGNOSIS — F4310 Post-traumatic stress disorder, unspecified: Secondary | ICD-10-CM | POA: Diagnosis not present

## 2020-09-28 DIAGNOSIS — F4310 Post-traumatic stress disorder, unspecified: Secondary | ICD-10-CM | POA: Diagnosis not present

## 2020-09-28 DIAGNOSIS — F4320 Adjustment disorder, unspecified: Secondary | ICD-10-CM | POA: Diagnosis not present

## 2020-10-04 DIAGNOSIS — F4320 Adjustment disorder, unspecified: Secondary | ICD-10-CM | POA: Diagnosis not present

## 2020-10-04 DIAGNOSIS — F4310 Post-traumatic stress disorder, unspecified: Secondary | ICD-10-CM | POA: Diagnosis not present

## 2020-10-10 ENCOUNTER — Ambulatory Visit: Payer: Medicaid Other | Admitting: Licensed Clinical Social Worker

## 2020-10-10 ENCOUNTER — Other Ambulatory Visit: Payer: Self-pay

## 2020-10-10 ENCOUNTER — Other Ambulatory Visit: Payer: Self-pay | Admitting: Family Medicine

## 2020-10-10 MED ORDER — OMEPRAZOLE 20 MG PO CPDR: 20.0000 mg | DELAYED_RELEASE_CAPSULE | Freq: Every day | ORAL | 0 refills | Status: DC

## 2020-10-10 MED ORDER — BENZONATATE 100 MG PO CAPS
100.0000 mg | ORAL_CAPSULE | Freq: Three times a day (TID) | ORAL | 0 refills | Status: DC
Start: 2020-10-10 — End: 2021-02-20

## 2020-10-10 MED ORDER — ALBUTEROL SULFATE HFA 108 (90 BASE) MCG/ACT IN AERS
1.0000 | INHALATION_SPRAY | Freq: Four times a day (QID) | RESPIRATORY_TRACT | 0 refills | Status: DC | PRN
Start: 2020-10-10 — End: 2021-10-19

## 2020-10-10 NOTE — Progress Notes (Signed)
Called patient in regard to her ongoing reflux.  At her last appointment we discussed starting Pepcid and if it did not work that we would discontinue the Pepcid and start omeprazole for 8 to 12 weeks.  Patient states that the Pepcid has not really worked and she would like to try the omeprazole.  I will start this today and send it into her pharmacy.  I have also provided the patient 1 more refill of her Ladona Ridgel since she states these have been helping with her cough

## 2020-10-10 NOTE — Telephone Encounter (Signed)
Patient calls nurse line requesting refills on cough medication and inhaler. Patient reports cough, onset six days ago. Denies any other sick symptoms. Denies exposure to COVID/ sick contacts.   Explained to patient that she may need follow up appointment before provider would send in rx. Patient verbalizes understanding and prefers to wait for response from provider, prior to scheduling appointment.   Patient also states that's Pepcid is not working for GERD symptoms. Patient reports that she was told to contact provider and alternative medication could be sent into pharmacy.   Please advise.   Talbot Grumbling, RN

## 2020-10-10 NOTE — Chronic Care Management (AMB) (Signed)
   Clinical Social Work  Care Management referral   10/10/2020 Name: Cindy Howell MRN: [id number] DOB: 27-Dec-1985  Cindy Howell is a 35 y.o. year old female who is a primary care patient of Lurline Del, DO . LCSW was consulted by patient via voice message for assistance with Community Resources  for her son. LCSW reached out to [facility] today by phone to assess needs.    Assessment: Patient continues counseling with husband and things are going well.  States she needs resources to get son connected for counseling resources with someone that specializes in Autism     Intervention:  Assessment of needs and barriers completed;   Provided patient with information about Autism Society [place] or [phone] .  Contact [place] or [facility] who specialize in Autism Resouces .  Other interventions include:Solution-Focused Strategies and Problem Solving;  Plan: Patient agreed to services provided today, however does not require or desire additional follow up. No care plan established on needed  Advance Directive Status: [place] and [place] application for related entries. ; not addressed during this encounter.  SDOH (Social Determinants of Health) assessments performed: No needs identified   Outpatient Encounter Medications as of 10/10/2020  Medication Sig  . albuterol (VENTOLIN HFA) 108 (90 Base) MCG/ACT inhaler Inhale 1-2 puffs into the lungs every 6 (six) hours as needed for wheezing or shortness of breath.  . benzonatate (TESSALON) 100 MG capsule Take 1 capsule (100 mg total) by mouth every 8 (eight) hours.  . famotidine (PEPCID) 20 MG tablet Take 1 tablet (20 mg total) by mouth daily.  Marland Kitchen levonorgestrel (MIRENA) 20 MCG/24HR IUD 1 each by Intrauterine route once.   No facility-administered encounter medications on file as of 10/10/2020.    Review of patient status, including review of consultants reports, relevant laboratory and other test results, and collaboration with appropriate  care team members and the patient's provider was performed as part of comprehensive patient evaluation and provision of care management services.     Casimer Lanius, [place] / [place]   [phone] 9:21 AM

## 2020-10-14 ENCOUNTER — Ambulatory Visit (HOSPITAL_COMMUNITY): Payer: Self-pay

## 2020-10-15 ENCOUNTER — Ambulatory Visit (HOSPITAL_COMMUNITY): Payer: Self-pay

## 2020-10-18 DIAGNOSIS — F4310 Post-traumatic stress disorder, unspecified: Secondary | ICD-10-CM | POA: Diagnosis not present

## 2020-10-18 DIAGNOSIS — F4320 Adjustment disorder, unspecified: Secondary | ICD-10-CM | POA: Diagnosis not present

## 2020-10-23 ENCOUNTER — Other Ambulatory Visit: Payer: Self-pay

## 2020-10-23 ENCOUNTER — Ambulatory Visit: Payer: Medicaid Other

## 2020-10-25 DIAGNOSIS — F4320 Adjustment disorder, unspecified: Secondary | ICD-10-CM | POA: Diagnosis not present

## 2020-10-25 DIAGNOSIS — F4310 Post-traumatic stress disorder, unspecified: Secondary | ICD-10-CM | POA: Diagnosis not present

## 2020-10-31 DIAGNOSIS — F4310 Post-traumatic stress disorder, unspecified: Secondary | ICD-10-CM | POA: Diagnosis not present

## 2020-10-31 DIAGNOSIS — F4320 Adjustment disorder, unspecified: Secondary | ICD-10-CM | POA: Diagnosis not present

## 2020-11-17 DIAGNOSIS — K219 Gastro-esophageal reflux disease without esophagitis: Secondary | ICD-10-CM | POA: Diagnosis not present

## 2020-11-17 DIAGNOSIS — Z3040 Encounter for surveillance of contraceptives, unspecified: Secondary | ICD-10-CM | POA: Diagnosis not present

## 2020-11-17 DIAGNOSIS — Z01411 Encounter for gynecological examination (general) (routine) with abnormal findings: Secondary | ICD-10-CM | POA: Diagnosis not present

## 2020-11-17 DIAGNOSIS — Z0000 Encounter for general adult medical examination without abnormal findings: Secondary | ICD-10-CM | POA: Diagnosis not present

## 2020-11-17 DIAGNOSIS — Z113 Encounter for screening for infections with a predominantly sexual mode of transmission: Secondary | ICD-10-CM | POA: Diagnosis not present

## 2020-11-17 DIAGNOSIS — Z124 Encounter for screening for malignant neoplasm of cervix: Secondary | ICD-10-CM | POA: Diagnosis not present

## 2020-11-17 DIAGNOSIS — Z6832 Body mass index (BMI) 32.0-32.9, adult: Secondary | ICD-10-CM | POA: Diagnosis not present

## 2020-11-17 DIAGNOSIS — L293 Anogenital pruritus, unspecified: Secondary | ICD-10-CM | POA: Diagnosis not present

## 2020-11-17 DIAGNOSIS — R102 Pelvic and perineal pain: Secondary | ICD-10-CM | POA: Diagnosis not present

## 2020-11-17 DIAGNOSIS — Z139 Encounter for screening, unspecified: Secondary | ICD-10-CM | POA: Diagnosis not present

## 2020-12-04 DIAGNOSIS — F4320 Adjustment disorder, unspecified: Secondary | ICD-10-CM | POA: Diagnosis not present

## 2020-12-04 DIAGNOSIS — F4310 Post-traumatic stress disorder, unspecified: Secondary | ICD-10-CM | POA: Diagnosis not present

## 2020-12-11 ENCOUNTER — Other Ambulatory Visit: Payer: Self-pay

## 2020-12-11 ENCOUNTER — Ambulatory Visit (INDEPENDENT_AMBULATORY_CARE_PROVIDER_SITE_OTHER): Payer: Medicaid Other | Admitting: Family Medicine

## 2020-12-11 VITALS — BP 112/70 | HR 91 | Ht 64.0 in | Wt 198.2 lb

## 2020-12-11 DIAGNOSIS — E1122 Type 2 diabetes mellitus with diabetic chronic kidney disease: Secondary | ICD-10-CM | POA: Insufficient documentation

## 2020-12-11 DIAGNOSIS — R7303 Prediabetes: Secondary | ICD-10-CM | POA: Diagnosis not present

## 2020-12-11 DIAGNOSIS — F4310 Post-traumatic stress disorder, unspecified: Secondary | ICD-10-CM | POA: Diagnosis not present

## 2020-12-11 DIAGNOSIS — E119 Type 2 diabetes mellitus without complications: Secondary | ICD-10-CM | POA: Diagnosis not present

## 2020-12-11 DIAGNOSIS — F4320 Adjustment disorder, unspecified: Secondary | ICD-10-CM | POA: Diagnosis not present

## 2020-12-11 MED ORDER — CETIRIZINE HCL 10 MG PO TABS
10.0000 mg | ORAL_TABLET | Freq: Every day | ORAL | 1 refills | Status: DC
Start: 2020-12-11 — End: 2021-02-05

## 2020-12-11 MED ORDER — FLUTICASONE PROPIONATE 50 MCG/ACT NA SUSP: 2.0000 | Freq: Every day | NASAL | 6 refills | Status: DC

## 2020-12-11 NOTE — Assessment & Plan Note (Signed)
Assessment, 35 year old female with 2 A1c is greater than 6.5 most recent of 6.7.  She is diet controlled at this time.  She is going to work on continuing to focus on diet and exercise. Plan: -No medication changes today -Follow-up in 3 months for an A1c

## 2020-12-11 NOTE — Progress Notes (Signed)
    SUBJECTIVE:   CHIEF COMPLAINT / HPI:   Diabetic Follow Up: Patient is a 35 y.o. female who present today for diabetic follow up.  Previously we checked her A1c and it was barely above the diabetic range at 6.6.  We plan to follow-up today to recheck this level to confirm whether she was or was not in the diabetic range.  She is currently diet controlled.  Patient had her A1c checked a couple weeks ago which was also elevated at 6.7.  This does confirm the diagnosis of diabetes with 2 A1c's greater than 6.5.  Spotting while IUD: She normally has 2-3 days of spotting with her IUD but this month.  She has had it for 2 years and it is a [employer]. This month she had 7 days of spotting, then a few days without and then a few more days of spotting.  PERTINENT  PMH / PSH: History of prediabetes  OBJECTIVE:   BP 112/70   Pulse 91   Ht 5\' 4"  (1.626 m)   Wt 198 lb 3.2 oz (89.9 kg)   SpO2 100%   BMI 34.02 kg/m    General: NAD, pleasant, able to participate in exam Cardiac: RRR, no murmurs. Respiratory: CTAB, normal effort Abdomen: Bowel sounds present, nontender, nondistended Psych: Normal affect and mood  ASSESSMENT/PLAN:   Type 2 diabetes mellitus without complications (HCC) Assessment, 35 year old female with 2 A1c is greater than 6.5 most recent of 6.7.  She is diet controlled at this time.  She is going to work on continuing to focus on diet and exercise. Plan: -No medication changes today -Follow-up in 3 months for an A1c   Spotting while on IUD: Assessment: 35 year old female with a Mirena in place which has been there for past 2-3 years.  She is otherwise doing well but has started having some spotting the first of which lasted about 7 days followed by 2 to 3 days without followed by 3 to 4 days with spotting.  She is not soaking through pads or having significant bleeding.  She previously would only have spotting for 2 or 3 days/month.  Patient just had her appointment with OB  which she states they did check that her IUD was still in place and the strings were visible so no concern for pregnancy at this time. Plan: -Continue to monitor for now -If patient develops spotting for longer than 7 days in a row can consider OCPs to help regulate her -Patient understands that her her periods may resume even while with IUD in place but that she still has protection against pregnancy  Lurline Del, [place]    This note was prepared using Dragon voice recognition software and may include unintentional dictation errors due to the inherent limitations of voice recognition software.

## 2020-12-11 NOTE — Patient Instructions (Signed)
It was great to see you! Thank you for allowing me to participate in your care!  I recommend that you always bring your medications to each appointment as this makes it easy to ensure we are on the correct medications and helps [place] not miss when refills are needed.  Our plans for today:  -Because she has had 2 A1c levels above 6.5 this does technically classify you as having type 2 diabetes -I would like to see back in 3 months for an A1c check -Continue to work on diet and exercise as this will be the most beneficial thing you can do at this time -We do not need to make any medication changes right now -For your spotting on the IUD we do not need to do anything at this time.  If it continues for longer than 7 days please let me know and we can consider an oral contraceptive, sometimes a normal.  Will resume even while being on the IUD, however the IUD will still protect you from pregnancy even in this case.   Take care and seek immediate care sooner if you develop any concerns.   Dr. Lurline Del, [place]

## 2020-12-21 DIAGNOSIS — F4320 Adjustment disorder, unspecified: Secondary | ICD-10-CM | POA: Diagnosis not present

## 2020-12-21 DIAGNOSIS — F4310 Post-traumatic stress disorder, unspecified: Secondary | ICD-10-CM | POA: Diagnosis not present

## 2020-12-25 DIAGNOSIS — F4320 Adjustment disorder, unspecified: Secondary | ICD-10-CM | POA: Diagnosis not present

## 2020-12-25 DIAGNOSIS — F4310 Post-traumatic stress disorder, unspecified: Secondary | ICD-10-CM | POA: Diagnosis not present

## 2021-01-01 DIAGNOSIS — F4320 Adjustment disorder, unspecified: Secondary | ICD-10-CM | POA: Diagnosis not present

## 2021-01-01 DIAGNOSIS — F4310 Post-traumatic stress disorder, unspecified: Secondary | ICD-10-CM | POA: Diagnosis not present

## 2021-01-08 DIAGNOSIS — F4310 Post-traumatic stress disorder, unspecified: Secondary | ICD-10-CM | POA: Diagnosis not present

## 2021-01-08 DIAGNOSIS — F4320 Adjustment disorder, unspecified: Secondary | ICD-10-CM | POA: Diagnosis not present

## 2021-01-16 DIAGNOSIS — F4320 Adjustment disorder, unspecified: Secondary | ICD-10-CM | POA: Diagnosis not present

## 2021-01-16 DIAGNOSIS — F4310 Post-traumatic stress disorder, unspecified: Secondary | ICD-10-CM | POA: Diagnosis not present

## 2021-01-27 ENCOUNTER — Ambulatory Visit (HOSPITAL_COMMUNITY): Payer: Self-pay

## 2021-02-05 ENCOUNTER — Encounter: Payer: Self-pay | Admitting: Family Medicine

## 2021-02-05 ENCOUNTER — Ambulatory Visit (INDEPENDENT_AMBULATORY_CARE_PROVIDER_SITE_OTHER): Payer: Medicaid Other | Admitting: Family Medicine

## 2021-02-05 ENCOUNTER — Other Ambulatory Visit: Payer: Self-pay

## 2021-02-05 VITALS — BP 120/78 | HR 68 | Ht 64.0 in | Wt 196.0 lb

## 2021-02-05 DIAGNOSIS — M545 Low back pain, unspecified: Secondary | ICD-10-CM

## 2021-02-05 DIAGNOSIS — J3089 Other allergic rhinitis: Secondary | ICD-10-CM | POA: Diagnosis not present

## 2021-02-05 MED ORDER — FEXOFENADINE HCL 60 MG PO TABS: 60.0000 mg | ORAL_TABLET | Freq: Two times a day (BID) | ORAL | 1 refills | Status: DC

## 2021-02-05 MED ORDER — IBUPROFEN 800 MG PO TABS: 800.0000 mg | ORAL_TABLET | Freq: Three times a day (TID) | ORAL | 0 refills | Status: DC | PRN

## 2021-02-05 NOTE — Patient Instructions (Addendum)
Your back pain appears to be more from muscle strain due to lifting your children and work. At this time I do not feel imaging is needed. I am sending in a referral to physical therapy per your request and am giving you some exercises and stretches you can try at home to help. You can continue to take the Ibuprofen, but avoid taking it more than 3 times a day if you can.   I am sending in another allergy medication that is less likely to cause drowsiness. If Cindy Howell is not covered, please call our office and we will send in Claritin for you.          Back Exercises These exercises help to make your trunk and back strong. They also help to keep the lower back flexible. Doing these exercises can help to prevent back pain or lessen existing pain.  If you have back pain, try to do these exercises 2-3 times each day or as told by your doctor.  As you get better, do the exercises once each day. Repeat the exercises more often as told by your doctor.  To stop back pain from coming back, do the exercises once each day, or as told by your doctor. Exercises Single knee to chest Do these steps 3-5 times in a row for each leg: 1. Lie on your back on a firm bed or the floor with your legs stretched out. 2. Bring one knee to your chest. 3. Grab your knee or thigh with both hands and hold them it in place. 4. Pull on your knee until you feel a gentle stretch in your lower back or buttocks. 5. Keep doing the stretch for 10-30 seconds. 6. Slowly let go of your leg and straighten it. Pelvic tilt Do these steps 5-10 times in a row: 1. Lie on your back on a firm bed or the floor with your legs stretched out. 2. Bend your knees so they point up to the ceiling. Your feet should be flat on the floor. 3. Tighten your lower belly (abdomen) muscles to press your lower back against the floor. This will make your tailbone point up to the ceiling instead of pointing down to your feet or the floor. 4. Stay in  this position for 5-10 seconds while you gently tighten your muscles and breathe evenly. Cat-cow Do these steps until your lower back bends more easily: 1. Get on your hands and knees on a firm surface. Keep your hands under your shoulders, and keep your knees under your hips. You may put padding under your knees. 2. Let your head hang down toward your chest. Tighten (contract) the muscles in your belly. Point your tailbone toward the floor so your lower back becomes rounded like the back of a cat. 3. Stay in this position for 5 seconds. 4. Slowly lift your head. Let the muscles of your belly relax. Point your tailbone up toward the ceiling so your back forms a sagging arch like the back of a cow. 5. Stay in this position for 5 seconds.   Press-ups Do these steps 5-10 times in a row: 1. Lie on your belly (face-down) on the floor. 2. Place your hands near your head, about shoulder-width apart. 3. While you keep your back relaxed and keep your hips on the floor, slowly straighten your arms to raise the top half of your body and lift your shoulders. Do not use your back muscles. You may change where you place your hands in  order to make yourself more comfortable. 4. Stay in this position for 5 seconds. 5. Slowly return to lying flat on the floor.   Bridges Do these steps 10 times in a row: 1. Lie on your back on a firm surface. 2. Bend your knees so they point up to the ceiling. Your feet should be flat on the floor. Your arms should be flat at your sides, next to your body. 3. Tighten your butt muscles and lift your butt off the floor until your waist is almost as high as your knees. If you do not feel the muscles working in your butt and the back of your thighs, slide your feet 1-2 inches farther away from your butt. 4. Stay in this position for 3-5 seconds. 5. Slowly lower your butt to the floor, and let your butt muscles relax. If this exercise is too easy, try doing it with your arms crossed  over your chest.   Belly crunches Do these steps 5-10 times in a row: 1. Lie on your back on a firm bed or the floor with your legs stretched out. 2. Bend your knees so they point up to the ceiling. Your feet should be flat on the floor. 3. Cross your arms over your chest. 4. Tip your chin a little bit toward your chest but do not bend your neck. 5. Tighten your belly muscles and slowly raise your chest just enough to lift your shoulder blades a tiny bit off of the floor. Avoid raising your body higher than that, because it can put too much stress on your low back. 6. Slowly lower your chest and your head to the floor. Back lifts Do these steps 5-10 times in a row: 1. Lie on your belly (face-down) with your arms at your sides, and rest your forehead on the floor. 2. Tighten the muscles in your legs and your butt. 3. Slowly lift your chest off of the floor while you keep your hips on the floor. Keep the back of your head in line with the curve in your back. Look at the floor while you do this. 4. Stay in this position for 3-5 seconds. 5. Slowly lower your chest and your face to the floor. Contact a doctor if:  Your back pain gets a lot worse when you do an exercise.  Your back pain does not get better 2 hours after you exercise. If you have any of these problems, stop doing the exercises. Do not do them again unless your doctor says it is okay. Get help right away if:  You have sudden, very bad back pain. If this happens, stop doing the exercises. Do not do them again unless your doctor says it is okay. This information is not intended to replace advice given to you by your health care provider. Make sure you discuss any questions you have with your health care provider. Document Revised: 05/28/2018 Document Reviewed: 05/28/2018 Elsevier Patient Education  2021 [employer].

## 2021-02-05 NOTE — Assessment & Plan Note (Signed)
B/l lower back pain x3 months without injury, no red flag symptoms. Physical exam consistent with MSK cause, do not feel the need for imaging currently given physical exam more consistent with sacroiliac location than lumbar spine. Patient requesting physical therapy referral. - Physical therapy referral placed - Ibuprofen 800mg  q8h PRN - Return precautions given

## 2021-02-05 NOTE — Progress Notes (Signed)
    SUBJECTIVE:   CHIEF COMPLAINT / HPI:   B/l lower back pain 3 month duration of intermittent lower back pain without known injury. Pain is typically located across her b/l lower back but occasionally will be present in other areas instead including her thighs (moving pain rather than radiation of pain). Reports pain max of 10/10, currently 7/10. Worse with bending movements (especially with lifting her children) and twisting motions. Currently treating with ibuprofen 800mg  2-3 times per day with some improvement. Patient has previously had physical therapy for her shoulder with significant improvement and would like a PT referral for her lower back pain. Denies saddle anesthesia, bowel/bladder incontinence, hx of cancer or masses, unintended weight loss  Allergic rhinitis Patient reports drowsiness with Zyrtec and would like another medication that will not cause as much drowsiness.   PERTINENT  PMH / PSH: Reviewed  OBJECTIVE:   BP 120/78   Pulse 68   Ht 5\' 4"  (1.626 m)   Wt 196 lb (88.9 kg)   SpO2 99%   BMI 33.64 kg/m   Gen: well-appearing, NAD CV: RRR, no m/r/g appreciated, no peripheral edema Pulm: CTAB, no wheezes/crackles MSK: Limited forward bending secondary to pain, tender to palpation over b/l posterior superior iliac spines, anterior superior iliac spine compression test positive on the left. Negative straight leg raise.  ASSESSMENT/PLAN:   Bilateral low back pain without sciatica B/l lower back pain x3 months without injury, no red flag symptoms. Physical exam consistent with MSK cause, do not feel the need for imaging currently given physical exam more consistent with sacroiliac location than lumbar spine. Patient requesting physical therapy referral. - Physical therapy referral placed - Ibuprofen 800mg  q8h PRN - Return precautions given   Allergic rhinitis Patient reported drowsiness with Zyrtec, requesting another medication. - Start Allegra - Discontinued  Zyrtec   Rise Patience, [place]

## 2021-02-05 NOTE — Assessment & Plan Note (Signed)
Patient reported drowsiness with Zyrtec, requesting another medication. - Start Allegra

## 2021-02-16 ENCOUNTER — Other Ambulatory Visit: Payer: Self-pay

## 2021-02-16 ENCOUNTER — Ambulatory Visit (HOSPITAL_COMMUNITY)
Admission: EM | Admit: 2021-02-16 | Discharge: 2021-02-16 | Disposition: A | Discharge status: Home / Self Care | Payer: Medicaid Other | Attending: Family Medicine | Admitting: Family Medicine

## 2021-02-16 ENCOUNTER — Encounter (HOSPITAL_COMMUNITY): Payer: Self-pay | Admitting: Emergency Medicine

## 2021-02-16 ENCOUNTER — Ambulatory Visit: Payer: Medicaid Other

## 2021-02-16 DIAGNOSIS — J069 Acute upper respiratory infection, unspecified: Secondary | ICD-10-CM | POA: Diagnosis not present

## 2021-02-16 DIAGNOSIS — J3089 Other allergic rhinitis: Secondary | ICD-10-CM | POA: Diagnosis not present

## 2021-02-16 MED ORDER — BENZONATATE 200 MG PO CAPS: 200.0000 mg | ORAL_CAPSULE | Freq: Three times a day (TID) | ORAL | 0 refills | Status: DC | PRN

## 2021-02-16 MED ORDER — PREDNISONE 20 MG PO TABS: 40.0000 mg | ORAL_TABLET | Freq: Every day | ORAL | 0 refills | Status: DC

## 2021-02-16 MED ORDER — ALBUTEROL SULFATE HFA 108 (90 BASE) MCG/ACT IN AERS: 1.0000 | INHALATION_SPRAY | Freq: Four times a day (QID) | RESPIRATORY_TRACT | 0 refills | Status: DC | PRN

## 2021-02-16 MED ORDER — MONTELUKAST SODIUM 10 MG PO TABS: 10.0000 mg | ORAL_TABLET | Freq: Every day | ORAL | 2 refills | Status: DC

## 2021-02-16 NOTE — ED Provider Notes (Signed)
[facility]    CSN: [id number] Arrival date & time: 02/16/21  1243      History   Chief Complaint Chief Complaint  Patient presents with  . Cough  . Sore Throat   HPI Cindy Boden is a 35 y.o. female.   Patient presenting today with 5-day history of sore throat, itchy and popping ears, productive cough.  States the cough is significant and sometimes she feels like she cannot catch her breath.  Denies fever, chills, body aches, chest pain, shortness of breath, abdominal pain, nausea vomiting or diarrhea.  Over-the-counter cold medications have not been giving relief.  She states she was also sick about 10 days ago with similar symptoms.  Does have a history of significant seasonal allergies, asthma and takes Claritin, Flonase daily.  Does not have any inhalers at home.  Daughter has recently been sick with cold-like symptoms but otherwise no new sick contacts.  Home COVID test was -2 days ago.    Past Medical History:  Diagnosis Date  . Allergy    goat meat, pollen  . Gestational diabetes   . Gestational diabetes mellitus (GDM) controlled on oral hypoglycemic drug 10/30/2018  . Hx of preeclampsia, prior pregnancy, currently pregnant 10/30/2018  . 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 Pre-diabetes   . Urticaria     Patient Active Problem List   Diagnosis Date Noted  . Type 2 diabetes mellitus without complications ([place]) 30/16/0109  . Viral URI with cough 09/04/2020  . Anxiety 07/25/2020  . Gestational diabetes mellitus 07/25/2020  . Prediabetes 07/18/2020  . Breast pain 06/20/2020  . Trapezius muscle strain, left, initial encounter 06/01/2020  . Stress at home 04/12/2020  . Family history of hyperlipidemia 07/21/2019  . TMJ (dislocation of temporomandibular joint), initial encounter 05/07/2019  . Bilateral low back pain without sciatica 02/26/2019  . GERD (gastroesophageal reflux disease) 02/09/2019  . History of depression 11/02/2018   . Melasma 09/24/2018  . 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 History of pre-eclampsia 05/18/2018  . History of gestational diabetes 05/11/2018  . 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 Increased body weight 02/06/2018  . Allergic rhinitis 01/23/2018    Past Surgical History:  Procedure Laterality Date  . BREAST LUMPECTOMY WITH RADIOACTIVE SEED LOCALIZATION Left 07/28/2019   Procedure: LEFT BREAST LUMPECTOMY WITH RADIOACTIVE SEED LOCALIZATION;  Surgeon: Jovita Kussmaul, MD;  Location: [facility];  Service: General;  Laterality: Left;  . WISDOM TOOTH EXTRACTION      OB History    Gravida  3   Para  3   Term  3   Preterm      AB      Living  3     SAB      IAB      Ectopic      Multiple      Live Births  3            Home Medications    Prior to Admission medications   Medication Sig Start Date End Date Taking? Authorizing Provider  albuterol (VENTOLIN HFA) 108 (90 Base) MCG/ACT inhaler Inhale 1-2 puffs into the lungs every 6 (six) hours as needed for wheezing or shortness of breath. 02/16/21  Yes Volney American, PA-C  benzonatate (TESSALON) 200 MG capsule Take 1 capsule (200 mg total) by mouth 3 (three) times daily as needed for cough. 02/16/21  Yes Volney American, PA-C  montelukast (SINGULAIR) 10 MG tablet Take 1 tablet (10 mg total) by mouth at bedtime. 02/16/21  Yes Volney American, PA-C  predniSONE (DELTASONE) 20 MG tablet Take 2 tablets (40 mg total) by mouth daily with breakfast. 02/16/21  Yes Volney American, PA-C  albuterol (VENTOLIN HFA) 108 (90 Base) MCG/ACT inhaler Inhale 1-2 puffs into the lungs every 6 (six) hours as needed for wheezing or shortness of breath. 10/10/20   Vanessa Beaver, Ryan, DO  benzonatate (TESSALON) 100 MG capsule Take 1 capsule (100 mg total) by mouth every 8 (eight) hours. 10/10/20   Lurline Del, DO  fexofenadine (ALLEGRA ALLERGY) 60 MG tablet Take 1 tablet (60 mg  total) by mouth 2 (two) times daily. 02/05/21   Lilland, Alana, DO  fluticasone (FLONASE) 50 MCG/ACT nasal spray Place 2 sprays into both nostrils daily. 12/11/20   Lurline Del, DO  ibuprofen (ADVIL) 800 MG tablet Take 1 tablet (800 mg total) by mouth every 8 (eight) hours as needed. 02/05/21   Lilland, Lorrin Goodell, DO  levonorgestrel (MIRENA) 20 MCG/24HR IUD 1 each by Intrauterine route once.    [provider]  omeprazole (PRILOSEC) 20 MG capsule Take 1 capsule (20 mg total) by mouth daily. 10/10/20   Lurline Del, DO    Family History Family History  Problem Relation Age of Onset  . Diabetes Mother   . Heart disease Mother   . Diabetes Father   . Heart disease Father   . Kidney disease Father   . Kidney disease Brother     Social History Social History   Tobacco Use  . Smoking status: Never Smoker  . Smokeless tobacco: Never Used  Vaping Use  . Vaping Use: Never used  Substance Use Topics  . Alcohol use: Never  . Drug use: Never     Allergies   Beef-derived products, Goat-derived products, and Shrimp [shellfish allergy]   Review of Systems Review of Systems Per HPI   Physical Exam Triage Vital Signs ED Triage Vitals  Enc Vitals Group     BP 02/16/21 1325 121/85     Pulse Rate 02/16/21 1325 (!) 102     Resp 02/16/21 1325 16     Temp 02/16/21 1325 98.5 F (36.9 C)     Temp Source 02/16/21 1325 Oral     SpO2 02/16/21 1325 100 %     Weight --      Height --      Head Circumference --      Peak Flow --      Pain Score 02/16/21 1322 0     Pain Loc --      Pain Edu? --      Excl. in [place]? --    No data found.  Updated Vital Signs BP 121/85 (BP Location: Right Arm)   Pulse (!) 102   Temp 98.5 F (36.9 C) (Oral)   Resp 16   SpO2 100%   Visual Acuity Right Eye Distance:   Left Eye Distance:   Bilateral Distance:    Right Eye Near:   Left Eye Near:    Bilateral Near:     Physical Exam Vitals and nursing note reviewed.  Constitutional:       Appearance: Normal appearance. She is not ill-appearing.  HENT:     Head: Atraumatic.     Right Ear: Tympanic membrane normal.     Left Ear: Tympanic membrane normal.     Nose: Rhinorrhea present.     Mouth/Throat:  Mouth: Mucous membranes are moist.     Pharynx: Posterior oropharyngeal erythema present. No oropharyngeal exudate.  Eyes:     Extraocular Movements: Extraocular movements intact.     Conjunctiva/sclera: Conjunctivae normal.  Cardiovascular:     Rate and Rhythm: Normal rate and regular rhythm.     Heart sounds: Normal heart sounds.  Pulmonary:     Effort: Pulmonary effort is normal. No respiratory distress.     Breath sounds: Normal breath sounds. No wheezing or rales.  Musculoskeletal:        General: Normal range of motion.     Cervical back: Normal range of motion and neck supple.  Skin:    General: Skin is warm and dry.  Neurological:     Mental Status: She is alert and oriented to person, place, and time.  Psychiatric:        Mood and Affect: Mood normal.        Thought Content: Thought content normal.        Judgment: Judgment normal.    UC Treatments / Results  Labs (all labs ordered are listed, but only abnormal results are displayed) Labs Reviewed - No data to display  EKG   Radiology No results found.  Procedures Procedures (including critical care time)  Medications Ordered in UC Medications - No data to display  Initial Impression / Assessment and Plan / UC Course  I have reviewed the triage vital signs and the nursing notes.  Pertinent labs & imaging results that were available during my care of the patient were reviewed by me and considered in my medical decision making (see chart for details).     Mildly tachycardic in triage but otherwise vital signs reassuring.  Home COVID test negative, declining retest today in clinic.  Given frequent recurrences of symptoms over the past month or so, suspect they are largely related to  uncontrolled seasonal allergies despite taking Claritin daily and Flonase as needed.  We will add Singulair and start Flonase twice daily in addition to the Claritin.  Also treat with prednisone burst to help with current flareup of symptoms.  Tessalon and albuterol given for as needed use additionally.  Follow-up with acutely worsening or unresolving symptoms.  Final Clinical Impressions(s) / UC Diagnoses   Final diagnoses:  Viral URI with cough  Seasonal allergic rhinitis due to other allergic trigger   Discharge Instructions   None    ED Prescriptions    Medication Sig Dispense Auth. Provider   montelukast (SINGULAIR) 10 MG tablet Take 1 tablet (10 mg total) by mouth at bedtime. 30 tablet Volney American, [place]   benzonatate (TESSALON) 200 MG capsule Take 1 capsule (200 mg total) by mouth 3 (three) times daily as needed for cough. 20 capsule Volney American, [place]   predniSONE (DELTASONE) 20 MG tablet Take 2 tablets (40 mg total) by mouth daily with breakfast. 10 tablet Volney American, PA-C   albuterol (VENTOLIN HFA) 108 (90 Base) MCG/ACT inhaler Inhale 1-2 puffs into the lungs every 6 (six) hours as needed for wheezing or shortness of breath. 18 g Volney American, [place]     PDMP not reviewed this encounter.   Volney American, [place] 02/16/21 1857

## 2021-02-16 NOTE — ED Triage Notes (Signed)
Pt presents with sore throat, itching in ears, and productive cough xs 5 days. States OTC medications are not giving relief.   States at home COVID test was negative.

## 2021-02-16 NOTE — Progress Notes (Deleted)
    SUBJECTIVE:   CHIEF COMPLAINT / HPI:   Cough/sore throat with negative COVID-19 testing at home: Patient is 35 year old female who presents with cough and sore throat for***days.  She states she did take a COVID-19 test at home which was negative***.  Other symptoms include***.  PERTINENT  PMH / PSH: None relevant  OBJECTIVE:   There were no vitals taken for this visit.   General: NAD, pleasant, able to participate in exam HEENT: No pharyngeal erythema,*** Cardiac: RRR, no murmurs. Respiratory: CTAB, normal effort, No wheezes, rales or rhonchi Abdomen: Bowel sounds present, nontender Skin: warm and dry, no rashes noted  ASSESSMENT/PLAN:   No problem-specific Assessment & Plan notes found for this encounter.    Assessment: 35 y.o. female with symptoms consistent with a viral upper respiratory tract infection.  Patient does not have any concerning symptoms which might warrant hospitalization at this time.  No shortness of breath***.  Overall differential can include seasonal allergies with postnasal drip leading to the cough and runny nose versus viral URI which can include common cold, influenza, COVID-19, or number of other respiratory viruses.***She did do a COVID-19 test at home which was negative, however is not yet had a PCR test***.  Unlikely bacterial at this time though the patient could potentially develop sinus infection due to her congestion. Plan: -We will send for COVID-19 testing*** -Discussed return precautions -Discussed symptomatic treatment and the lack of need for antibiotics.  Cindy Howell, Cindy Howell    This note was prepared using Dragon voice recognition software and may include unintentional dictation errors due to the inherent limitations of voice recognition software.

## 2021-02-19 ENCOUNTER — Ambulatory Visit (INDEPENDENT_AMBULATORY_CARE_PROVIDER_SITE_OTHER): Payer: Medicaid Other

## 2021-02-19 ENCOUNTER — Encounter (HOSPITAL_COMMUNITY): Payer: Self-pay | Admitting: Emergency Medicine

## 2021-02-19 ENCOUNTER — Other Ambulatory Visit: Payer: Self-pay | Admitting: Family Medicine

## 2021-02-19 ENCOUNTER — Ambulatory Visit (HOSPITAL_COMMUNITY)
Admission: EM | Admit: 2021-02-19 | Discharge: 2021-02-19 | Disposition: A | Discharge status: Home / Self Care | Payer: Medicaid Other | Attending: Emergency Medicine | Admitting: Emergency Medicine

## 2021-02-19 DIAGNOSIS — J019 Acute sinusitis, unspecified: Secondary | ICD-10-CM

## 2021-02-19 DIAGNOSIS — M545 Low back pain, unspecified: Secondary | ICD-10-CM

## 2021-02-19 DIAGNOSIS — B9689 Other specified bacterial agents as the cause of diseases classified elsewhere: Secondary | ICD-10-CM | POA: Diagnosis not present

## 2021-02-19 DIAGNOSIS — R0602 Shortness of breath: Secondary | ICD-10-CM | POA: Diagnosis not present

## 2021-02-19 MED ORDER — ALBUTEROL SULFATE HFA 108 (90 BASE) MCG/ACT IN AERS
INHALATION_SPRAY | RESPIRATORY_TRACT | Status: AC
  Filled 2021-02-19: qty 6.7

## 2021-02-19 MED ORDER — IBUPROFEN 600 MG PO TABS: 600.0000 mg | ORAL_TABLET | Freq: Three times a day (TID) | ORAL | 0 refills | Status: DC | PRN

## 2021-02-19 MED ORDER — HYDROCOD POLST-CPM POLST ER 10-8 MG/5ML PO SUER: 5.0000 mL | Freq: Every evening | ORAL | 0 refills | Status: DC | PRN

## 2021-02-19 MED ORDER — ALBUTEROL SULFATE HFA 108 (90 BASE) MCG/ACT IN AERS
2.0000 | INHALATION_SPRAY | Freq: Once | RESPIRATORY_TRACT | Status: AC
  Administered 2021-02-19: 2 via RESPIRATORY_TRACT

## 2021-02-19 MED ORDER — DOXYCYCLINE HYCLATE 100 MG PO CAPS: 100.0000 mg | ORAL_CAPSULE | Freq: Two times a day (BID) | ORAL | 0 refills | Status: DC

## 2021-02-19 NOTE — ED Provider Notes (Signed)
[facility]    CSN: [id number] Arrival date & time: 02/19/21  1109      History   Chief Complaint Chief Complaint  Patient presents with  . Shortness of Breath  . Cough    Chest pressure/ chest congestion     HPI Cindy Howell is a 35 y.o. female.   Patient presents with non productive dry cough, shortness of breath, chest tightness, chest soreness, mild sore throat, ear fullness beginning 8 days ago. Denies fever, chills, body aches, abdominal pain, N/V/D, headaches,. History of asthma and allergies, using daily inhalers and medications. Seen in UC on 02/16/21, diagnosed Viral URI with cough, endorses using prescribed medications as ordered with no relief. Took home Covid test this morning, negative.   Past Medical History:  Diagnosis Date  . Allergy    goat meat, pollen  . Gestational diabetes   . Gestational diabetes mellitus (GDM) controlled on oral hypoglycemic drug 10/30/2018  . Hx of preeclampsia, prior pregnancy, currently pregnant 10/30/2018  . 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 Pre-diabetes   . Urticaria     Patient Active Problem List   Diagnosis Date Noted  . Type 2 diabetes mellitus without complications ([place]) 82/99/3716  . Viral URI with cough 09/04/2020  . Anxiety 07/25/2020  . Gestational diabetes mellitus 07/25/2020  . Prediabetes 07/18/2020  . Breast pain 06/20/2020  . Trapezius muscle strain, left, initial encounter 06/01/2020  . Stress at home 04/12/2020  . Family history of hyperlipidemia 07/21/2019  . TMJ (dislocation of temporomandibular joint), initial encounter 05/07/2019  . Bilateral low back pain without sciatica 02/26/2019  . GERD (gastroesophageal reflux disease) 02/09/2019  . History of depression 11/02/2018  . Melasma 09/24/2018  . 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 History of pre-eclampsia 05/18/2018  . History of gestational diabetes 05/11/2018  . Mixed anxiety and depressive disorder, history of  05/11/2018  . 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 Increased body weight 02/06/2018  . Allergic rhinitis 01/23/2018    Past Surgical History:  Procedure Laterality Date  . BREAST LUMPECTOMY WITH RADIOACTIVE SEED LOCALIZATION Left 07/28/2019   Procedure: LEFT BREAST LUMPECTOMY WITH RADIOACTIVE SEED LOCALIZATION;  Surgeon: Jovita Kussmaul, MD;  Location: [facility];  Service: General;  Laterality: Left;  . WISDOM TOOTH EXTRACTION      OB History    Gravida  3   Para  3   Term  3   Preterm      AB      Living  3     SAB      IAB      Ectopic      Multiple      Live Births  3            Home Medications    Prior to Admission medications   Medication Sig Start Date End Date Taking? Authorizing Provider  chlorpheniramine-HYDROcodone (TUSSIONEX PENNKINETIC ER) 10-8 MG/5ML SUER Take 5 mLs by mouth at bedtime as needed for cough. 02/19/21  Yes Addisynn Vassell, Leitha Schuller, NP  doxycycline (VIBRAMYCIN) 100 MG capsule Take 1 capsule (100 mg total) by mouth 2 (two) times daily. 02/19/21  Yes Cynia Abruzzo, Leitha Schuller, NP  albuterol (VENTOLIN HFA) 108 (90 Base) MCG/ACT inhaler Inhale 1-2 puffs into the lungs every 6 (six) hours as needed for wheezing or shortness of breath. 10/10/20   Vanessa [place], Ryan, DO  albuterol (VENTOLIN HFA) 108 (90 Base) MCG/ACT inhaler Inhale 1-2 puffs into the lungs every 6 (six) hours as  needed for wheezing or shortness of breath. 02/16/21   Volney American, PA-C  benzonatate (TESSALON) 100 MG capsule Take 1 capsule (100 mg total) by mouth every 8 (eight) hours. 10/10/20   Lurline Del, DO  benzonatate (TESSALON) 200 MG capsule Take 1 capsule (200 mg total) by mouth 3 (three) times daily as needed for cough. 02/16/21   Volney American, PA-C  fexofenadine [facility] ALLERGY) 60 MG tablet Take 1 tablet (60 mg total) by mouth 2 (two) times daily. 02/05/21   Lilland, Alana, DO  fluticasone (FLONASE) 50 MCG/ACT nasal spray Place 2 sprays into both nostrils daily. 12/11/20   Lurline Del,  DO  ibuprofen (ADVIL) 600 MG tablet Take 1 tablet (600 mg total) by mouth every 8 (eight) hours as needed. 02/19/21   Hans Eden, NP  levonorgestrel (MIRENA) 20 MCG/24HR IUD 1 each by Intrauterine route once.    [provider]  montelukast (SINGULAIR) 10 MG tablet Take 1 tablet (10 mg total) by mouth at bedtime. 02/16/21   Volney American, PA-C  omeprazole (PRILOSEC) 20 MG capsule Take 1 capsule (20 mg total) by mouth daily. 10/10/20   Lurline Del, DO  predniSONE (DELTASONE) 20 MG tablet Take 2 tablets (40 mg total) by mouth daily with breakfast. 02/16/21   Volney American, PA-C    Family History Family History  Problem Relation Age of Onset  . Diabetes Mother   . Heart disease Mother   . Diabetes Father   . Heart disease Father   . Kidney disease Father   . Kidney disease Brother     Social History Social History   Tobacco Use  . Smoking status: Never Smoker  . Smokeless tobacco: Never Used  Vaping Use  . Vaping Use: Never used  Substance Use Topics  . Alcohol use: Never  . Drug use: Never     Allergies   Beef-derived products, Goat-derived products, and Shrimp [shellfish allergy]   Review of Systems Review of Systems  Defer to HPI    Physical Exam Triage Vital Signs ED Triage Vitals  Enc Vitals Group     BP 02/19/21 1217 107/68     Pulse Rate 02/19/21 1217 (!) 123     Resp 02/19/21 1217 20     Temp 02/19/21 1217 98.5 F (36.9 C)     Temp Source 02/19/21 1217 Oral     SpO2 02/19/21 1217 96 %     Weight --      Height --      Head Circumference --      Peak Flow --      Pain Score 02/19/21 1215 7     Pain Loc --      Pain Edu? --      Excl. in [place]? --    No data found.  Updated Vital Signs BP 107/68   Pulse (!) 123   Temp 98.5 F (36.9 C) (Oral)   Resp 20   SpO2 96%   Visual Acuity Right Eye Distance:   Left Eye Distance:   Bilateral Distance:    Right Eye Near:   Left Eye Near:    Bilateral Near:     Physical  Exam Constitutional:      Appearance: She is well-developed and normal weight.  HENT:     Head: Normocephalic.     Right Ear: Hearing, ear canal and external ear normal. A middle ear effusion is present.     Left Ear: Hearing, ear canal  and external ear normal. A middle ear effusion is present.     Nose: Congestion and rhinorrhea present.     Mouth/Throat:     Mouth: Mucous membranes are moist.     Pharynx: Oropharynx is clear.  Eyes:     Extraocular Movements: Extraocular movements intact.     Pupils: Pupils are equal, round, and reactive to light.  Cardiovascular:     Rate and Rhythm: Normal rate and regular rhythm.     Pulses: Normal pulses.     Heart sounds: Normal heart sounds.  Pulmonary:     Effort: Pulmonary effort is normal.     Breath sounds: Normal breath sounds.  Musculoskeletal:        General: Normal range of motion.     Cervical back: Normal range of motion.  Lymphadenopathy:     Cervical: Cervical adenopathy present.  Skin:    General: Skin is warm and dry.  Neurological:     Mental Status: She is alert and oriented to person, place, and time. Mental status is at baseline.  Psychiatric:        Mood and Affect: Mood normal.        Behavior: Behavior normal.      UC Treatments / Results  Labs (all labs ordered are listed, but only abnormal results are displayed) Labs Reviewed - No data to display  EKG   Radiology DG Chest 2 View  Result Date: 02/19/2021 CLINICAL DATA:  Shortness of breath. EXAM: CHEST - 2 VIEW COMPARISON:  PA and lateral chest 08/24/2020. FINDINGS: The lungs are clear. Heart size is normal. No pneumothorax or pleural fluid. No acute or focal bony abnormality. IMPRESSION: Negative chest. Electronically Signed   By: Inge Rise M.D.   On: 02/19/2021 13:02    Procedures Procedures (including critical care time)  Medications Ordered in UC Medications  albuterol (VENTOLIN HFA) 108 (90 Base) MCG/ACT inhaler 2 puff (2 puffs  Inhalation Provided for home use 02/19/21 1335)    Initial Impression / Assessment and Plan / UC Course  I have reviewed the triage vital signs and the nursing notes.  Pertinent labs & imaging results that were available during my care of the patient were reviewed by me and considered in my medical decision making (see chart for details).  Acute bacterial sinusitis  1. Advised to finish prednisone course, 1 day left for completion  2. Doxycycline 100 mg bid for 7 days, to start on day 10 of illness 3. Tussionex 10-8 mg at bedtime prn, continue prescribed cough medication for daytime symptoms 4. Continue use of allergy and asthma medications  5. Ibuprofen 600 mg tid prn  Final Clinical Impressions(s) / UC Diagnoses   Final diagnoses:  Acute bacterial sinusitis     Discharge Instructions     Finish prednisone medication for tomorrow   Start antibiotic twice a day for 7 days beginning Wednesday 02/21/21  Can use 30mL of tussionex as bedtime as needed for cough, be mindful this may make you drowsy, if it does not make you too drowsy you can take a dose in the morning    Can continue use of inhalers, allergy medications and the cough medicine you have for daytime use        ED Prescriptions    Medication Sig Dispense Auth. Provider   doxycycline (VIBRAMYCIN) 100 MG capsule Take 1 capsule (100 mg total) by mouth 2 (two) times daily. 14 capsule Roslyn Else R, NP   ibuprofen (ADVIL) 600 MG  tablet Take 1 tablet (600 mg total) by mouth every 8 (eight) hours as needed. 60 tablet Adib Wahba R, NP   chlorpheniramine-HYDROcodone (TUSSIONEX PENNKINETIC ER) 10-8 MG/5ML SUER Take 5 mLs by mouth at bedtime as needed for cough. 140 mL Kylee Nardozzi, Leitha Schuller, NP     I have reviewed the PDMP during this encounter.   Hans Eden, NP 02/19/21 1433

## 2021-02-19 NOTE — ED Triage Notes (Signed)
Pt is present today with SOB, cough (chest tightness), and chest congestion. Pt states that she took the medication and she still feels the same.

## 2021-02-19 NOTE — Discharge Instructions (Addendum)
Finish prednisone medication for tomorrow   Start antibiotic twice a day for 7 days beginning Wednesday 02/21/21  Can use 91mL of tussionex as bedtime as needed for cough, be mindful this may make you drowsy, if it does not make you too drowsy you can take a dose in the morning    Can continue use of inhalers, allergy medications and the cough medicine you have for daytime use

## 2021-02-20 NOTE — Telephone Encounter (Signed)
Called patient regarding request for Benzonatate for cough, was recently seen at urgent care and the prescription for cough medication was sent to the [facility] and the co-pay was too expensive. Will refill Benzonatate per patient request, patient advised to keep out of reach from children, locked up if possible.   Cindy Nease, DO

## 2021-02-21 ENCOUNTER — Ambulatory Visit: Payer: Medicaid Other

## 2021-02-22 ENCOUNTER — Ambulatory Visit: Payer: Medicaid Other | Admitting: Physical Therapy

## 2021-03-01 ENCOUNTER — Ambulatory Visit: Payer: Medicaid Other | Attending: Family Medicine | Admitting: Rehabilitative and Restorative Service Providers"

## 2021-03-10 ENCOUNTER — Other Ambulatory Visit: Payer: Self-pay | Admitting: Family Medicine

## 2021-03-10 NOTE — Telephone Encounter (Signed)
No longer under prescriber's care.

## 2021-03-28 DIAGNOSIS — F4310 Post-traumatic stress disorder, unspecified: Secondary | ICD-10-CM | POA: Diagnosis not present

## 2021-03-28 DIAGNOSIS — F4320 Adjustment disorder, unspecified: Secondary | ICD-10-CM | POA: Diagnosis not present

## 2021-04-04 DIAGNOSIS — F4320 Adjustment disorder, unspecified: Secondary | ICD-10-CM | POA: Diagnosis not present

## 2021-04-04 DIAGNOSIS — F4310 Post-traumatic stress disorder, unspecified: Secondary | ICD-10-CM | POA: Diagnosis not present

## 2021-04-27 ENCOUNTER — Ambulatory Visit: Payer: Medicaid Other | Admitting: Family Medicine

## 2021-04-27 ENCOUNTER — Other Ambulatory Visit: Payer: Self-pay

## 2021-04-27 VITALS — BP 110/80 | HR 90 | Ht 64.0 in | Wt 196.0 lb

## 2021-04-27 DIAGNOSIS — Z9889 Other specified postprocedural states: Secondary | ICD-10-CM | POA: Diagnosis not present

## 2021-04-27 DIAGNOSIS — R2233 Localized swelling, mass and lump, upper limb, bilateral: Secondary | ICD-10-CM | POA: Diagnosis not present

## 2021-04-27 NOTE — Patient Instructions (Signed)
Thank you for coming in today.  Please call the [facility] to schedule your appointment.   Take Care,   Dr. Susa Simmonds

## 2021-04-27 NOTE — Progress Notes (Signed)
   SUBJECTIVE:   CHIEF COMPLAINT / HPI:   Chief Complaint  Patient presents with   skin     Cindy Howell is a 35 y.o. female here after finding bilateral arm masses. The left sided lump has mostly resolved however the right sided lump has not changed in size. Pt reports scant clear-whitish discharge from nipples. No overlying breast skin changes. No hard masses within breast. Has hx of left breast mass that was removed in 2020.  Denies breast or chest wall pain.    PERTINENT  PMH / PSH: reviewed and updated as appropriate   OBJECTIVE:   BP 110/80   Pulse 90   Ht '5\' 4"'$  (1.626 m)   Wt 196 lb (88.9 kg)   SpO2 99%   BMI 33.64 kg/m    GEN: well appearing female in no acute distress  CVS: well perfused  RESP: speaking in full sentences without pause, no respiratory distress  Breasts: right breast normal without mass, skin or nipple changes or axillary nodes, left breast normal without mass, skin or nipple changes or axillary nodes, abnormal mobile <1cm mass palpable right axillary.    ASSESSMENT/PLAN:   No problem-specific Assessment & Plan notes found for this encounter.  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 Exam significant for <1cm axillary mass on the right. No palable mass in breast. Her last mammogram in Aug 2021 was normal. Given pt's history bilateral breast US with axilla and diagnostic mammogram ordered.   Cindy Hensen, DO PGY-3, [facility] 04/27/2021

## 2021-04-29 DIAGNOSIS — Z9889 Other specified postprocedural states: Secondary | ICD-10-CM | POA: Insufficient documentation

## 2021-05-10 DIAGNOSIS — F4320 Adjustment disorder, unspecified: Secondary | ICD-10-CM | POA: Diagnosis not present

## 2021-05-10 DIAGNOSIS — F4310 Post-traumatic stress disorder, unspecified: Secondary | ICD-10-CM | POA: Diagnosis not present

## 2021-05-23 DIAGNOSIS — F4310 Post-traumatic stress disorder, unspecified: Secondary | ICD-10-CM | POA: Diagnosis not present

## 2021-05-23 DIAGNOSIS — F4320 Adjustment disorder, unspecified: Secondary | ICD-10-CM | POA: Diagnosis not present

## 2021-05-24 DIAGNOSIS — F4310 Post-traumatic stress disorder, unspecified: Secondary | ICD-10-CM | POA: Diagnosis not present

## 2021-05-24 DIAGNOSIS — F4320 Adjustment disorder, unspecified: Secondary | ICD-10-CM | POA: Diagnosis not present

## 2021-05-25 DIAGNOSIS — F4320 Adjustment disorder, unspecified: Secondary | ICD-10-CM | POA: Diagnosis not present

## 2021-05-25 DIAGNOSIS — F4310 Post-traumatic stress disorder, unspecified: Secondary | ICD-10-CM | POA: Diagnosis not present

## 2021-05-28 DIAGNOSIS — F4310 Post-traumatic stress disorder, unspecified: Secondary | ICD-10-CM | POA: Diagnosis not present

## 2021-05-28 DIAGNOSIS — F4320 Adjustment disorder, unspecified: Secondary | ICD-10-CM | POA: Diagnosis not present

## 2021-06-01 ENCOUNTER — Ambulatory Visit
Admission: RE | Admit: 2021-06-01 | Discharge: 2021-06-01 | Disposition: A | Discharge status: Home / Self Care | Payer: Medicaid Other | Source: Ambulatory Visit | Attending: Family Medicine | Admitting: Family Medicine

## 2021-06-01 ENCOUNTER — Other Ambulatory Visit: Payer: Self-pay

## 2021-06-01 DIAGNOSIS — R2233 Localized swelling, mass and lump, upper limb, bilateral: Secondary | ICD-10-CM

## 2021-06-01 DIAGNOSIS — Z9889 Other specified postprocedural states: Secondary | ICD-10-CM

## 2021-06-01 DIAGNOSIS — R922 Inconclusive mammogram: Secondary | ICD-10-CM | POA: Diagnosis not present

## 2021-06-01 DIAGNOSIS — N6489 Other specified disorders of breast: Secondary | ICD-10-CM | POA: Diagnosis not present

## 2021-06-05 DIAGNOSIS — F4320 Adjustment disorder, unspecified: Secondary | ICD-10-CM | POA: Diagnosis not present

## 2021-06-05 DIAGNOSIS — F4310 Post-traumatic stress disorder, unspecified: Secondary | ICD-10-CM | POA: Diagnosis not present

## 2021-06-25 ENCOUNTER — Other Ambulatory Visit: Payer: Self-pay | Admitting: Family Medicine

## 2021-06-26 NOTE — Telephone Encounter (Signed)
Chart indicates this patient uses other pcp at [facility]. Refusing refill request. Please forward to appropriate office.

## 2021-06-28 ENCOUNTER — Other Ambulatory Visit: Payer: Self-pay | Admitting: Family Medicine

## 2021-06-29 NOTE — Telephone Encounter (Addendum)
Pt no longer under prescriber care.  Sent to PCP.  Requested Prescriptions  Pending Prescriptions Disp Refills   montelukast (SINGULAIR) 10 MG tablet [Pharmacy Med Name: Montelukast Sodium 10 MG Oral Tablet] 30 tablet 0    Sig: TAKE 1 TABLET BY MOUTH AT BEDTIME     Pulmonology:  Leukotriene Inhibitors Failed - 06/29/2021  1:32 PM      Failed - Valid encounter within last 12 months    Recent Outpatient Visits   None     Future Appointments             In 3 weeks Lilland, Lorrin Goodell, DO [place], [facility]

## 2021-07-05 ENCOUNTER — Other Ambulatory Visit: Payer: Self-pay

## 2021-07-05 ENCOUNTER — Encounter: Payer: Self-pay | Admitting: Family Medicine

## 2021-07-05 ENCOUNTER — Ambulatory Visit
Admission: RE | Admit: 2021-07-05 | Discharge: 2021-07-05 | Disposition: A | Discharge status: Home / Self Care | Payer: Medicaid Other | Source: Ambulatory Visit | Attending: Family Medicine | Admitting: Family Medicine

## 2021-07-05 ENCOUNTER — Ambulatory Visit (INDEPENDENT_AMBULATORY_CARE_PROVIDER_SITE_OTHER): Payer: Medicaid Other | Admitting: Family Medicine

## 2021-07-05 VITALS — BP 120/80 | HR 85 | Wt 201.6 lb

## 2021-07-05 DIAGNOSIS — M79671 Pain in right foot: Secondary | ICD-10-CM

## 2021-07-05 MED ORDER — BENZONATATE 200 MG PO CAPS: 200.0000 mg | ORAL_CAPSULE | Freq: Three times a day (TID) | ORAL | 0 refills | Status: DC | PRN

## 2021-07-05 MED ORDER — OMEPRAZOLE 20 MG PO CPDR: 20.0000 mg | DELAYED_RELEASE_CAPSULE | Freq: Every day | ORAL | 0 refills | Status: DC

## 2021-07-05 MED ORDER — DICLOFENAC SODIUM 1 % EX GEL: 4.0000 g | Freq: Four times a day (QID) | CUTANEOUS | 0 refills | Status: DC | PRN

## 2021-07-05 NOTE — Patient Instructions (Addendum)
It was nice seeing you today!  You do not need an appointment to get your X-ray done.  [facility] Address: [address], [place], [place] [address] Phone: [phone]   Try Voltaren gel for your foot. Do not use ibuprofen at the same time.  Stay well, Zola Button, MD [place] [phone]

## 2021-07-05 NOTE — Progress Notes (Signed)
    SUBJECTIVE:   CHIEF COMPLAINT / HPI: Foot pain  Patient reports intermittent right foot pain for the past 3 months.  She notices the pain after driving and sometimes after extended periods of walking.  She occasionally takes OTC ibuprofen or Tylenol 2-3 times per week which does relieve the pain.  Pain is sometimes severe enough to interfere with ambulation.  She does not do any sports or regular physical activity.  Additionally, she states she has had cough and congestion.  The congestion has resolved but cough has been persistent for 2 weeks and is requesting a refill of benzonatate.  Also needs refill of omeprazole.  PERTINENT  PMH / PSH: T2DM  OBJECTIVE:   BP 120/80   Pulse 85   Wt 201 lb 9.6 oz (91.4 kg)   SpO2 98%   BMI 34.60 kg/m   General: Obese, NAD CV: Regular rate.  2+ PT and DP pulses. Pulm: CTAB, no wheezes or rales MSK: No obvious deformity or swelling noted of bilateral feet.  There is no tenderness with palpation.  She has full range of motion of bilateral feet without pain.  She endorses some pain with resisted dorsiflexion on the right.  ASSESSMENT/PLAN:   Right foot pain Etiology is unclear at this time, considering stress fracture or tendinitis from overuse.  Will obtain XR imaging to rule out stress fracture.  Treat with topical diclofenac.  Consider sports medicine referral if XR is negative.   Benzonatate and omeprazole refilled as requested.  Zola Button, MD [place]

## 2021-07-06 ENCOUNTER — Telehealth: Payer: Self-pay | Admitting: Family Medicine

## 2021-07-06 DIAGNOSIS — M79671 Pain in right foot: Secondary | ICD-10-CM

## 2021-07-06 NOTE — Telephone Encounter (Signed)
Called patient to discuss normal foot XR results. Will place referral to sports medicine as discussed at appointment.

## 2021-07-13 ENCOUNTER — Ambulatory Visit: Payer: Medicaid Other | Admitting: Family Medicine

## 2021-07-13 NOTE — Progress Notes (Deleted)
   Cindy Howell is a 35 y.o. female who presents to [facility] today for the following:  Right foot pain Last seen by PCP for the same on 10/20 X-rays performed that day that were negative for acute fracture or significant abnormality ***  PMH reviewed.  ROS as above. Medications reviewed.  Exam:  There were no vitals taken for this visit. Gen: Well NAD MSK:  *** Foot: Inspection:  No obvious bony deformity b/l.  No swelling, erythema, or bruising b/l.  Normal arch b/l Palpation: No tenderness to palpation b/l ROM: Full  ROM of the ankle b/l. Normal midfoot flexibility b/l Strength: 5/5 strength ankle in all planes b/l Neurovascular: N/V intact distally in the lower extremity b/l Special tests: Negative anterior drawer. Negative squeeze. normal midfoot flexibility. Normal calcaneal motion with heel raise   X-ray from 10/20 of patient's right foot independently reviewed by this provider that does not show any acute abnormality or significant degenerative changes   Assessment and Plan: 1) No problem-specific Assessment & Plan notes found for this encounter.   [place] Constable, D.O.  PGY-4 [facility]  07/13/2021 9:36 AM

## 2021-07-18 ENCOUNTER — Ambulatory Visit: Payer: Medicaid Other | Admitting: Family Medicine

## 2021-07-18 ENCOUNTER — Ambulatory Visit: Payer: Self-pay

## 2021-07-18 ENCOUNTER — Encounter: Payer: Self-pay | Admitting: Family Medicine

## 2021-07-18 VITALS — BP 124/90 | Ht 64.0 in | Wt 199.0 lb

## 2021-07-18 DIAGNOSIS — M25571 Pain in right ankle and joints of right foot: Secondary | ICD-10-CM

## 2021-07-18 NOTE — Patient Instructions (Signed)
You have an extensor tendinitis of your right foot. Icing 15 minutes at a time 3-4 times a day. Continue the voltaren gel up to 4 times a day topically. Arch binder for support. Can consider physical therapy, a boot if you're struggling but these usually aren't necessary. Follow up with me in 6 weeks for reevaluation.

## 2021-07-18 NOTE — Progress Notes (Signed)
PCP: Rise Patience, DO  Subjective:   HPI: Patient is a 35 y.o. female here for right foot pain.  Patient denies known injury. For about 3 months she's had pain right foot primarily on dorsal aspect but describes some heel pain and plantar pain as well. Typically felt with driving, pushing on pedals in car, with tight shoes. Feels better with voltaren gel. No swelling, bruising.  Past Medical History:  Diagnosis Date   Allergy    goat meat, pollen   Gestational diabetes    Gestational diabetes mellitus (GDM) controlled on oral hypoglycemic drug 10/30/2018   Hx of preeclampsia, prior pregnancy, currently pregnant 10/30/2018   Mixed anxiety and depressive disorder, history of 05/11/2018   Pre-diabetes    Urticaria     Current Outpatient Medications on File Prior to Visit  Medication Sig Dispense Refill   albuterol (VENTOLIN HFA) 108 (90 Base) MCG/ACT inhaler Inhale 1-2 puffs into the lungs every 6 (six) hours as needed for wheezing or shortness of breath. 1 each 0   albuterol (VENTOLIN HFA) 108 (90 Base) MCG/ACT inhaler Inhale 1-2 puffs into the lungs every 6 (six) hours as needed for wheezing or shortness of breath. 18 g 0   benzonatate (TESSALON) 200 MG capsule Take 1 capsule (200 mg total) by mouth 3 (three) times daily as needed for cough. 20 capsule 0   chlorpheniramine-HYDROcodone (TUSSIONEX PENNKINETIC ER) 10-8 MG/5ML SUER Take 5 mLs by mouth at bedtime as needed for cough. 140 mL 0   diclofenac Sodium (VOLTAREN) 1 % GEL Apply 4 g topically 4 (four) times daily as needed. 350 g 0   doxycycline (VIBRAMYCIN) 100 MG capsule Take 1 capsule (100 mg total) by mouth 2 (two) times daily. 14 capsule 0   fexofenadine (ALLEGRA ALLERGY) 60 MG tablet Take 1 tablet (60 mg total) by mouth 2 (two) times daily. 60 tablet 1   fluticasone (FLONASE) 50 MCG/ACT nasal spray Place 2 sprays into both nostrils daily. 16 g 6   ibuprofen (ADVIL) 600 MG tablet Take 1 tablet (600 mg total) by mouth every 8  (eight) hours as needed. 60 tablet 0   levonorgestrel (MIRENA) 20 MCG/24HR IUD 1 each by Intrauterine route once.     montelukast (SINGULAIR) 10 MG tablet TAKE 1 TABLET BY MOUTH AT BEDTIME 30 tablet 0   omeprazole (PRILOSEC) 20 MG capsule Take 1 capsule (20 mg total) by mouth daily. 90 capsule 0   predniSONE (DELTASONE) 20 MG tablet Take 2 tablets (40 mg total) by mouth daily with breakfast. 10 tablet 0   No current facility-administered medications on file prior to visit.    Past Surgical History:  Procedure Laterality Date   BREAST LUMPECTOMY WITH RADIOACTIVE SEED LOCALIZATION Left 07/28/2019   Procedure: LEFT BREAST LUMPECTOMY WITH RADIOACTIVE SEED LOCALIZATION;  Surgeon: Autumn Messing III, MD;  Location: [facility] OR;  Service: General;  Laterality: Left;   WISDOM TOOTH EXTRACTION      Allergies  Allergen Reactions   Beef-Derived Products Rash   Goat-Derived Products Rash   Shrimp [Shellfish Allergy] Rash    BP 124/90   Ht 5\' 4"  (1.626 m)   Wt 199 lb (90.3 kg)   BMI 34.16 kg/m   No flowsheet data found.  No flowsheet data found.      Objective:  Physical Exam:  Gen: NAD, comfortable in exam room  Right foot/ankle: No gross deformity, swelling, ecchymoses FROM ankle, digits without pain TTP over extensor digitorum.  Mild tenderness plantar fascia.  No focal  bony, other tenderness.  Negative ant drawer and Negative talar tilt.   Negative syndesmotic compression, calcaneal squeeze. Thompsons test negative. NV intact distally.   Limited MSK u/s right foot: increased fluid surrounding extensor digitorum.  No ankle effusion.  No cortical irregularity, edema overlying cortex of metatarsals.  Plantar fascia normal thickness.  Assessment & Plan:  1. Right foot pain - consistent with extensor tendinitis/tenosynovitis.  Icing, voltaren gel, arch binder.  Consider physical therapy, cam walker if she's struggling but expect improvement without these.  F/u in 6 weeks.

## 2021-07-24 ENCOUNTER — Encounter: Payer: Self-pay | Admitting: Family Medicine

## 2021-07-24 ENCOUNTER — Other Ambulatory Visit: Payer: Self-pay | Admitting: Family Medicine

## 2021-07-24 ENCOUNTER — Ambulatory Visit: Payer: Medicaid Other | Admitting: Family Medicine

## 2021-07-24 ENCOUNTER — Other Ambulatory Visit: Payer: Self-pay

## 2021-07-24 VITALS — BP 121/78 | HR 101 | Ht 64.0 in | Wt 198.2 lb

## 2021-07-24 DIAGNOSIS — R7303 Prediabetes: Secondary | ICD-10-CM | POA: Diagnosis not present

## 2021-07-24 DIAGNOSIS — B372 Candidiasis of skin and nail: Secondary | ICD-10-CM | POA: Diagnosis not present

## 2021-07-24 DIAGNOSIS — E119 Type 2 diabetes mellitus without complications: Secondary | ICD-10-CM

## 2021-07-24 LAB — POCT GLYCOSYLATED HEMOGLOBIN (HGB A1C): HbA1c, POC (controlled diabetic range): 7.1 % — AB (ref 0.0–7.0)

## 2021-07-24 MED ORDER — NYSTATIN 100000 UNIT/GM EX CREA: 1.0000 "application " | TOPICAL_CREAM | Freq: Two times a day (BID) | CUTANEOUS | 1 refills | Status: DC | PRN

## 2021-07-24 MED ORDER — METFORMIN HCL ER 500 MG PO TB24: 500.0000 mg | ORAL_TABLET | Freq: Every day | ORAL | 1 refills | Status: DC

## 2021-07-24 NOTE — Patient Instructions (Addendum)
Your A1c today was 7.1, we are starting you on a medication called Metformin. I do recommend dietary changes and talking with a nutritionist if you are open to it for diabetes and your weight gain. I am putting in a referral, but it will be up to you to call the nutritionist if you would like to proceed forward with it.   We are going to check your cholesterol and urine today as well.  I want you to follow back up in the next 3 months and we will recheck your hemoglobin A1c and see where you are at.

## 2021-07-24 NOTE — Progress Notes (Signed)
    SUBJECTIVE:   CHIEF COMPLAINT / HPI:   Patient reports that she is overall doing well.  She does note that she would like her cholesterol checked today and wants to see about her A1c.  She thinks that she has been gaining weight steadily since having her daughter in 2020.  She denies any excessive fatigue/tiredness, increased urination, cold/heat sensitivity.  She has never been on medications related to prediabetes or diabetes.  She also has having dryness in her groin folds and says that she was given a cream while she was pregnant that helped her.  She reports that it is itchy but does not have any discoloration.   PERTINENT  PMH / PSH: Reviewed  OBJECTIVE:   BP 121/78   Pulse (!) 101   Ht 5\' 4"  (1.626 m)   Wt 198 lb 3.2 oz (89.9 kg)   SpO2 100%   BMI 34.02 kg/m   Gen: well-appearing, NAD CV: RRR, no m/r/g appreciated, no peripheral edema Pulm: CTAB, no wheezes/crackles GI: soft, non-tender, non-distended  ASSESSMENT/PLAN:   Type 2 diabetes mellitus without complications ([facility]) P4D today was 7.1.  Patient is concerned about weight gain that has been slowly happening since the birth of her daughter. We will call patient in the next few weeks to see how she is tolerating-if not doing well, we will consider switching her to GLP-1 (ozempic). -Microalbumin collected today -Recommended ophthalmology examination -Started metformin ER 500 mg daily -Checking lipid panel today -Amb ref to nutrition (Dr. Jenne Campus)  Candidal infection in groin folds Dry and pruritic groin folds, history consistent with possible candida. Similar to previous presentation when pregnant. - Nystatin cream BID until cleared.   Cindy Howell, [place]

## 2021-07-24 NOTE — Assessment & Plan Note (Addendum)
A1c today was 7.1.  Patient is concerned about weight gain that has been slowly happening since the birth of her daughter. We will call patient in the next few weeks to see how she is tolerating-if not doing well, we will consider switching her to GLP-1 (ozempic). -Microalbumin collected today -Recommended ophthalmology examination -Started metformin ER 500 mg daily -Checking lipid panel today -Amb ref to nutrition (Dr. Jenne Campus)

## 2021-07-25 LAB — LIPID PANEL
Chol/HDL Ratio: 4.5 ratio — ABNORMAL HIGH (ref 0.0–4.4)
Cholesterol, Total: 185 mg/dL (ref 100–199)
HDL: 41 mg/dL (ref >39)
LDL Chol Calc (NIH): 106 mg/dL — ABNORMAL HIGH (ref 0–99)
Triglycerides: 221 mg/dL — ABNORMAL HIGH (ref 0–149)
VLDL Cholesterol Cal: 38 mg/dL (ref 5–40)

## 2021-07-25 LAB — MICROALBUMIN / CREATININE URINE RATIO
Creatinine, Urine: 50.8 mg/dL
Microalb/Creat Ratio: 23 mg/g creat (ref 0–29)
Microalbumin, Urine: 11.8 ug/mL

## 2021-07-27 ENCOUNTER — Other Ambulatory Visit: Payer: Self-pay | Admitting: Family Medicine

## 2021-07-27 MED ORDER — ATORVASTATIN CALCIUM 10 MG PO TABS: 10.0000 mg | ORAL_TABLET | Freq: Every day | ORAL | 3 refills | Status: DC

## 2021-08-02 ENCOUNTER — Telehealth: Payer: Self-pay | Admitting: Family Medicine

## 2021-08-02 NOTE — Telephone Encounter (Signed)
Patient is calling stating the medication she was given for diabetes is making her not hungry and her stomach feel upset. She states the doctor told her to call if this started to happen to have medicine changed. Please advise. Thanks!

## 2021-08-02 NOTE — Telephone Encounter (Signed)
Routed message to PCP. Cindy Howell, CMA  

## 2021-08-07 NOTE — Telephone Encounter (Signed)
Changed appt to 12/7 with Dr. Georgina Peer. Salvatore Marvel, CMA

## 2021-08-22 ENCOUNTER — Ambulatory Visit: Payer: Medicaid Other | Admitting: Pharmacist

## 2021-08-22 ENCOUNTER — Other Ambulatory Visit: Payer: Self-pay

## 2021-08-22 DIAGNOSIS — E119 Type 2 diabetes mellitus without complications: Secondary | ICD-10-CM

## 2021-08-22 MED ORDER — ACCU-CHEK GUIDE W/DEVICE KIT
PACK | 0 refills | Status: DC
Start: 2021-08-22 — End: 2023-02-28

## 2021-08-22 MED ORDER — ACCU-CHEK GUIDE VI STRP: ORAL_STRIP | 12 refills | Status: DC

## 2021-08-22 MED ORDER — ACCU-CHEK SOFTCLIX LANCETS MISC: 12 refills | Status: DC

## 2021-08-22 NOTE — Patient Instructions (Signed)
Miss Leis it was a pleasure seeing you today.   Please do the following:  We are checking your labs today. If they are normal we will start you on a medication called Farxiga 5mg . You will take this medication once daily in the morning before breakfast. Make sure you drink plenty of water and fluids while taking this medication. If you have any questions or if you believe something has occurred because of this change, call me or your doctor to let one of [place] know.  You may check your blood sugars a few times a week in the morning. Your fasting blood sugar should be 80-130.  Continue checking blood sugars at home. It's really important that you record these and bring these in to your next doctor's appointment.  Continue making the lifestyle changes we've discussed together during our visit. Diet and exercise play a significant role in improving your blood sugars.  Follow-up with Dr. Oleh Genin as needed   Hypoglycemia or low blood sugar:   Low blood sugar can happen quickly and may become an emergency if not treated right away.   While this shouldn't happen often, it can be brought upon if you skip a meal or do not eat enough. Also, if your insulin or other diabetes medications are dosed too high, this can cause your blood sugar to go to low.   Warning signs of low blood sugar include: Feeling shaky or dizzy Feeling weak or tired  Excessive hunger Feeling anxious or upset  Sweating even when you aren't exercising  What to do if I experience low blood sugar? Follow the Rule of 15 Check your blood sugar with your meter. If lower than 70, proceed to step 2.  Treat with 15 grams of fast acting carbs which is found in 3-4 glucose tablets. If none are available you can try hard candy, 1 tablespoon of sugar or honey,4 ounces of fruit juice, or 6 ounces of REGULAR soda.  Re-check your sugar in 15 minutes. If it is still below 70, do what you did in step 2 again. If your blood sugar has come back up,  go ahead and eat a snack or small meal made up of complex carbs (ex. Whole grains) and protein at this time to avoid recurrence of low blood sugar.

## 2021-08-22 NOTE — Progress Notes (Signed)
   Subjective:    Patient ID: Cindy Howell, female    DOB: 01/28/1986, 35 y.o.   MRN: [id number]  HPI Patient is a 35 y.o. female who presents for diabetes management. She is in good spirits and presents without assistance. Patient was referred on 08/07/21 and last seen by Primary Care Provider on 07/24/21.  At last office visit patient was initiated on metformin XR 500mg  and reports since then she has had stomach pains and she feels bloated. She stated this morning is the first day she has not taken the medication since initiation, and reports stomach pain is minimized.   Insurance coverage/medication affordability: Medicaid  Current diabetes medications include: metformin XR 500mg  once daily  Patient states that She is taking her medications as prescribed. Patient reports adherence with medications.   Patient does not currently have a glucometer.  Objective:   Labs:   Physical Exam Neurological:     Mental Status: She is alert and oriented to person, place, and time.    Review of Systems  Gastrointestinal:  Positive for abdominal pain. Negative for diarrhea.   Lab Results  Component Value Date   HGBA1C 7.1 (A) 07/24/2021   HGBA1C 6.6 (H) 08/18/2020   HGBA1C 6.4 (A) 04/12/2020    There were no vitals filed for this visit.  Lab Results  Component Value Date   MICRALBCREAT 23 07/24/2021    Lipid Panel     Component Value Date/Time   CHOL 185 07/24/2021 1501   TRIG 221 (H) 07/24/2021 1501   HDL 41 07/24/2021 1501   CHOLHDL 4.5 (H) 07/24/2021 1501   LDLCALC 106 (H) 07/24/2021 1501    Assessment/Plan:   T2DM is controlled based on A1C. Discussed with patient option to discontinue metformin and follow closely with lifestyle management. Patient prefers to take medication at this time. Due to issues with GI upset at this time will avoid GLP1. Will consider SGLT2 pending BMP results. Following instruction patient verbalized understanding of treatment plan.     Discontinue metformin XR 500mg  daily Will start  SGLT2-I Farxiga 5mg   once daily pending results of BMP Extensively discussed pathophysiology of diabetes, dietary effects on blood sugar control, and recommended lifestyle interventions. Counseled on s/sx of and management of hypoglycemia Sent in prescription for glucometer and supplies Next A1C anticipated February 2023.   Patient stated the nystatin cream is not helping and requested PCP send in for Kenalog which she has used in the past and stated worked well. Informed patient I would make PCP aware but was not sure how PCP would proceed.  Follow-up appointment with PCP as needed. Written patient instructions provided.  This appointment required 30 minutes of direct patient care.  Thank you for involving pharmacy to assist in providing this patient's care.

## 2021-08-22 NOTE — Assessment & Plan Note (Signed)
T2DM is controlled based on A1C. Discussed with patient option to discontinue metformin and follow closely with lifestyle management. Patient prefers to take medication at this time. Due to issues with GI upset at this time will avoid GLP1. Will consider SGLT2 pending BMP results. Following instruction patient verbalized understanding of treatment plan.    1. Discontinue metformin XR 500mg  daily 2. Will start SGLT2-I Farxiga 5mg   once daily pending results of BMP 3. Extensively discussed pathophysiology of diabetes, dietary effects on blood sugar control, and recommended lifestyle interventions. 4. Counseled on s/sx of and management of hypoglycemia 5. Sent in prescription for glucometer and supplies 6. Next A1C anticipated February 2023.

## 2021-08-23 LAB — BASIC METABOLIC PANEL
BUN/Creatinine Ratio: 12 (ref 9–23)
BUN: 7 mg/dL (ref 6–20)
CO2: 23 mmol/L (ref 20–29)
Calcium: 9 mg/dL (ref 8.7–10.2)
Chloride: 101 mmol/L (ref 96–106)
Creatinine, Ser: 0.57 mg/dL (ref 0.57–1.00)
Glucose: 127 mg/dL — ABNORMAL HIGH (ref 70–99)
Potassium: 4.1 mmol/L (ref 3.5–5.2)
Sodium: 139 mmol/L (ref 134–144)
eGFR: 123 mL/min/{1.73_m2} (ref >59)

## 2021-08-23 MED ORDER — DAPAGLIFLOZIN PROPANEDIOL 5 MG PO TABS: 5.0000 mg | ORAL_TABLET | Freq: Every day | ORAL | 0 refills | Status: DC

## 2021-08-23 NOTE — Progress Notes (Addendum)
Addendum: Kidney function is normal. Will send in prescription for Farxiga 5mg  once daily. Patient educated on purpose, proper use and potential adverse effects of [place].  Following instruction patient verbalized understanding of treatment plan.

## 2021-08-23 NOTE — Addendum Note (Signed)
Addended by: Hughes Better on: 08/23/2021 09:20 AM   Modules accepted: Orders

## 2021-08-27 ENCOUNTER — Ambulatory Visit: Payer: Medicaid Other | Admitting: Family Medicine

## 2021-08-28 ENCOUNTER — Telehealth: Payer: Self-pay

## 2021-08-30 ENCOUNTER — Other Ambulatory Visit: Payer: Self-pay

## 2021-08-30 MED ORDER — HYDROCORTISONE 1 % EX OINT: 1.0000 "application " | TOPICAL_OINTMENT | Freq: Two times a day (BID) | CUTANEOUS | 0 refills | Status: DC

## 2021-09-04 ENCOUNTER — Ambulatory Visit: Payer: Medicaid Other | Admitting: Family Medicine

## 2021-09-13 ENCOUNTER — Telehealth: Payer: Self-pay | Admitting: *Deleted

## 2021-09-13 NOTE — Telephone Encounter (Signed)
Pt calling wanting dr to call her regarding her appt with pharmacist. Please advise. Harris Kistler Kennon Holter, CMA

## 2021-09-14 DIAGNOSIS — H5213 Myopia, bilateral: Secondary | ICD-10-CM | POA: Diagnosis not present

## 2021-09-14 MED ORDER — OMEPRAZOLE 20 MG PO CPDR
20.0000 mg | DELAYED_RELEASE_CAPSULE | Freq: Every day | ORAL | 0 refills | Status: DC
Start: 2021-09-14 — End: 2021-10-09

## 2021-09-14 NOTE — Telephone Encounter (Signed)
Patient reports that she had a visit with Dr. Georgina Peer and her kidney function was checked and she was supposed to be prescribed a medication (appears to be [place]), but did not hear back and never received a prescription (I can see it in the medication list, but she reports she does not have it). In the meantime, she has been taking her omeprazole and metformin and has been able to tolerate it well for the last few weeks. Discussed with patient that if she would like to proceed with the metformin for now that would be fine and we would recheck her A1c in February and add Farxiga then if needed. Patient preferred this plan and a refill of omeprazole was sent in per request.   Keyasia Jolliff, DO

## 2021-09-27 ENCOUNTER — Other Ambulatory Visit: Payer: Self-pay | Admitting: Family Medicine

## 2021-10-03 ENCOUNTER — Other Ambulatory Visit: Payer: Self-pay | Admitting: Family Medicine

## 2021-10-03 ENCOUNTER — Telehealth: Payer: Self-pay | Admitting: Family Medicine

## 2021-10-03 DIAGNOSIS — M545 Low back pain, unspecified: Secondary | ICD-10-CM

## 2021-10-03 DIAGNOSIS — H5213 Myopia, bilateral: Secondary | ICD-10-CM | POA: Diagnosis not present

## 2021-10-03 MED ORDER — IBUPROFEN 600 MG PO TABS: 600.0000 mg | ORAL_TABLET | Freq: Three times a day (TID) | ORAL | 0 refills | Status: DC | PRN

## 2021-10-03 NOTE — Telephone Encounter (Signed)
Sent ibuprofen in for her.

## 2021-10-03 NOTE — Telephone Encounter (Signed)
-----   Message from [facility], LAT sent at 10/03/2021  4:05 PM EST ----- Regarding: FW: phone message Asking for an Rx for ibuprofen, states OTC strength isn't helping. She will schedule a f/u with you if needed but would like medicine first. Thanks! ----- Message ----- From: Carolyne Littles Sent: 10/03/2021   1:53 PM EST To: Jolinda Croak, LAT Subject: phone message                                  Pt is asking for a call back regarding rt foot pain. She is asking for medication for the pain. But wants a call back to discuss.

## 2021-10-04 ENCOUNTER — Telehealth: Payer: Self-pay

## 2021-10-04 ENCOUNTER — Other Ambulatory Visit (HOSPITAL_COMMUNITY): Payer: Self-pay

## 2021-10-04 DIAGNOSIS — F4320 Adjustment disorder, unspecified: Secondary | ICD-10-CM | POA: Diagnosis not present

## 2021-10-04 DIAGNOSIS — F4310 Post-traumatic stress disorder, unspecified: Secondary | ICD-10-CM | POA: Diagnosis not present

## 2021-10-04 NOTE — Telephone Encounter (Signed)
A Prior Authorization was initiated for this patients FARXIGA 5MG  through CoverMyMeds.   Attached chart notes from 08/22/21.  Key: [id number]

## 2021-10-04 NOTE — Telephone Encounter (Signed)
Prior Auth for patients medication FARXIGA 5MG  approved by [employer] from 10/04/21 to 10/04/22.  Key: [id number]  Patients pharmacy notified. $4 Copay.

## 2021-10-08 ENCOUNTER — Ambulatory Visit: Payer: Medicaid Other | Admitting: Family Medicine

## 2021-10-09 ENCOUNTER — Other Ambulatory Visit: Payer: Self-pay

## 2021-10-09 ENCOUNTER — Encounter: Payer: Self-pay | Admitting: Family Medicine

## 2021-10-09 ENCOUNTER — Ambulatory Visit: Payer: Medicaid Other | Admitting: Family Medicine

## 2021-10-09 DIAGNOSIS — Z6833 Body mass index (BMI) 33.0-33.9, adult: Secondary | ICD-10-CM

## 2021-10-09 DIAGNOSIS — E6609 Other obesity due to excess calories: Secondary | ICD-10-CM | POA: Diagnosis not present

## 2021-10-09 DIAGNOSIS — K219 Gastro-esophageal reflux disease without esophagitis: Secondary | ICD-10-CM | POA: Diagnosis not present

## 2021-10-09 MED ORDER — OMEPRAZOLE 40 MG PO CPDR: 40.0000 mg | DELAYED_RELEASE_CAPSULE | Freq: Every day | ORAL | 0 refills | Status: DC

## 2021-10-09 NOTE — Telephone Encounter (Signed)
FAX REC'D  Prior Auth for patients medication FARXIGA 5MG  approved by [employer] from 10/04/21 to 10/04/22.   Patients pharmacy notified.

## 2021-10-09 NOTE — Patient Instructions (Addendum)
It was nice seeing you today!  Increase omeprazole to 40 mg as needed.  [facility] [phone], [place], [place] [id number]   Follow-up in 6 weeks for abdominal pain.  Stay well, Zola Button, MD [place] [phone]  --  Make sure to check out at the front desk before you leave today.  Please arrive at least 15 minutes prior to your scheduled appointments.  If you had blood work today, I will send you a MyChart message or a letter if results are normal. Otherwise, I will give you a call.  If you had a referral placed, they will call you to set up an appointment. Please give [place] a call if you don't hear back in the next 2 weeks.  If you need additional refills before your next appointment, please call your pharmacy first.

## 2021-10-09 NOTE — Assessment & Plan Note (Signed)
Information given for healthy weight and wellness.

## 2021-10-09 NOTE — Progress Notes (Signed)
SUBJECTIVE:   CHIEF COMPLAINT / HPI:  Chief Complaint  Patient presents with   GI concerns    Stomach pain and heartburn.  Had in the past but seems longer now   Jaw Pain    Patient reports she has had ongoing issues with acid reflux and gas ongoing for the past 3 years.  She states she had similar issues after her first pregnancy which went away after 2 years.  She reports having symptoms multiple times a week, about every other day.  Symptoms are relieved with omeprazole, but she sometimes will have to take a second one.  Sometimes she will take Tums with relief.  Abdominal pain is triggered by eating, noticed more when eating fatty or greasy foods or spicy foods.  She feels that since starting metformin, her symptoms have worsened.  Denies nausea, vomiting, blood in stool.  She also reports jaw pain on the right side after trying to eat a very large chocolate bar a few days ago.  Also asked about what she can do for weight loss.  PERTINENT  PMH / PSH: T2DM, GERD  Patient Care Team: Rise Patience, DO as PCP - General (Family Medicine)   OBJECTIVE:   BP 110/70    Pulse 94    Ht 5\' 4"  (1.626 m)    Wt 196 lb (88.9 kg)    SpO2 99%    BMI 33.64 kg/m   Physical Exam Constitutional:      General: She is not in acute distress. HENT:     Head: Normocephalic and atraumatic.     Mouth/Throat:     Mouth: Mucous membranes are moist.     Pharynx: Oropharynx is clear.  Eyes:     Conjunctiva/sclera: Conjunctivae normal.  Cardiovascular:     Rate and Rhythm: Normal rate and regular rhythm.  Pulmonary:     Effort: Pulmonary effort is normal. No respiratory distress.     Breath sounds: Normal breath sounds.  Abdominal:     General: Bowel sounds are normal.     Palpations: Abdomen is soft.     Tenderness: There is no guarding.     Comments: Minimal epigastric tenderness.  Musculoskeletal:     Cervical back: Neck supple.  Skin:    General: Skin is warm and dry.  Neurological:      Mental Status: She is alert.     Depression screen [facility] 2/9 10/09/2021  Decreased Interest 0  Down, Depressed, Hopeless 0  PHQ - 2 Score 0  Altered sleeping 0  Tired, decreased energy 0  Change in appetite 0  Feeling bad or failure about yourself  0  Trouble concentrating 0  Moving slowly or fidgety/restless 0  Suicidal thoughts 0  PHQ-9 Score 0  Difficult doing work/chores -  Some recent data might be hidden     {Show previous vital signs (optional):23777}    ASSESSMENT/PLAN:   GERD (gastroesophageal reflux disease) Having breakthrough symptoms despite being on omeprazole.  Symptoms are worse after eating fatty foods. - increase omeprazole to 40 mg daily (plan for short-term, goal to reduce) - discussed lifestyle changes, weight loss - f/u 6 weeks  Obesity Information given for healthy weight and wellness.   Jaw pain No oral lesions.  Advised not to take big bites of food.  Advised to use heat or NSAIDs as needed.  Return in about 6 weeks (around 11/20/2021), or if symptoms worsen or fail to improve, for f/u abdominal pain.   Zola Button,  MD [place]

## 2021-10-09 NOTE — Assessment & Plan Note (Signed)
Having breakthrough symptoms despite being on omeprazole.  Symptoms are worse after eating fatty foods. - increase omeprazole to 40 mg daily (plan for short-term, goal to reduce) - discussed lifestyle changes, weight loss - f/u 6 weeks

## 2021-10-10 ENCOUNTER — Encounter: Payer: Self-pay | Admitting: Family Medicine

## 2021-10-10 ENCOUNTER — Ambulatory Visit: Payer: Medicaid Other | Admitting: Family Medicine

## 2021-10-10 VITALS — BP 110/82 | Ht 64.0 in | Wt 196.0 lb

## 2021-10-10 DIAGNOSIS — M25571 Pain in right ankle and joints of right foot: Secondary | ICD-10-CM | POA: Diagnosis not present

## 2021-10-10 MED ORDER — DICLOFENAC SODIUM 1 % EX GEL: 4.0000 g | Freq: Four times a day (QID) | CUTANEOUS | 2 refills | Status: DC | PRN

## 2021-10-10 NOTE — Patient Instructions (Signed)
Do the home exercises at least every other day for the next 6 weeks - these are very important for recovery. Heat as you have been 15 minutes at a time as needed. Topical voltaren gel up to 4 times a day. Consider formal physical therapy if not improving. Otherwise follow up as needed.

## 2021-10-10 NOTE — Progress Notes (Signed)
PCP: Rise Patience, DO  Subjective:   HPI: Patient is a 36 y.o. female here for right foot pain.  11/2 Patient denies known injury. For about 3 months she's had pain right foot primarily on dorsal aspect but describes some heel pain and plantar pain as well. Typically felt with driving, pushing on pedals in car, with tight shoes. Feels better with voltaren gel. No swelling, bruising.  10/10/21: Patient reports she is improved since last visit. Pain in right foot is intermittent. Feels this dorsally, laterally, plantar foot at times including when driving. Worse with prolonged sitting especially if sitting on right leg. No new injuries or trauma. Using topical voltaren gel and heat which both help. Not doing home exercises regularly.  Past Medical History:  Diagnosis Date   Allergy    goat meat, pollen   Gestational diabetes    Gestational diabetes mellitus 07/25/2020   Gestational diabetes mellitus (GDM) controlled on oral hypoglycemic drug 10/30/2018   Hx of preeclampsia, prior pregnancy, currently pregnant 10/30/2018   Mixed anxiety and depressive disorder, history of 05/11/2018   Pre-diabetes    Prediabetes 07/18/2020   TMJ (dislocation of temporomandibular joint), initial encounter 05/07/2019   Urticaria     Current Outpatient Medications on File Prior to Visit  Medication Sig Dispense Refill   Accu-Chek Softclix Lancets lancets Use as instructed to check blood sugars daily 100 each 12   albuterol (VENTOLIN HFA) 108 (90 Base) MCG/ACT inhaler Inhale 1-2 puffs into the lungs every 6 (six) hours as needed for wheezing or shortness of breath. 1 each 0   albuterol (VENTOLIN HFA) 108 (90 Base) MCG/ACT inhaler Inhale 1-2 puffs into the lungs every 6 (six) hours as needed for wheezing or shortness of breath. 18 g 0   atorvastatin (LIPITOR) 10 MG tablet Take 1 tablet (10 mg total) by mouth daily. 30 tablet 3   benzonatate (TESSALON) 200 MG capsule Take 1 capsule (200 mg total) by  mouth 3 (three) times daily as needed for cough. 20 capsule 0   Blood Glucose Monitoring Suppl (ACCU-CHEK GUIDE) w/Device KIT Use as directed to check blood glucose 1 kit 0   chlorpheniramine-HYDROcodone (TUSSIONEX PENNKINETIC ER) 10-8 MG/5ML SUER Take 5 mLs by mouth at bedtime as needed for cough. 140 mL 0   dapagliflozin propanediol (FARXIGA) 5 MG TABS tablet Take 1 tablet (5 mg total) by mouth daily before breakfast. 30 tablet 0   doxycycline (VIBRAMYCIN) 100 MG capsule Take 1 capsule (100 mg total) by mouth 2 (two) times daily. (Patient not taking: Reported on 08/22/2021) 14 capsule 0   fexofenadine (ALLEGRA ALLERGY) 60 MG tablet Take 1 tablet (60 mg total) by mouth 2 (two) times daily. (Patient not taking: Reported on 08/22/2021) 60 tablet 1   fluticasone (FLONASE) 50 MCG/ACT nasal spray Place 2 sprays into both nostrils daily. 16 g 6   glucose blood (ACCU-CHEK GUIDE) test strip Use as instructed to check blood sugars daily 100 each 12   hydrocortisone 1 % ointment APPLY ONE APPLICATION TOPICALLY TWICE DAILY 29 g 0   ibuprofen (ADVIL) 600 MG tablet Take 1 tablet (600 mg total) by mouth every 8 (eight) hours as needed. 90 tablet 0   levonorgestrel (MIRENA) 20 MCG/24HR IUD 1 each by Intrauterine route once.     metFORMIN (GLUCOPHAGE-XR) 500 MG 24 hr tablet Take 1 tablet by mouth once daily with breakfast 90 tablet 3   montelukast (SINGULAIR) 10 MG tablet TAKE 1 TABLET BY MOUTH AT BEDTIME (Patient not taking: Reported  on 08/22/2021) 30 tablet 0   nystatin cream (MYCOSTATIN) Apply topically 2 (two) times daily. 30 g 1   omeprazole (PRILOSEC) 40 MG capsule Take 1 capsule (40 mg total) by mouth daily. 90 capsule 0   predniSONE (DELTASONE) 20 MG tablet Take 2 tablets (40 mg total) by mouth daily with breakfast. (Patient not taking: Reported on 08/22/2021) 10 tablet 0   No current facility-administered medications on file prior to visit.    Past Surgical History:  Procedure Laterality Date   BREAST  LUMPECTOMY WITH RADIOACTIVE SEED LOCALIZATION Left 07/28/2019   Procedure: LEFT BREAST LUMPECTOMY WITH RADIOACTIVE SEED LOCALIZATION;  Surgeon: Jovita Kussmaul, MD;  Location: [facility] OR;  Service: General;  Laterality: Left;   WISDOM TOOTH EXTRACTION      Allergies  Allergen Reactions   Beef-Derived Products Rash   Goat-Derived Products Rash   Shrimp [Shellfish Allergy] Rash    BP 110/82    Ht 5' 4" (1.626 m)    Wt 196 lb (88.9 kg)    BMI 33.64 kg/m   No flowsheet data found.  No flowsheet data found.      Objective:  Physical Exam:  Gen: NAD, comfortable in exam room  Right foot/ankle: No gross deformity, swelling, ecchymoses FROM ankle with normal strength Minimal tenderness medial calcaneus at plantar fascia insertion.  No other tenderness. Negative ant drawer and negative talar tilt.   Negative syndesmotic compression. Thompsons test negative. NV intact distally.  Assessment & Plan:  1. Right foot pain - elements of extensor tendinopathy, plantar fasciitis, and peroneal tendinopathy.  Strengthening exercises reviewed today and stressed their importance.  Voltaren gel, heat.  Consider formal physical therapy if not improving.  F/u prn.

## 2021-10-16 DIAGNOSIS — F4310 Post-traumatic stress disorder, unspecified: Secondary | ICD-10-CM | POA: Diagnosis not present

## 2021-10-16 DIAGNOSIS — F4320 Adjustment disorder, unspecified: Secondary | ICD-10-CM | POA: Diagnosis not present

## 2021-10-19 ENCOUNTER — Ambulatory Visit (HOSPITAL_COMMUNITY)
Admission: EM | Admit: 2021-10-19 | Discharge: 2021-10-19 | Disposition: A | Discharge status: Home / Self Care | Payer: Medicaid Other | Attending: Internal Medicine | Admitting: Internal Medicine

## 2021-10-19 ENCOUNTER — Other Ambulatory Visit: Payer: Self-pay

## 2021-10-19 ENCOUNTER — Encounter (HOSPITAL_COMMUNITY): Payer: Self-pay | Admitting: Emergency Medicine

## 2021-10-19 DIAGNOSIS — J069 Acute upper respiratory infection, unspecified: Secondary | ICD-10-CM

## 2021-10-19 MED ORDER — BENZONATATE 100 MG PO CAPS: 200.0000 mg | ORAL_CAPSULE | Freq: Three times a day (TID) | ORAL | 0 refills | Status: DC | PRN

## 2021-10-19 MED ORDER — ALBUTEROL SULFATE HFA 108 (90 BASE) MCG/ACT IN AERS: 1.0000 | INHALATION_SPRAY | Freq: Four times a day (QID) | RESPIRATORY_TRACT | 0 refills | Status: DC | PRN

## 2021-10-19 NOTE — ED Provider Notes (Signed)
[place]    CSN: [id number] Arrival date & time: 10/19/21  7253      History   Chief Complaint Chief Complaint  Patient presents with   URI    HPI Cindy Howell is a 36 y.o. female comes to the urgent care with 3-day history of subjective fever, nasal congestion, sore throat and a nonproductive cough.  Patient's symptoms started insidiously and has worsened.  She endorses chest tightness with no history of asthma or wheezing.  No nausea, vomiting or diarrhea.  Patient's children have similar symptoms.  Patient is fully vaccinated and boosted for COVID-19 virus.  Patient does not have a history of asthma but has required albuterol inhaler in the past for viral upper respiratory infection symptoms with wheezing. HPI  Past Medical History:  Diagnosis Date   Allergy    goat meat, pollen   Gestational diabetes    Gestational diabetes mellitus 07/25/2020   Gestational diabetes mellitus (GDM) controlled on oral hypoglycemic drug 10/30/2018   Hx of preeclampsia, prior pregnancy, currently pregnant 10/30/2018   Mixed anxiety and depressive disorder, history of 05/11/2018   Pre-diabetes    Prediabetes 07/18/2020   TMJ (dislocation of temporomandibular joint), initial encounter 05/07/2019   Urticaria     Patient Active Problem List   Diagnosis Date Noted   History of breast lump/mass excision 04/29/2021   Type 2 diabetes mellitus without complications ([place]) 66/44/0347   Viral URI with cough 09/04/2020   Anxiety 07/25/2020   Breast pain 06/20/2020   Trapezius muscle strain, left, initial encounter 06/01/2020   Stress at home 04/12/2020   Family history of hyperlipidemia 07/21/2019   Bilateral low back pain without sciatica 02/26/2019   GERD (gastroesophageal reflux disease) 02/09/2019   History of depression 11/02/2018   Melasma 09/24/2018   Allergy to shrimp 05/21/2018   History of pre-eclampsia 05/18/2018   History of gestational diabetes 05/11/2018   Mixed anxiety  and depressive disorder, history of 05/11/2018   Comedonal acne 02/19/2018   Obesity 02/06/2018   Allergic rhinitis 01/23/2018    Past Surgical History:  Procedure Laterality Date   BREAST LUMPECTOMY WITH RADIOACTIVE SEED LOCALIZATION Left 07/28/2019   Procedure: LEFT BREAST LUMPECTOMY WITH RADIOACTIVE SEED LOCALIZATION;  Surgeon: Jovita Kussmaul, MD;  Location: [facility] OR;  Service: General;  Laterality: Left;   WISDOM TOOTH EXTRACTION      OB History     Gravida  3   Para  3   Term  3   Preterm      AB      Living  3      SAB      IAB      Ectopic      Multiple      Live Births  3            Home Medications    Prior to Admission medications   Medication Sig Start Date End Date Taking? Authorizing Provider  Accu-Chek Softclix Lancets lancets Use as instructed to check blood sugars daily 08/22/21   Kinnie Feil, MD  albuterol (VENTOLIN HFA) 108 (90 Base) MCG/ACT inhaler Inhale 1-2 puffs into the lungs every 6 (six) hours as needed for wheezing or shortness of breath. 10/10/20   Vanessa Frio, Ryan, DO  albuterol (VENTOLIN HFA) 108 (90 Base) MCG/ACT inhaler Inhale 1-2 puffs into the lungs every 6 (six) hours as needed for wheezing or shortness of breath. 02/16/21   Volney American, PA-C  atorvastatin (LIPITOR) 10 MG tablet  Take 1 tablet (10 mg total) by mouth daily. 07/27/21   Lilland, Alana, DO  benzonatate (TESSALON) 200 MG capsule Take 1 capsule (200 mg total) by mouth 3 (three) times daily as needed for cough. 07/05/21   Zola Button, MD  Blood Glucose Monitoring Suppl (ACCU-CHEK GUIDE) w/Device KIT Use as directed to check blood glucose 08/22/21   Kinnie Feil, MD  chlorpheniramine-HYDROcodone [facility] PENNKINETIC ER) 10-8 MG/5ML SUER Take 5 mLs by mouth at bedtime as needed for cough. 02/19/21   Hans Eden, NP  dapagliflozin propanediol (FARXIGA) 5 MG TABS tablet Take 1 tablet (5 mg total) by mouth daily before breakfast. 08/23/21   Lind Covert, MD  diclofenac Sodium (VOLTAREN) 1 % GEL Apply 4 g topically 4 (four) times daily as needed. 10/10/21   Hudnall, Sharyn Lull, MD  doxycycline (VIBRAMYCIN) 100 MG capsule Take 1 capsule (100 mg total) by mouth 2 (two) times daily. Patient not taking: Reported on 08/22/2021 02/19/21   Hans Eden, NP  fexofenadine [facility] ALLERGY) 60 MG tablet Take 1 tablet (60 mg total) by mouth 2 (two) times daily. Patient not taking: Reported on 08/22/2021 02/05/21   Lilland, Alana, DO  fluticasone (FLONASE) 50 MCG/ACT nasal spray Place 2 sprays into both nostrils daily. 12/11/20   Lurline Del, DO  glucose blood (ACCU-CHEK GUIDE) test strip Use as instructed to check blood sugars daily 08/22/21   Andrena Mews T, MD  hydrocortisone 1 % ointment APPLY ONE APPLICATION TOPICALLY TWICE DAILY 09/27/21   Lilland, Alana, DO  ibuprofen (ADVIL) 600 MG tablet Take 1 tablet (600 mg total) by mouth every 8 (eight) hours as needed. 10/03/21   Hudnall, Sharyn Lull, MD  levonorgestrel (MIRENA) 20 MCG/24HR IUD 1 each by Intrauterine route once.    [provider]  metFORMIN (GLUCOPHAGE-XR) 500 MG 24 hr tablet Take 1 tablet by mouth once daily with breakfast 09/27/21   Lilland, Alana, DO  montelukast (SINGULAIR) 10 MG tablet TAKE 1 TABLET BY MOUTH AT BEDTIME Patient not taking: Reported on 08/22/2021 07/03/21   Lilland, Alana, DO  nystatin cream (MYCOSTATIN) Apply topically 2 (two) times daily. 07/24/21   Lilland, Alana, DO  omeprazole (PRILOSEC) 40 MG capsule Take 1 capsule (40 mg total) by mouth daily. 10/09/21   Zola Button, MD  predniSONE (DELTASONE) 20 MG tablet Take 2 tablets (40 mg total) by mouth daily with breakfast. Patient not taking: Reported on 08/22/2021 02/16/21   Volney American, PA-C    Family History Family History  Problem Relation Age of Onset   Diabetes Mother    Heart disease Mother    Diabetes Father    Heart disease Father    Kidney disease Father    Kidney disease Brother      Social History Social History   Tobacco Use   Smoking status: Never   Smokeless tobacco: Never  Vaping Use   Vaping Use: Never used  Substance Use Topics   Alcohol use: Never   Drug use: Never     Allergies   Beef-derived products, Goat-derived products, and Shrimp [shellfish allergy]   Review of Systems Review of Systems  Constitutional:  Positive for fever. Negative for chills.  HENT:  Positive for congestion, rhinorrhea and sore throat.   Respiratory:  Positive for cough and chest tightness. Negative for shortness of breath and wheezing.   Cardiovascular: Negative.   Gastrointestinal: Negative.   Neurological:  Negative for headaches.    Physical Exam Triage Vital Signs ED Triage  Vitals  Enc Vitals Group     BP 10/19/21 0923 138/81     Pulse Rate 10/19/21 0923 98     Resp 10/19/21 0923 16     Temp 10/19/21 0923 98.4 F (36.9 C)     Temp Source 10/19/21 0923 Oral     SpO2 10/19/21 0923 98 %     Weight 10/19/21 0924 198 lb (89.8 kg)     Height 10/19/21 0924 '5\' 4"'  (1.626 m)     Head Circumference --      Peak Flow --      Pain Score 10/19/21 0924 0     Pain Loc --      Pain Edu? --      Excl. in [place]? --    No data found.  Updated Vital Signs BP 138/81 (BP Location: Right Arm)    Pulse 98    Temp 98.4 F (36.9 C) (Oral)    Resp 16    Ht '5\' 4"'  (1.626 m)    Wt 89.8 kg    SpO2 98%    BMI 33.99 kg/m   Visual Acuity Right Eye Distance:   Left Eye Distance:   Bilateral Distance:    Right Eye Near:   Left Eye Near:    Bilateral Near:     Physical Exam Vitals and nursing note reviewed.  Constitutional:      General: She is not in acute distress.    Appearance: She is not ill-appearing.  HENT:     Right Ear: Tympanic membrane normal.     Left Ear: Tympanic membrane normal.     Mouth/Throat:     Mouth: Mucous membranes are moist.     Pharynx: Posterior oropharyngeal erythema present.  Cardiovascular:     Rate and Rhythm: Normal rate and regular  rhythm.     Pulses: Normal pulses.     Heart sounds: Normal heart sounds.  Pulmonary:     Effort: Pulmonary effort is normal.     Breath sounds: Normal breath sounds. No wheezing, rhonchi or rales.  Abdominal:     General: Bowel sounds are normal.     Palpations: Abdomen is soft.  Neurological:     Mental Status: She is alert.     UC Treatments / Results  Labs (all labs ordered are listed, but only abnormal results are displayed) Labs Reviewed - No data to display  EKG   Radiology No results found.  Procedures Procedures (including critical care time)  Medications Ordered in UC Medications - No data to display  Initial Impression / Assessment and Plan / UC Course  I have reviewed the triage vital signs and the nursing notes.  Pertinent labs & imaging results that were available during my care of the patient were reviewed by me and considered in my medical decision making (see chart for details).     1.  Viral URI with cough: Humidifier use and vapor rub use is recommended Tessalon Perles as needed for cough Albuterol inhaler as needed for chest tightness Maintain adequate hydration Return to urgent care if symptoms worsen. Final Clinical Impressions(s) / UC Diagnoses   Final diagnoses:  Viral URI with cough     Discharge Instructions      Maintain adequate hydration Use medications as prescribed I expect your symptoms to improve in the coming week Cough is usually the last symptom to resolve Humidifier use and vapor rub helps with nasal congestion and cough Return to urgent care if symptoms worsen.  ED Prescriptions   None    PDMP not reviewed this encounter.   Chase Picket, MD 10/19/21 [phone]

## 2021-10-19 NOTE — Discharge Instructions (Signed)
Maintain adequate hydration Use medications as prescribed I expect your symptoms to improve in the coming week Cough is usually the last symptom to resolve Humidifier use and vapor rub helps with nasal congestion and cough Return to urgent care if symptoms worsen.

## 2021-10-19 NOTE — ED Triage Notes (Signed)
Pt c/o nasal congestion, cough and fever x 3 days.

## 2021-10-22 DIAGNOSIS — F4320 Adjustment disorder, unspecified: Secondary | ICD-10-CM | POA: Diagnosis not present

## 2021-10-22 DIAGNOSIS — F4310 Post-traumatic stress disorder, unspecified: Secondary | ICD-10-CM | POA: Diagnosis not present

## 2021-10-22 NOTE — Progress Notes (Signed)
° ° °  SUBJECTIVE:   CHIEF COMPLAINT / HPI:   Primary symptoms: 36 yo with 10 days of runny nose, sore throat, cough, wheezing, chest tightness, palpitations, congestion, body aches. Denies n/v/d, rash. Patient notes that she had COVID last year, and has had several instances of wheezing, SOB with viral infections since that time. She does not have a history of asthma since childhood, never has had PFTs, but  has had albuterol since this summer. She reports one elevated temp to 100*F. She has DM, previously controlled, November 22 A1c 7%. She has noted palpitations and feeling jittery with very fast heart rate after using albuterol, HR currently 127 bpm. Last albuterol use within last 30 minutes. Sick contacts: unsure Covid test: negative at home Covid vaccination(s): yes 4 shots including booster in November 22  PERTINENT  PMH / PSH: DM  OBJECTIVE:   BP (!) 128/93    Pulse (!) 127    Ht 5\' 4"  (1.626 m)    Wt 197 lb 4 oz (89.5 kg)    SpO2 97%    BMI 33.86 kg/m   Nursing note and vitals reviewed GEN: age-appropriate, south Asian woman, resting comfortably in chair, NAD, ill-appearing, class I obesity.  HEENT: NCAT. PERRLA. Sclera without injection or icterus. MMM. Clear oropharynx.  Neck: Supple. No LAD. Cardiac: Regular rate and rhythm. Normal S1/S2. No murmurs, rubs, or gallops appreciated. 2+ radial pulses. Lungs: Expiratory wheezing present in all lung fields, good air movement. No increased WOB, no accessory muscle usage.  Neuro: AOx3  Ext: no edema Psych: Pleasant and appropriate   ASSESSMENT/PLAN:   Asthma exacerbation, non-allergic, moderate persistent Patient with wheezing in all lung fields despite recent albuterol treatment in setting of likely viral URI. Flu negative. EKG shows sinus tachycardia, due to recent albuterol usage. Patient only has albuterol, no spacer. Will prescribe symbicort for control, spacer given with teaching done. DM2 controlled, will treat with prednisone.  Given no fever and only sx of wheezing, VSS, do not recommend CXR yet. Discussed strict return precautions for dyspnea, signs of worsening infection/pneumonia. Patient will need PFTs when at baseline, recommend she follow up with PCP in a few weeks. - symbicort BID daily - spacer - prednisone 40 mg qd x 5d     Gladys Damme, MD [place]

## 2021-10-23 ENCOUNTER — Ambulatory Visit: Payer: Medicaid Other | Admitting: Family Medicine

## 2021-10-23 ENCOUNTER — Other Ambulatory Visit: Payer: Self-pay

## 2021-10-23 VITALS — BP 128/93 | HR 127 | Ht 64.0 in | Wt 197.2 lb

## 2021-10-23 DIAGNOSIS — J4541 Moderate persistent asthma with (acute) exacerbation: Secondary | ICD-10-CM

## 2021-10-23 DIAGNOSIS — J3089 Other allergic rhinitis: Secondary | ICD-10-CM | POA: Diagnosis not present

## 2021-10-23 DIAGNOSIS — J069 Acute upper respiratory infection, unspecified: Secondary | ICD-10-CM | POA: Diagnosis not present

## 2021-10-23 DIAGNOSIS — J301 Allergic rhinitis due to pollen: Secondary | ICD-10-CM | POA: Diagnosis not present

## 2021-10-23 DIAGNOSIS — R000 Tachycardia, unspecified: Secondary | ICD-10-CM | POA: Diagnosis not present

## 2021-10-23 HISTORY — DX: Moderate persistent asthma with (acute) exacerbation: J45.41

## 2021-10-23 LAB — POCT INFLUENZA A/B
Influenza A, POC: NEGATIVE
Influenza B, POC: NEGATIVE

## 2021-10-23 MED ORDER — BUDESONIDE-FORMOTEROL FUMARATE 80-4.5 MCG/ACT IN AERO: 2.0000 | INHALATION_SPRAY | Freq: Two times a day (BID) | RESPIRATORY_TRACT | 3 refills | Status: DC

## 2021-10-23 MED ORDER — FLUTICASONE PROPIONATE 50 MCG/ACT NA SUSP: 2.0000 | Freq: Every day | NASAL | 6 refills | Status: DC

## 2021-10-23 MED ORDER — BENZONATATE 100 MG PO CAPS: 200.0000 mg | ORAL_CAPSULE | Freq: Three times a day (TID) | ORAL | 0 refills | Status: DC | PRN

## 2021-10-23 MED ORDER — FEXOFENADINE HCL 60 MG PO TABS: 60.0000 mg | ORAL_TABLET | Freq: Two times a day (BID) | ORAL | 1 refills | Status: DC

## 2021-10-23 MED ORDER — PREDNISONE 20 MG PO TABS: 40.0000 mg | ORAL_TABLET | Freq: Every day | ORAL | 0 refills | Status: DC

## 2021-10-23 NOTE — Assessment & Plan Note (Signed)
Patient with wheezing in all lung fields despite recent albuterol treatment in setting of likely viral URI. Flu negative. EKG shows sinus tachycardia, due to recent albuterol usage. Patient only has albuterol, no spacer. Will prescribe symbicort for control, spacer given with teaching done. DM2 controlled, will treat with prednisone. Given no fever and only sx of wheezing, VSS, do not recommend CXR yet. Discussed strict return precautions for dyspnea, signs of worsening infection/pneumonia. Patient will need PFTs when at baseline, recommend she follow up with PCP in a few weeks. - symbicort BID daily - spacer - prednisone 40 mg qd x 5d

## 2021-10-23 NOTE — Patient Instructions (Addendum)
It was a pleasure to see you today!  You have an asthma exacerbation from a viral illness. Better asthma control will help your breathing. Your fast heart rate is from albuterol. For asthma: start using the spacer with your inhalers. Use symbicort once in the morning and once at night. You can use this or albuterol for rescue if you have wheezing, coughing, shortness of breath. Start the steroid today and take once with breakfast for 5 days. If you have trouble breathing, shortness of breath 15-20 mins after using symbicort or albuterol, please go to the emergency department immediately. If you have fevers (temperature above 100.4*F) and continued shortness of breath, please return for evaluation or go to urgent care/emergency department. If you cannot pick up symbicort at the pharmacy due to your insurance, call our office at [phone], and I will send in a similar medication    Be Well,  Dr. Chauncey Reading

## 2021-10-24 ENCOUNTER — Other Ambulatory Visit (HOSPITAL_COMMUNITY): Payer: Self-pay

## 2021-11-07 DIAGNOSIS — F4310 Post-traumatic stress disorder, unspecified: Secondary | ICD-10-CM | POA: Diagnosis not present

## 2021-11-07 DIAGNOSIS — F4320 Adjustment disorder, unspecified: Secondary | ICD-10-CM | POA: Diagnosis not present

## 2021-11-14 DIAGNOSIS — F4310 Post-traumatic stress disorder, unspecified: Secondary | ICD-10-CM | POA: Diagnosis not present

## 2021-11-14 DIAGNOSIS — F4320 Adjustment disorder, unspecified: Secondary | ICD-10-CM | POA: Diagnosis not present

## 2021-11-16 ENCOUNTER — Ambulatory Visit: Payer: Medicaid Other | Admitting: Family Medicine

## 2021-11-16 ENCOUNTER — Encounter: Payer: Self-pay | Admitting: Family Medicine

## 2021-11-16 ENCOUNTER — Other Ambulatory Visit: Payer: Self-pay

## 2021-11-16 VITALS — BP 126/83 | HR 86 | Ht 64.0 in | Wt 199.4 lb

## 2021-11-16 DIAGNOSIS — J4541 Moderate persistent asthma with (acute) exacerbation: Secondary | ICD-10-CM | POA: Diagnosis not present

## 2021-11-16 DIAGNOSIS — Z9889 Other specified postprocedural states: Secondary | ICD-10-CM

## 2021-11-16 DIAGNOSIS — F4310 Post-traumatic stress disorder, unspecified: Secondary | ICD-10-CM | POA: Diagnosis not present

## 2021-11-16 DIAGNOSIS — F4320 Adjustment disorder, unspecified: Secondary | ICD-10-CM | POA: Diagnosis not present

## 2021-11-16 DIAGNOSIS — E119 Type 2 diabetes mellitus without complications: Secondary | ICD-10-CM | POA: Diagnosis not present

## 2021-11-16 LAB — POCT GLYCOSYLATED HEMOGLOBIN (HGB A1C): HbA1c, POC (controlled diabetic range): 7.7 % — AB (ref 0.0–7.0)

## 2021-11-16 MED ORDER — ATORVASTATIN CALCIUM 10 MG PO TABS: 10.0000 mg | ORAL_TABLET | Freq: Every day | ORAL | 3 refills | Status: DC

## 2021-11-16 MED ORDER — DAPAGLIFLOZIN PROPANEDIOL 5 MG PO TABS
5.0000 mg | ORAL_TABLET | Freq: Every day | ORAL | 2 refills | Status: DC
Start: 2021-11-16 — End: 2022-01-03

## 2021-11-16 MED ORDER — DICLOFENAC SODIUM 1 % EX GEL: 4.0000 g | Freq: Four times a day (QID) | CUTANEOUS | 2 refills | Status: DC | PRN

## 2021-11-16 MED ORDER — HYDROCORTISONE 1 % EX OINT: TOPICAL_OINTMENT | CUTANEOUS | 0 refills | Status: DC

## 2021-11-16 MED ORDER — BUDESONIDE-FORMOTEROL FUMARATE 80-4.5 MCG/ACT IN AERO: 2.0000 | INHALATION_SPRAY | Freq: Two times a day (BID) | RESPIRATORY_TRACT | 3 refills | Status: DC

## 2021-11-16 NOTE — Patient Instructions (Addendum)
It was so great seeing you today! Today we discussed the following: ? ?-For the sensation in her left breast, I think it would be appropriate for you to be evaluated by the doctor who did your previous breast procedure. Dr. Ethlyn Gallery office number is [phone], please call his office and see if you are going to require another referral to be seen. ? ?-Your lungs sound great today, I have no concerns.  If you begin having the difficulty with breathing then I would try her albuterol inhaler and see if that helps. ? ?-Your heart rate can become elevated with any type of activity including coughing, if your heart is racing and does not improve over the next 30 minutes and he started having pain then I would get evaluated sooner. ? ?-Your medications have been refilled. ? ?-For your diabetes, your A1c today was 7.7, which is increased from your last 1.  Keep taking your metformin as prescribed, I am sending in the medication Wilder Glade and we will follow-up in the next 3 months to recheck your A1c at that time. ? ?Please make sure to bring any medications you take to your appointments. If you have any questions or concerns please call the office at [phone].  ? ?

## 2021-11-16 NOTE — Assessment & Plan Note (Signed)
Patient concerned about left breast abnormal sensation.  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  Recommended that patient contact his office to see if she can be evaluated.  Patient to contact us back if a new referral is required. ?

## 2021-11-16 NOTE — Progress Notes (Signed)
? ? ?  SUBJECTIVE:  ? ?CHIEF COMPLAINT / HPI:  ? ?Diabetes ?Patient reports has been compliant with the metformin 5 mg daily but it still causes her some stomach upset/gas.  She did not ever pick up the [place]. ? ?Left breast "jittery" ?Patient reports for the last 2 weeks she has had this jitteriness that is "inside the breast" not on her chest.  Last week it was occurring every single day and this week it has occurred about 3 times.  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 ? ?Congested feeling ?Patient reports that when she gets congested she feels like she has difficulty with getting a deep breath.  This has been an issue since she got COVID about a year ago and now occurs every time she starts feeling sick or developing a cough. ? ?Patient also requesting medication refills.  ? ?PERTINENT  PMH / PSH: Reviewed ? ?OBJECTIVE:  ? ?BP 126/83   Pulse 86   Ht 5\' 4"  (1.626 m)   Wt 199 lb 6.4 oz (90.4 kg)   SpO2 100%   BMI 34.23 kg/m?   ?Gen: well-appearing, NAD ?CV: RRR, no m/r/g appreciated, no peripheral edema ?Pulm: CTAB, no wheezes/crackles ?GI: soft, non-tender, non-distended ?Left Breast: post-surgical changes in tissue with some decreased tissue in the upper-inner quadrant ? ?ASSESSMENT/PLAN:  ? ?Type 2 diabetes mellitus without complications ([place]) ?A1c 7.7.  Currently only on metformin XR 500 mg due to minimal tolerance of the medication. ?- Continue metformin XR 500 mg daily ?- Start Farxiga 5 gram daily ?- A1c in 3 months ? ?History of breast lump/mass excision ?Patient concerned about left breast abnormal sensation.  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  Recommended that patient contact his office to see if she can be evaluated.  Patient to contact us back if a new referral is required. ?  ? ? ?Heath Tesler, DO ?[place]  ?

## 2021-11-16 NOTE — Assessment & Plan Note (Signed)
A1c 7.7.  Currently only on metformin XR 500 mg due to minimal tolerance of the medication. ?- Continue metformin XR 500 mg daily ?- Start Farxiga 5 gram daily ?- A1c in 3 months ?

## 2021-11-20 DIAGNOSIS — Z0001 Encounter for general adult medical examination with abnormal findings: Secondary | ICD-10-CM | POA: Diagnosis not present

## 2021-11-20 DIAGNOSIS — L709 Acne, unspecified: Secondary | ICD-10-CM | POA: Diagnosis not present

## 2021-11-20 DIAGNOSIS — Z3040 Encounter for surveillance of contraceptives, unspecified: Secondary | ICD-10-CM | POA: Diagnosis not present

## 2021-11-20 DIAGNOSIS — E119 Type 2 diabetes mellitus without complications: Secondary | ICD-10-CM | POA: Diagnosis not present

## 2021-11-20 DIAGNOSIS — Z139 Encounter for screening, unspecified: Secondary | ICD-10-CM | POA: Diagnosis not present

## 2021-11-20 DIAGNOSIS — Z0000 Encounter for general adult medical examination without abnormal findings: Secondary | ICD-10-CM | POA: Diagnosis not present

## 2021-11-20 DIAGNOSIS — Z113 Encounter for screening for infections with a predominantly sexual mode of transmission: Secondary | ICD-10-CM | POA: Diagnosis not present

## 2021-11-20 DIAGNOSIS — Z6832 Body mass index (BMI) 32.0-32.9, adult: Secondary | ICD-10-CM | POA: Diagnosis not present

## 2021-11-20 DIAGNOSIS — Z818 Family history of other mental and behavioral disorders: Secondary | ICD-10-CM | POA: Diagnosis not present

## 2021-11-28 DIAGNOSIS — F4320 Adjustment disorder, unspecified: Secondary | ICD-10-CM | POA: Diagnosis not present

## 2021-11-28 DIAGNOSIS — F4310 Post-traumatic stress disorder, unspecified: Secondary | ICD-10-CM | POA: Diagnosis not present

## 2021-12-03 ENCOUNTER — Ambulatory Visit: Payer: Medicaid Other

## 2021-12-03 ENCOUNTER — Ambulatory Visit: Payer: Medicaid Other | Attending: Obstetrics & Gynecology

## 2021-12-03 ENCOUNTER — Other Ambulatory Visit: Payer: Self-pay

## 2021-12-03 DIAGNOSIS — Z818 Family history of other mental and behavioral disorders: Secondary | ICD-10-CM

## 2021-12-03 NOTE — Progress Notes (Signed)
?Name: Glendene Wyer Indication: Preconception genetic counseling  ?DOB: 11/25/1985 Age: 36 y.o.   ?EDC: Not currently pregnant LMP: Did not ask Referring Provider:  ?Waymon Amato, MD  ?EGA: Not currently pregnant Genetic Counselor: ?Staci Righter, MS, CGC  ?OB Hx: [id number] Date of Appointment: 12/03/2021  ?Accompanied by: Her reproductive partner Face to Face Time: 94 Minutes  ? ?Family History: A pedigree was created and scanned into Epic under the Media tab. ?Peggy reports her 1 year old son Starla Link MRN [id number]) and her three year old daughter have Autism.  ?Ayven reports her maternal uncle has developmental delays. She reports he has approximately a third grade level of education and looks different from the rest of the family. No records are available for this individual.  ?Family history not remarkable for consanguinity, individuals with birth defects, multiple spontaneous abortions, still births, or unexplained neonatal death.  ?   ?Genetic Counseling:  ? ?Previous Child with Autism Spectrum Disorder. Jaydon reports her 45 year old son Starla Link MRN [id number]) and her three year old daughter have Autism. Autism Spectrum Disorder affects approximately 1-2% of the general population in the [place], [place], and [place]. Autism is a neurological and developmental disorder that affects how people interact with others, communicate, learn, and behave. Autism is known as a "spectrum" disorder because there is wide variation in the type and severity of symptoms people experience. Some interaction/communication behaviors people with Autism may have include making little or inconsistent eye contact, having trouble understanding another person's point of view, difficulties sharing in imaginative play or in making friends, etc. Some restrictive/repetitive behaviors people with Autism may exhibit include having a lasting intense interest in specific topics, being more sensitive or less  sensitive than other people to sensory input, repeating words or phrases, etc. [place] reports her daughter has not been seen by pediatric genetics and has not had any genetic testing for conditions associated with Autism. Her son has been seen by pediatric genetics and has had genetic testing for conditions associated with Autism. Specifically, her son's genetic testing is as follows: ?Negative for Fragile X syndrome: 30 CGG repeats in the FMR1 gene ?Normal microarray: arr(1-22)x2(XY)x1 ?Negative Gene[employer] Autism/ID panel ? ?We discussed that genetic testing for individuals with a clinical diagnosis of Autism yields an explanation in about 20% of cases, and the remaining 80% of cases are left with unknown etiology. After hearing this information, Keali and her partner inquired about the risk for recurrence in a future pregnancy given their son and daughter are affected with Autism of unknown etiology. Genetic counseling reviewed that risk is increased above the general population risk, however, it is difficult to quote given that a genetic cause of both children's Autism as not been identified. We discussed that the recurrence risk could increase up to 50% if either of their children were diagnosed with a dominant genetic condition through future genetic testing. Genetic counseling recommended that Lynee continue to take her son to follow-up appointments with pediatric genetics as recommended by the pediatric genetics team. We also encouraged Lorelai to have her daughter seen by pediatric genetics for an initial consultation.  ? ?In regards to genetic testing, we offered Avonna and her reproductive partner the option of expanded carrier screening for many genetic conditions (with recessive and X-Linked inheritance) in which Autism may be a clinical feature. We also discussed that in a future pregnancy diagnostic testing via chorionic villus sampling ([facility]S) beginning at 11 weeks' gestation or amniocentesis  beginning at 39 weeks'  gestation are also available options. Genetic testing that could be ordered on a [facility] or Amniocentesis sample includes a fetal karyotype, fetal microarray, and testing for specific syndromes. A negative result on these tests would not rule out Autism for the pregnancy as a genetic cause of Ramiya's children's Autism as not been identified. ?  ?  ?Patient Plan: ? ?Proceed with: Genetic counseling recommended that Kandace continue to take her son to follow-up appointments with pediatric genetics as recommended by the pediatric genetics team. We also encouraged Fredi to have her daughter seen by pediatric genetics for an initial consultation. ? ?Declined: Maternal and Paternal Expanded Carrier Screening through [facility] ?  ? ?Thank you for sharing in the care of Alyssa with [place].  ?Please do not hesitate to contact us if you have any questions. ? ?Staci Righter, MS, CGC ?Certified Genetic Counselor ?

## 2021-12-06 DIAGNOSIS — F4310 Post-traumatic stress disorder, unspecified: Secondary | ICD-10-CM | POA: Diagnosis not present

## 2021-12-06 DIAGNOSIS — F4320 Adjustment disorder, unspecified: Secondary | ICD-10-CM | POA: Diagnosis not present

## 2021-12-10 ENCOUNTER — Ambulatory Visit: Payer: Self-pay

## 2021-12-12 DIAGNOSIS — F4310 Post-traumatic stress disorder, unspecified: Secondary | ICD-10-CM | POA: Diagnosis not present

## 2021-12-12 DIAGNOSIS — F4320 Adjustment disorder, unspecified: Secondary | ICD-10-CM | POA: Diagnosis not present

## 2021-12-20 DIAGNOSIS — F4310 Post-traumatic stress disorder, unspecified: Secondary | ICD-10-CM | POA: Diagnosis not present

## 2021-12-20 DIAGNOSIS — F4320 Adjustment disorder, unspecified: Secondary | ICD-10-CM | POA: Diagnosis not present

## 2021-12-27 ENCOUNTER — Other Ambulatory Visit: Payer: Self-pay | Admitting: Family Medicine

## 2021-12-28 DIAGNOSIS — F4320 Adjustment disorder, unspecified: Secondary | ICD-10-CM | POA: Diagnosis not present

## 2021-12-28 DIAGNOSIS — F4310 Post-traumatic stress disorder, unspecified: Secondary | ICD-10-CM | POA: Diagnosis not present

## 2022-01-01 ENCOUNTER — Encounter: Payer: Self-pay | Admitting: Family Medicine

## 2022-01-03 ENCOUNTER — Ambulatory Visit: Payer: Medicaid Other | Admitting: Student

## 2022-01-03 ENCOUNTER — Encounter: Payer: Self-pay | Admitting: Student

## 2022-01-03 VITALS — BP 123/80 | HR 115 | Ht 64.0 in | Wt 192.8 lb

## 2022-01-03 DIAGNOSIS — R000 Tachycardia, unspecified: Secondary | ICD-10-CM | POA: Diagnosis not present

## 2022-01-03 DIAGNOSIS — E119 Type 2 diabetes mellitus without complications: Secondary | ICD-10-CM

## 2022-01-03 MED ORDER — DAPAGLIFLOZIN PROPANEDIOL 10 MG PO TABS: 10.0000 mg | ORAL_TABLET | Freq: Every day | ORAL | 3 refills | Status: DC

## 2022-01-03 NOTE — Assessment & Plan Note (Addendum)
Patient's pulse was elevated to 115-112 this morning says that her pulse is elevated in the morning when she has to take her kids to school.  Says she does feel stressed at that time.  Denies any chest pain currently.  EKG was obtained in the office and showed sinus tachycardia without any ST changes.  Differential includes anxiety given patient has this in her history.  Will obtain TSH, CBC to consider hyperthyroidism/anemia.  ?-Return precautions given for chest pain, PE symptoms, syncope ?

## 2022-01-03 NOTE — Assessment & Plan Note (Addendum)
Fasting levels were elevated to 150s per patient daily without any lows.  Has been taking her [place] and metformin daily.  Will increase her Farxiga to 10 mg daily with follow-up during her A1c visit.  We will continue atorvastatin 10 mg daily. ?-BMP ?-LDL ?

## 2022-01-03 NOTE — Patient Instructions (Addendum)
It was great to see you! Thank you for allowing me to participate in your care!  ? ?Our plans for today:  ?- We got an EKG today and it showed sinus tachycardia ?- We can increase your Farxiga to 10 mg daily ?-We will check your LDL and kidney function, blood levels and thyroid ? ?We are checking some labs today, I will call you if they are abnormal will send you a MyChart message or a letter if they are normal.  If you do not hear about your labs in the next 2 weeks please let us know ? ?Take care and seek immediate care sooner if you develop any concerns. Please remember to show up 15 minutes before your scheduled appointment time! ? ?Gerrit Heck, MD ?[facility]  ?

## 2022-01-03 NOTE — Progress Notes (Signed)
? ? ?  SUBJECTIVE:  ? ?CHIEF COMPLAINT / HPI:  ? ?Type 2 diabetes ?Was started on Farxiga 5 mg daily 11/16/2021.  Last A1c at that time was 7.7 and at [facility] visit 3 days later was 7.8.  Says that her fasting blood sugars have been in the 150s.  She has been taking her metformin as well, does have some indigestion issues but otherwise okay. ? ?Palpitations ?Feels her heart beating fast in morning when she has a lot of stress going on.  Says that she has to take her kids to school and a lot goes on in the mornings.  Says that sometimes she has pain on her left side of the chest that does not radiate anywhere and is tender to touch at that time.  Says that she does not have any chest pain currently.  Denies any shortness of breath or pleuritic chest pain.  Says that she has had a cough since having COVID and has been taking Mucinex for this.  Denies any fevers.  Denies any leg swelling or pain.  Pulse in office today was elevated 112>115.  ? ?HLD ?Patient has been taking atorvastatin 10 mg daily and would like her lipids rechecked today. ? ?PERTINENT  PMH / PSH: T2DM, obesity, anxiety ? ?OBJECTIVE:  ? ?BP 123/80   Pulse (!) 115   Ht '5\' 4"'$  (1.626 m)   Wt 192 lb 12.8 oz (87.5 kg)   SpO2 98%   BMI 33.09 kg/m?   ?General: slightly anxious, awake, alert, responsive to questions ?Head: Normocephalic atraumatic ?CV: Tachycardic rate and regular rhythm no murmurs rubs or gallops, radial pulses 2+ ?Respiratory: Clear to ausculation bilaterally, no wheezes rales or crackles, chest rises symmetrically,  no increased work of breathing; no chest wall tenderness when palption ?Abdomen: Soft, non-tender, non-distended, normoactive bowel sounds  ?Extremities: Moves upper and lower extremities freely, no edema in LE ?Neuro: No focal deficits ?Skin: No rashes or lesions visualized  ? ?ASSESSMENT/PLAN:  ? ?Sinus tachycardia ?Patient's pulse was elevated to 115-112 this morning says that her pulse is elevated in the morning when she has to  take her kids to school.  Says she does feel stressed at that time.  Denies any chest pain currently.  EKG was obtained in the office and showed sinus tachycardia without any ST changes.  Differential includes anxiety given patient has this in her history.  Will obtain TSH, CBC to consider hyperthyroidism/anemia.  ?-Return precautions given for chest pain, PE symptoms, syncope ? ?Type 2 diabetes mellitus without complications ([place]) ?Fasting levels were elevated to 150s per patient daily without any lows.  Has been taking her [place] and metformin daily.  Will increase her Farxiga to 10 mg daily with follow-up during her A1c visit.  We will continue atorvastatin 10 mg daily. ?-BMP ?-LDL ?  ?Gerrit Heck, MD ?[place]  ?

## 2022-01-04 ENCOUNTER — Telehealth: Payer: Self-pay | Admitting: Student

## 2022-01-04 LAB — BASIC METABOLIC PANEL
BUN/Creatinine Ratio: 15 (ref 9–23)
BUN: 10 mg/dL (ref 6–20)
CO2: 23 mmol/L (ref 20–29)
Calcium: 9.9 mg/dL (ref 8.7–10.2)
Chloride: 102 mmol/L (ref 96–106)
Creatinine, Ser: 0.68 mg/dL (ref 0.57–1.00)
Glucose: 130 mg/dL — ABNORMAL HIGH (ref 70–99)
Potassium: 4.4 mmol/L (ref 3.5–5.2)
Sodium: 140 mmol/L (ref 134–144)
eGFR: 117 mL/min/{1.73_m2} (ref >59)

## 2022-01-04 LAB — CBC
Hematocrit: 43.6 % (ref 34.0–46.6)
Hemoglobin: 14.4 g/dL (ref 11.1–15.9)
MCH: 29 pg (ref 26.6–33.0)
MCHC: 33 g/dL (ref 31.5–35.7)
MCV: 88 fL (ref 79–97)
Platelets: 281 10*3/uL (ref 150–450)
RBC: 4.96 x10E6/uL (ref 3.77–5.28)
RDW: 12.8 % (ref 11.7–15.4)
WBC: 8.5 10*3/uL (ref 3.4–10.8)

## 2022-01-04 LAB — TSH: TSH: 2.16 u[IU]/mL (ref 0.450–4.500)

## 2022-01-04 LAB — LDL CHOLESTEROL, DIRECT: LDL Direct: 70 mg/dL (ref 0–99)

## 2022-01-04 NOTE — Telephone Encounter (Signed)
Called patient and informed her of lab results. Said to continue atorvastatin due to T2DM and that her LDL cholesterol is at 70 which is good. TSH, CBC within normal limits and not likely playing into fast heart rate. She says she gets anxious when coming to the office. BMP wnl, increased farxiga to 10 mg given higher fasting levels. Patient to return at next A1c check. Return precautions for is she has chest pain/syncope etc given and patient expressed understanding. ?

## 2022-01-10 DIAGNOSIS — F4310 Post-traumatic stress disorder, unspecified: Secondary | ICD-10-CM | POA: Diagnosis not present

## 2022-01-10 DIAGNOSIS — F4320 Adjustment disorder, unspecified: Secondary | ICD-10-CM | POA: Diagnosis not present

## 2022-01-16 ENCOUNTER — Encounter: Payer: Self-pay | Admitting: Student

## 2022-01-23 DIAGNOSIS — F4310 Post-traumatic stress disorder, unspecified: Secondary | ICD-10-CM | POA: Diagnosis not present

## 2022-01-23 DIAGNOSIS — F4320 Adjustment disorder, unspecified: Secondary | ICD-10-CM | POA: Diagnosis not present

## 2022-02-01 NOTE — Telephone Encounter (Signed)
Sent to pcp for request.

## 2022-02-22 ENCOUNTER — Ambulatory Visit (HOSPITAL_COMMUNITY)
Admission: EM | Admit: 2022-02-22 | Discharge: 2022-02-22 | Disposition: A | Discharge status: Home / Self Care | Payer: Medicaid Other | Attending: Physician Assistant | Admitting: Physician Assistant

## 2022-02-22 ENCOUNTER — Encounter (HOSPITAL_COMMUNITY): Payer: Self-pay

## 2022-02-22 DIAGNOSIS — R030 Elevated blood-pressure reading, without diagnosis of hypertension: Secondary | ICD-10-CM | POA: Diagnosis not present

## 2022-02-22 DIAGNOSIS — R110 Nausea: Secondary | ICD-10-CM

## 2022-02-22 DIAGNOSIS — R42 Dizziness and giddiness: Secondary | ICD-10-CM | POA: Diagnosis not present

## 2022-02-22 MED ORDER — MECLIZINE HCL 12.5 MG PO TABS: 12.5000 mg | ORAL_TABLET | Freq: Three times a day (TID) | ORAL | 0 refills | Status: DC | PRN

## 2022-02-22 NOTE — Discharge Instructions (Signed)
Advised to use meditation and controlled breathing exercises to help control the heart rate. Advised to take the Antivert 25 mg every 6-8 hours to help control the dizziness over the next several days. Advised to check the blood pressure periodically when relaxed and record the readings. Advised to follow-up with PCP or return to urgent care if symptoms fail to improve

## 2022-02-22 NOTE — ED Provider Notes (Signed)
[facility]    CSN: [id number] Arrival date & time: 02/22/22  1407      History   Chief Complaint Chief Complaint  Patient presents with   Numbness   Hypertension   Dizziness   Nausea    HPI Cindy Howell is a 36 y.o. female.   36 year old female presents with dizziness and elevated blood pressure.  Patient relates for the past 4 days she has been having some intermittent dizziness, she relates this is worse when she bends over, or makes a quick movement.  Patient relates she does have some nausea associated with it but she has not had any vomiting.  Patient relates when she is sitting down her symptoms improve.  Patient does relate that symptoms started after she had an upper respiratory infection.  She does not have any fever or chills, no shortness of breath or palpitations. She relates that he has had 1 episode of elevated blood pressure when she checked her blood pressure at [facility].  She is concerned about having high blood pressure.  Patient relates she rechecked her blood pressure again and it was within normal range.  Patient is not having any headaches or vision changes. Patient also relates that she has been having some intermittent numbness of the hands.  Patient does not have any weakness, patient takes care of her 65-year-old and 59-year-old child at home.  Patient patient has not been doing any repetitive activities over the past several weeks.   Hypertension  Dizziness   Past Medical History:  Diagnosis Date   Allergy    goat meat, pollen   Asthma exacerbation, non-allergic, moderate persistent 10/23/2021   Gestational diabetes    Gestational diabetes mellitus 07/25/2020   Gestational diabetes mellitus (GDM) controlled on oral hypoglycemic drug 10/30/2018   Hx of preeclampsia, prior pregnancy, currently pregnant 10/30/2018   Mixed anxiety and depressive disorder, history of 05/11/2018   Pre-diabetes    Prediabetes 07/18/2020   TMJ (dislocation of  temporomandibular joint), initial encounter 05/07/2019   Urticaria     Patient Active Problem List   Diagnosis Date Noted   Sinus tachycardia 01/03/2022   Asthma exacerbation, non-allergic, moderate persistent 10/23/2021   History of breast lump/mass excision 04/29/2021   Type 2 diabetes mellitus without complications ([place]) 30/86/5784   Viral URI with cough 09/04/2020   Anxiety 07/25/2020   Breast pain 06/20/2020   Trapezius muscle strain, left, initial encounter 06/01/2020   Stress at home 04/12/2020   Family history of hyperlipidemia 07/21/2019   Bilateral low back pain without sciatica 02/26/2019   GERD (gastroesophageal reflux disease) 02/09/2019   History of depression 11/02/2018   Melasma 09/24/2018   Allergy to shrimp 05/21/2018   History of pre-eclampsia 05/18/2018   History of gestational diabetes 05/11/2018   Mixed anxiety and depressive disorder, history of 05/11/2018   Comedonal acne 02/19/2018   Obesity 02/06/2018   Allergic rhinitis 01/23/2018    Past Surgical History:  Procedure Laterality Date   BREAST LUMPECTOMY WITH RADIOACTIVE SEED LOCALIZATION Left 07/28/2019   Procedure: LEFT BREAST LUMPECTOMY WITH RADIOACTIVE SEED LOCALIZATION;  Surgeon: Jovita Kussmaul, MD;  Location: [facility] OR;  Service: General;  Laterality: Left;   WISDOM TOOTH EXTRACTION      OB History     Gravida  3   Para  3   Term  3   Preterm      AB      Living  3      SAB  IAB      Ectopic      Multiple      Live Births  3            Home Medications    Prior to Admission medications   Medication Sig Start Date End Date Taking? Authorizing Provider  atorvastatin (LIPITOR) 10 MG tablet Take 1 tablet (10 mg total) by mouth daily. 11/16/21  Yes Lilland, Alana, DO  dapagliflozin propanediol (FARXIGA) 10 MG TABS tablet Take 1 tablet (10 mg total) by mouth daily before breakfast. 01/03/22  Yes Gerrit Heck, MD  meclizine (ANTIVERT) 12.5 MG tablet Take 1 tablet (12.5  mg total) by mouth 3 (three) times daily as needed for dizziness. 02/22/22  Yes Nyoka Lint, PA-C  metFORMIN (GLUCOPHAGE-XR) 500 MG 24 hr tablet Take 1 tablet by mouth once daily with breakfast 09/27/21  Yes Lilland, Alana, DO  Accu-Chek Softclix Lancets lancets Use as instructed to check blood sugars daily 08/22/21   Kinnie Feil, MD  albuterol (VENTOLIN HFA) 108 (90 Base) MCG/ACT inhaler Inhale 1-2 puffs into the lungs every 6 (six) hours as needed for wheezing or shortness of breath. 10/19/21   LampteyMyrene Galas, MD  Blood Glucose Monitoring Suppl (ACCU-CHEK GUIDE) w/Device KIT Use as directed to check blood glucose 08/22/21   Andrena Mews T, MD  budesonide-formoterol (SYMBICORT) 80-4.5 MCG/ACT inhaler Inhale 2 puffs into the lungs 2 (two) times daily. 11/16/21   Lilland, Alana, DO  diclofenac Sodium (VOLTAREN) 1 % GEL Apply 4 g topically 4 (four) times daily as needed. 11/16/21   Lilland, Alana, DO  fexofenadine (ALLEGRA ALLERGY) 60 MG tablet Take 1 tablet (60 mg total) by mouth 2 (two) times daily. 10/23/21   Gladys Damme, MD  fluticasone [facility]) 50 MCG/ACT nasal spray Place 2 sprays into both nostrils daily. 10/23/21   Gladys Damme, MD  glucose blood (ACCU-CHEK GUIDE) test strip Use as instructed to check blood sugars daily 08/22/21   Andrena Mews T, MD  hydrocortisone 1 % ointment APPLY ONE APPLICATION TOPICALLY TWICE DAILY 11/16/21   Lilland, Alana, DO  ibuprofen (ADVIL) 600 MG tablet Take 1 tablet (600 mg total) by mouth every 8 (eight) hours as needed. 10/03/21   Hudnall, Sharyn Lull, MD  levonorgestrel (MIRENA) 20 MCG/24HR IUD 1 each by Intrauterine route once.    [provider]  montelukast (SINGULAIR) 10 MG tablet TAKE 1 TABLET BY MOUTH AT BEDTIME 07/03/21   Lilland, Alana, DO  nystatin cream (MYCOSTATIN) Apply topically 2 (two) times daily. 07/24/21   Lilland, Alana, DO  omeprazole (PRILOSEC) 20 MG capsule Take 1 capsule by mouth once daily 12/27/21   Lilland, Alana, DO   omeprazole (PRILOSEC) 40 MG capsule Take 1 capsule (40 mg total) by mouth daily. 10/09/21   Zola Button, MD  predniSONE (DELTASONE) 20 MG tablet Take 2 tablets (40 mg total) by mouth daily with breakfast. Patient not taking: Reported on 11/16/2021 10/23/21   Gladys Damme, MD    Family History Family History  Problem Relation Age of Onset   Diabetes Mother    Heart disease Mother    Diabetes Father    Heart disease Father    Kidney disease Father    Kidney disease Brother     Social History Social History   Tobacco Use   Smoking status: Never   Smokeless tobacco: Never  Vaping Use   Vaping Use: Never used  Substance Use Topics   Alcohol use: Never   Drug use: Never  Allergies   Beef-derived products, Goat-derived products, and Shrimp [shellfish allergy]   Review of Systems Review of Systems  Neurological:  Positive for dizziness.     Physical Exam Triage Vital Signs ED Triage Vitals  Enc Vitals Group     BP 02/22/22 1533 124/86     Pulse Rate 02/22/22 1533 (!) 115     Resp 02/22/22 1533 16     Temp 02/22/22 1533 98.5 F (36.9 C)     Temp Source 02/22/22 1533 Oral     SpO2 02/22/22 1533 99 %     Weight 02/22/22 1535 190 lb (86.2 kg)     Height 02/22/22 1535 '5\' 4"'  (1.626 m)     Head Circumference --      Peak Flow --      Pain Score 02/22/22 1534 0     Pain Loc --      Pain Edu? --      Excl. in [place]? --    No data found.  Updated Vital Signs BP 125/85 (BP Location: Right Arm)   Pulse 99   Temp 98.5 F (36.9 C) (Oral)   Resp 16   Ht '5\' 4"'  (1.626 m)   Wt 190 lb (86.2 kg)   LMP  (LMP Unknown)   SpO2 99%   BMI 32.61 kg/m   Visual Acuity Right Eye Distance:   Left Eye Distance:   Bilateral Distance:    Right Eye Near:   Left Eye Near:    Bilateral Near:     Physical Exam Constitutional:      Appearance: Normal appearance.  HENT:     Right Ear: Tympanic membrane and ear canal normal.     Left Ear: Tympanic membrane and ear canal  normal.     Mouth/Throat:     Mouth: Mucous membranes are moist.     Pharynx: Oropharynx is clear. Uvula midline.  Cardiovascular:     Rate and Rhythm: Normal rate and regular rhythm.     Heart sounds: Normal heart sounds.  Pulmonary:     Effort: Pulmonary effort is normal.     Breath sounds: Normal breath sounds and air entry. No wheezing, rhonchi or rales.  Lymphadenopathy:     Cervical: No cervical adenopathy.  Neurological:     General: No focal deficit present.     Mental Status: She is alert.     Cranial Nerves: Cranial nerves 2-12 are intact.     Motor: Motor function is intact.     Coordination: Coordination is intact.      UC Treatments / Results  Labs (all labs ordered are listed, but only abnormal results are displayed) Labs Reviewed - No data to display  EKG   Radiology No results found.  Procedures Procedures (including critical care time)  Medications Ordered in UC Medications - No data to display  Initial Impression / Assessment and Plan / UC Course  I have reviewed the triage vital signs and the nursing notes.  Pertinent labs & imaging results that were available during my care of the patient were reviewed by me and considered in my medical decision making (see chart for details).    Plan: 1.  Patient advised to take Antivert 1 every 8 hours to help control the dizziness. 2.  Patient has been advised to check her blood pressure every other day and record the readings after sitting for 5 minutes 3.  Patient is advised to use meditation and slow breathing exercises any to control  her pulse rate. 4.  Patient has been advised to follow-up with PCP about diabetes, return to urgent care as needed Final Clinical Impressions(s) / UC Diagnoses   Final diagnoses:  Nausea  Dizziness  Elevated blood pressure reading     Discharge Instructions      Advised to use meditation and controlled breathing exercises to help control the heart rate. Advised to  take the Antivert 25 mg every 6-8 hours to help control the dizziness over the next several days. Advised to check the blood pressure periodically when relaxed and record the readings. Advised to follow-up with PCP or return to urgent care if symptoms fail to improve    ED Prescriptions     Medication Sig Dispense Auth. Provider   meclizine (ANTIVERT) 12.5 MG tablet Take 1 tablet (12.5 mg total) by mouth 3 (three) times daily as needed for dizziness. 30 tablet Nyoka Lint, PA-C      PDMP not reviewed this encounter.   Nyoka Lint, PA-C 02/22/22 1634

## 2022-02-22 NOTE — ED Triage Notes (Signed)
Patient states she is dizzy, nauseous and having numbness in the left and then right hand.   Patient checked her bp at [facility] and it was high. Only had High BP when having her second child.

## 2022-02-25 ENCOUNTER — Ambulatory Visit: Payer: Medicaid Other | Admitting: Family Medicine

## 2022-03-05 ENCOUNTER — Ambulatory Visit: Payer: Medicaid Other | Admitting: Family Medicine

## 2022-03-05 ENCOUNTER — Encounter: Payer: Self-pay | Admitting: Family Medicine

## 2022-03-05 VITALS — BP 118/78 | HR 105 | Wt 188.0 lb

## 2022-03-05 DIAGNOSIS — R000 Tachycardia, unspecified: Secondary | ICD-10-CM

## 2022-03-05 DIAGNOSIS — K219 Gastro-esophageal reflux disease without esophagitis: Secondary | ICD-10-CM | POA: Diagnosis not present

## 2022-03-05 DIAGNOSIS — E119 Type 2 diabetes mellitus without complications: Secondary | ICD-10-CM | POA: Diagnosis not present

## 2022-03-05 DIAGNOSIS — R42 Dizziness and giddiness: Secondary | ICD-10-CM | POA: Diagnosis not present

## 2022-03-05 LAB — POCT GLYCOSYLATED HEMOGLOBIN (HGB A1C): HbA1c, POC (controlled diabetic range): 7 % (ref 0.0–7.0)

## 2022-03-05 MED ORDER — HYDROCORTISONE 1 % EX OINT
TOPICAL_OINTMENT | CUTANEOUS | 0 refills | Status: DC
Start: 2022-03-05 — End: 2022-10-07

## 2022-03-05 MED ORDER — OMEPRAZOLE 40 MG PO CPDR: 40.0000 mg | DELAYED_RELEASE_CAPSULE | Freq: Every day | ORAL | 0 refills | Status: DC

## 2022-03-05 NOTE — Progress Notes (Signed)
    SUBJECTIVE:   CHIEF COMPLAINT / HPI:   Dizziness Patient notes that she was seen in an urgent care this month due to dizziness. She notes that it occurs when she is standing for long periods of time and will she does not if it originates from her head or is not related to her legs/weakness.  She does not have any dizziness when laying flat or turning her head. She denies any LOC or falls.  She was given meclizine, which she notes does help some but also makes her sleepy.  Anxiety/palpitations Patient notes that when she is in certain situations such as coming to the doctor, completing her morning routine with the children getting ready for school, or after denies she will have her ovaries.  She does not know how to deal with these symptoms.  Diabetes Patient notes compliance with medications. Has no concerns at this time with them and is tolerating them well. Now walking 30-51mnutes most days when her children are at school/camp.  PERTINENT  PMH / PSH: Reviewed  OBJECTIVE:   BP 118/78   Pulse (!) 105   Wt 188 lb (85.3 kg)   LMP  (LMP Unknown)   SpO2 98%   BMI 32.27 kg/m   Gen: well-appearing, NAD CV: RRR, no m/r/g appreciated, no peripheral edema Pulm: CTAB, no wheezes/crackles GI: soft, non-tender, non-distended Neuro: CN II-XII grossly intact with no focal deficits, 5/5 strength of BUE and BLE  Orthostatic VS for the past 24 hrs:  BP- Lying Pulse- Lying BP- Sitting Pulse- Sitting BP- Standing at 0 minutes Pulse- Standing at 0 minutes  03/05/22 1441 126/79 100 125/83 100 122/83 110    ASSESSMENT/PLAN:   GERD (gastroesophageal reflux disease) - Refilled omeprazole '40mg'$  daily PRN - Continue lifestyle changes  Type 2 diabetes mellitus without complications (HCC) AH0Qimproved to 7 on Farxiga '10mg'$  and Metformin '500mg'$  daily.  - Continue current management - Continue lifestyle changes  Sinus tachycardia HR remains elevated during office visits, likely related to  stress/anxiety (which patient acknowledges). Previous work-ups have been negative. Discussed ways of calming down in stressful situations (including home situations). Patient interested in further assistance and therapy.  - Therapy resources given to patient  Dizziness Stable. Currently using meclizine as needed though has side effects of sleepiness. Orthostatic vitals negative. Feel could be related to inadequate hydration with FIranuse. - Trial of increased fluids and compression stockings - Continue to monitor for worsening - Take 1/2 dose of the meclizine as needed - Follow-up in 1 month if no improvement, patient aware of ER precautions     Cindy Howell

## 2022-03-05 NOTE — Patient Instructions (Addendum)
I am not quite clear on what exactly is causing your dizziness as your blood pressures were fine.  There are some things we can try and see if that helps.  This includes using compression stockings and making sure to stay well-hydrated.  Use the meclizine you are given by the urgent care, if you want you can try to cut it in half and see if that works better for you.   Your diabetes is doing much better!  You have had a significant improvement in the A1c today was 7, keep up the good work!     Therapy and Counseling Resources Most providers on this list will take Medicaid. Patients with commercial insurance or Medicare should contact their insurance company to get a list of in network providers.  [facility] (spanish speaking therapist available)(habla espanol)(take medicare and medicaid)  [place], [place], [place] [address], [place] [email or web address] [phone]  [facility] [address] [place], [place] [address] [place], [place], [place] [address]      [phone]  [place] (spanish available) [place], [place] [address] [place] (take [facility] and medicare) [address] [place], [place] [address]       [phone]     [place] (virtual only) [phone]  [facility] [address] [place], [place]  [facility]:  [place]. [place] [phone]  [facility]:  [place] [address] [place] [phone]  [facility] (under & uninsured) [address] [place] [phone]    [email or web address]    [place] [address]  [place]    [phone]  [facility] [place]     Phone:   [phone]     [place]  [place] [address]      [place], [place] ext [place]: [place], [place], [place]   [place] ([place] therapist) [email or web address]  [place] [address]   [place] [address]    [phone]    [facility]   [address],  [place], [place]   [place]  [place]  [facility]  [address] [place], [place]        [phone]  Open Access/Walk In Clinic under & uninsured  [facility]  [address] [place], [place] Crisis [phone]  [facility] [place],  ([place])   [place], [place]: [phone]) 8:30 - 12; 1 - 2:30  [facility] the [place],  [place], [place]    [phone]):8:30 - 12; 2 - 3PM  [facility] [employer],  [address]  [place]; [phone]):   Mon - Fri 8 AM - 5 PM  [facility] [place]  MWF 12:30 to 3:00 or call to schedule an appointment  [phone]  Specific Provider options Psychology Today  [email or web address] click on find a therapist  enter your zip code left side and select or tailor a therapist for  your specific need.   [facility] Provider Directory [email or web address]  (Medicaid)   Follow all drop down to find a provider  [place] or [email or web address] [address], [place], [place] Recovery support and educational   24- Hour Availability:   [facility]  [address] [place], [place] Crisis [phone]  [facility] [employer] [phone]  [place]  [phone]   [place]  [phone] (after hours)  Therapeutic Alternative/Mobile Crisis   [phone]  [place] National Suicide Hotline  [phone] Diamantina Monks)  Call 911 or go to emergency room  [facility]  [phone]);  [place] and [employer]  [phone]); [place], [place], [place], [place], [place], [place], [place]

## 2022-03-05 NOTE — Assessment & Plan Note (Signed)
A1c improved to 7 on Farxiga '10mg'$  and Metformin '500mg'$  daily.  - Continue current management - Continue lifestyle changes

## 2022-03-05 NOTE — Assessment & Plan Note (Addendum)
HR remains elevated during office visits, likely related to stress/anxiety (which patient acknowledges). Previous work-ups have been negative. Discussed ways of calming down in stressful situations (including home situations). Patient interested in further assistance and therapy.  - Therapy resources given to patient

## 2022-03-05 NOTE — Assessment & Plan Note (Signed)
-   Refilled omeprazole '40mg'$  daily PRN - Continue lifestyle changes

## 2022-03-05 NOTE — Assessment & Plan Note (Signed)
Stable. Currently using meclizine as needed though has side effects of sleepiness. Orthostatic vitals negative. Feel could be related to inadequate hydration with [place] use. - Trial of increased fluids and compression stockings - Continue to monitor for worsening - Take 1/2 dose of the meclizine as needed - Follow-up in 1 month if no improvement, patient aware of ER precautions

## 2022-03-06 ENCOUNTER — Ambulatory Visit: Payer: Medicaid Other | Admitting: Family Medicine

## 2022-04-16 ENCOUNTER — Telehealth: Payer: Self-pay

## 2022-04-16 NOTE — Telephone Encounter (Signed)
Patient calls nurse line requesting Diflucan for vaginal yeast.   Patient reports itching and thick white discharge for ~3 days.   Patient reports she has not checked her sugars in over 1 week and unsure of home cbgs.   Patient does have an apt scheduled for ATC tomorrow, however has trouble with transportation.   Patient wanted to reach out to PCP for treatment before "finding a ride."   Will forward to PCP.

## 2022-04-17 ENCOUNTER — Ambulatory Visit: Payer: Medicaid Other

## 2022-04-17 NOTE — Progress Notes (Deleted)
    SUBJECTIVE:   CHIEF COMPLAINT / HPI:   Vaginal Itching -***  PERTINENT  PMH / PSH: ***  OBJECTIVE:   There were no vitals taken for this visit.  ***  ASSESSMENT/PLAN:   No problem-specific Assessment & Plan notes found for this encounter.   Alcus Dad, MD [place]

## 2022-04-17 NOTE — Telephone Encounter (Signed)
Have not treated this patient for yeast before. Advised patient to keep her appointment today to ensure that is what her discharge is. Patient is understanding.    Mery Guadalupe, DO

## 2022-04-29 ENCOUNTER — Other Ambulatory Visit: Payer: Self-pay | Admitting: Family Medicine

## 2022-05-22 ENCOUNTER — Other Ambulatory Visit: Payer: Self-pay | Admitting: Student

## 2022-05-22 DIAGNOSIS — E119 Type 2 diabetes mellitus without complications: Secondary | ICD-10-CM

## 2022-05-25 ENCOUNTER — Other Ambulatory Visit: Payer: Self-pay | Admitting: Family Medicine

## 2022-05-30 ENCOUNTER — Other Ambulatory Visit: Payer: Self-pay | Admitting: Family Medicine

## 2022-06-08 ENCOUNTER — Ambulatory Visit (HOSPITAL_COMMUNITY)
Admission: EM | Admit: 2022-06-08 | Discharge: 2022-06-08 | Disposition: A | Discharge status: Home / Self Care | Payer: Medicaid Other | Attending: Emergency Medicine | Admitting: Emergency Medicine

## 2022-06-08 ENCOUNTER — Encounter (HOSPITAL_COMMUNITY): Payer: Self-pay

## 2022-06-08 DIAGNOSIS — M791 Myalgia, unspecified site: Secondary | ICD-10-CM | POA: Diagnosis not present

## 2022-06-08 DIAGNOSIS — R0789 Other chest pain: Secondary | ICD-10-CM

## 2022-06-08 MED ORDER — CETIRIZINE HCL 10 MG PO TABS: 10.0000 mg | ORAL_TABLET | Freq: Every day | ORAL | 3 refills | Status: DC

## 2022-06-08 MED ORDER — IBUPROFEN 600 MG PO TABS: 600.0000 mg | ORAL_TABLET | Freq: Four times a day (QID) | ORAL | 0 refills | Status: DC | PRN

## 2022-06-08 NOTE — ED Triage Notes (Signed)
Pt reports chest pain x 2-3 days. No other symptoms to note.

## 2022-06-08 NOTE — Discharge Instructions (Signed)
Please follow up with your primary care provider regarding your symptoms.  You can try ibuprofen or tylenol for muscle pain, or apply ice or hot pad to the area  If at any point your symptoms worsen or occur at rest, please go to the emergency department.

## 2022-06-08 NOTE — ED Provider Notes (Signed)
[place]    CSN: [id number] Arrival date & time: 06/08/22  1339      History   Chief Complaint Chief Complaint  Patient presents with   Chest Pain    HPI Cindy Howell is a 36 y.o. female.  Presents with 2 year history of left upper chest wall pain, only with motion Reports she felt it a little more the last 2-3 days Not constant, just with movement especially reaching. Denies symptoms at rest Very intermittent   Denies injury or trauma to the area, although history of breast lumpectomy in 2020 Reports pain on and off since that. Non severe.  Tried tylenol once for it that helped  Was worried it was her heart. Denies any radiation of pain. No other symptoms including shortness of breath, abdominal pain, nausea, etc No history of cardiac problems  Past Medical History:  Diagnosis Date   Allergy    goat meat, pollen   Asthma exacerbation, non-allergic, moderate persistent 10/23/2021   Gestational diabetes    Gestational diabetes mellitus 07/25/2020   Gestational diabetes mellitus (GDM) controlled on oral hypoglycemic drug 10/30/2018   Hx of preeclampsia, prior pregnancy, currently pregnant 10/30/2018   Mixed anxiety and depressive disorder, history of 05/11/2018   Pre-diabetes    Prediabetes 07/18/2020   TMJ (dislocation of temporomandibular joint), initial encounter 05/07/2019   Urticaria     Patient Active Problem List   Diagnosis Date Noted   Dizziness 03/05/2022   Sinus tachycardia 01/03/2022   Asthma exacerbation, non-allergic, moderate persistent 10/23/2021   History of breast lump/mass excision 04/29/2021   Type 2 diabetes mellitus without complications ([place]) 93/23/5573   Anxiety 07/25/2020   Breast pain 06/20/2020   Trapezius muscle strain, left, initial encounter 06/01/2020   Stress at home 04/12/2020   Family history of hyperlipidemia 07/21/2019   Bilateral low back pain without sciatica 02/26/2019   GERD (gastroesophageal reflux disease)  02/09/2019   History of depression 11/02/2018   Melasma 09/24/2018   Allergy to shrimp 05/21/2018   History of pre-eclampsia 05/18/2018   History of gestational diabetes 05/11/2018   Mixed anxiety and depressive disorder, history of 05/11/2018   Comedonal acne 02/19/2018   Obesity 02/06/2018   Allergic rhinitis 01/23/2018    Past Surgical History:  Procedure Laterality Date   BREAST LUMPECTOMY WITH RADIOACTIVE SEED LOCALIZATION Left 07/28/2019   Procedure: LEFT BREAST LUMPECTOMY WITH RADIOACTIVE SEED LOCALIZATION;  Surgeon: Jovita Kussmaul, MD;  Location: [facility] OR;  Service: General;  Laterality: Left;   WISDOM TOOTH EXTRACTION      OB History     Gravida  3   Para  3   Term  3   Preterm      AB      Living  3      SAB      IAB      Ectopic      Multiple      Live Births  3            Home Medications    Prior to Admission medications   Medication Sig Start Date End Date Taking? Authorizing Provider  cetirizine (ZYRTEC) 10 MG tablet Take 1 tablet (10 mg total) by mouth daily. 06/08/22  Yes Ninel Abdella, Wells Guiles, PA-C  ibuprofen (ADVIL) 600 MG tablet Take 1 tablet (600 mg total) by mouth every 6 (six) hours as needed. 06/08/22  Yes Lemoine Goyne, Wells Guiles, PA-C  Accu-Chek Softclix Lancets lancets Use as instructed to check blood sugars daily 08/22/21  Kinnie Feil, MD  albuterol (VENTOLIN HFA) 108 (90 Base) MCG/ACT inhaler Inhale 1-2 puffs into the lungs every 6 (six) hours as needed for wheezing or shortness of breath. 10/19/21   Chase Picket, MD  atorvastatin (LIPITOR) 10 MG tablet Take 1 tablet by mouth once daily 05/27/22   Lilland, Alana, DO  Blood Glucose Monitoring Suppl (ACCU-CHEK GUIDE) w/Device KIT Use as directed to check blood glucose 08/22/21   Andrena Mews T, MD  budesonide-formoterol (SYMBICORT) 80-4.5 MCG/ACT inhaler Inhale 2 puffs into the lungs 2 (two) times daily. 11/16/21   Lilland, Alana, DO  FARXIGA 10 MG TABS tablet TAKE 1 TABLET BY MOUTH ONCE  DAILY BEFORE BREAKFAST 05/24/22   Lilland, Alana, DO  fluticasone (FLONASE) 50 MCG/ACT nasal spray Place 2 sprays into both nostrils daily. 10/23/21   Gladys Damme, MD  glucose blood (ACCU-CHEK GUIDE) test strip Use as instructed to check blood sugars daily 08/22/21   Andrena Mews T, MD  hydrocortisone 1 % ointment APPLY ONE APPLICATION TOPICALLY TWICE DAILY 03/05/22   Lilland, Alana, DO  levonorgestrel (MIRENA) 20 MCG/24HR IUD 1 each by Intrauterine route once.    [provider]  meclizine (ANTIVERT) 12.5 MG tablet Take 1 tablet (12.5 mg total) by mouth 3 (three) times daily as needed for dizziness. 02/22/22   Nyoka Lint, PA-C  metFORMIN (GLUCOPHAGE-XR) 500 MG 24 hr tablet Take 1 tablet by mouth once daily with breakfast 09/27/21   Lilland, Alana, DO  omeprazole (PRILOSEC) 40 MG capsule Take 1 capsule by mouth once daily 05/31/22   Lilland, Lorrin Goodell, DO    Family History Family History  Problem Relation Age of Onset   Diabetes Mother    Heart disease Mother    Diabetes Father    Heart disease Father    Kidney disease Father    Kidney disease Brother     Social History Social History   Tobacco Use   Smoking status: Never    Passive exposure: Never   Smokeless tobacco: Never  Vaping Use   Vaping Use: Never used  Substance Use Topics   Alcohol use: Never   Drug use: Never     Allergies   Beef-derived products, Goat-derived products, and Shrimp [shellfish allergy]   Review of Systems Review of SystemsPer HPI   Physical Exam Triage Vital Signs ED Triage Vitals [06/08/22 1425]  Enc Vitals Group     BP 122/89     Pulse Rate 97     Resp 18     Temp 98.9 F (37.2 C)     Temp Source Oral     SpO2 98 %     Weight      Height      Head Circumference      Peak Flow      Pain Score      Pain Loc      Pain Edu?      Excl. in [place]?    No data found.  Updated Vital Signs BP 122/89 (BP Location: Left Arm)   Pulse 97   Temp 98.9 F (37.2 C) (Oral)   Resp 18    SpO2 98%   Physical Exam Vitals and nursing note reviewed.  Constitutional:      General: She is not in acute distress. HENT:     Mouth/Throat:     Pharynx: Oropharynx is clear.  Cardiovascular:     Rate and Rhythm: Normal rate and regular rhythm.     Pulses: Normal pulses.  Heart sounds: Normal heart sounds.  Pulmonary:     Effort: Pulmonary effort is normal.     Breath sounds: Normal breath sounds.  Chest:     Chest wall: Tenderness present.  Musculoskeletal:     Cervical back: Normal range of motion.     Right lower leg: No edema.     Left lower leg: No edema.  Skin:    General: Skin is warm and dry.  Neurological:     Mental Status: She is alert and oriented to person, place, and time.     UC Treatments / Results  Labs (all labs ordered are listed, but only abnormal results are displayed) Labs Reviewed - No data to display  EKG  Radiology No results found.  Procedures Procedures (including critical care time)  Medications Ordered in UC Medications - No data to display  Initial Impression / Assessment and Plan / UC Course  I have reviewed the triage vital signs and the nursing notes.  Pertinent labs & imaging results that were available during my care of the patient were reviewed by me and considered in my medical decision making (see chart for details).  EKG normal sinus rhythm with ventricular rate of 98 No ischemic changes, unremarkable compared to previous reading in May  Patient reports only intermittent pain, only with motion especially when lifting left arm.  A little tender over the upper chest. Likely muscular, especially since no pains at rest and tenderness to chest wall. Well appearing and in no acute distress I do recommend she follow-up with her primary care provider regarding her symptoms, especially since they have been occurring over the last 2 years. In the meantime can try ibuprofen, Tylenol, hot pad or ice We did discuss cardiac  symptoms to look for and to go to the emergency department with anything worsening Patient agrees to plan  Final Clinical Impressions(s) / UC Diagnoses   Final diagnoses:  Chest wall pain  Muscular pain     Discharge Instructions      Please follow up with your primary care provider regarding your symptoms.  You can try ibuprofen or tylenol for muscle pain, or apply ice or hot pad to the area  If at any point your symptoms worsen or occur at rest, please go to the emergency department.    ED Prescriptions     Medication Sig Dispense Auth. Provider   cetirizine (ZYRTEC) 10 MG tablet Take 1 tablet (10 mg total) by mouth daily. 30 tablet Yanelli Zapanta, PA-C   ibuprofen (ADVIL) 600 MG tablet Take 1 tablet (600 mg total) by mouth every 6 (six) hours as needed. 30 tablet Marvens Hollars, Wells Guiles, PA-C      PDMP not reviewed this encounter.   Les Pou, [place] 06/08/22 1541

## 2022-06-09 ENCOUNTER — Ambulatory Visit (HOSPITAL_COMMUNITY): Payer: Medicaid Other

## 2022-06-12 ENCOUNTER — Ambulatory Visit: Payer: Medicaid Other | Admitting: Family Medicine

## 2022-06-12 ENCOUNTER — Encounter: Payer: Self-pay | Admitting: Family Medicine

## 2022-06-12 VITALS — BP 117/84 | HR 99 | Ht 64.0 in | Wt 187.0 lb

## 2022-06-12 DIAGNOSIS — N760 Acute vaginitis: Secondary | ICD-10-CM

## 2022-06-12 DIAGNOSIS — N644 Mastodynia: Secondary | ICD-10-CM | POA: Diagnosis not present

## 2022-06-12 DIAGNOSIS — R0789 Other chest pain: Secondary | ICD-10-CM

## 2022-06-12 MED ORDER — TERCONAZOLE 0.4 % VA CREA: 1.0000 | TOPICAL_CREAM | Freq: Every day | VAGINAL | 0 refills | Status: DC

## 2022-06-12 NOTE — Patient Instructions (Addendum)
The chest pain you are describing is most consistent with pectoralis minor muscle strain.  This potentially could be related to your prior surgery but I think the best thing to do for it would be to do some rehabilitation exercises which I have included.  I understand your concern regarding the prior breast surgery and I have placed a referral for you to see Dr. Marlou Starks in follow-up just to make sure there is nothing going on there.  Regarding the vaginal itching, I have sent in a prescription for 7 days of intravaginal cream.  Use it at night for 7 days.  If this does not improve/totally resolve, you need to come back for a follow-up appointment where we can do some cultures.  REHAB EXERCISES FOR CHEST WALL PAIN Push ups Stand with feet a little wider than shoulder-width apart. Squat down, placing hands on the floor in front of feet. Walk feet back into plank position, keep body straight and engage the abdominals. Hands should be at least three feet apart. Proper hand placement ensures an emphasis on chest muscles. Inhale, bend elbows, and lower chest until it almost reaches the floor, then exhale and raise body back into the starting position. Do three 15-repetition sets of push ups every other day.

## 2022-06-13 ENCOUNTER — Telehealth: Payer: Self-pay

## 2022-06-13 NOTE — Telephone Encounter (Signed)
A Prior Authorization was initiated for this patients TERCONAZOLE 4% CREAM through CoverMyMeds.   Key: [id number]

## 2022-06-13 NOTE — Progress Notes (Signed)
    CHIEF COMPLAINT / HPI: Left chest pain that is chronic since her lumpectomy in 2020.  She also has some odd sensations still around the nipple area.  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  Chest pain is anterior chest, worse with certain motions.  Not affected by activity or food.  Nonradiating.  No associated symptoms.  #3.  Is having some vaginal itching for the last few weeks.  Mild amount of thin discharge. PERTINENT  PMH / PSH: I have reviewed the patient's medications, allergies, past medical and surgical history, smoking status and updated in the EMR as appropriate.   OBJECTIVE:  BP 117/84   Pulse 99   Ht '5\' 4"'$  (1.626 m)   Wt 187 lb (84.8 kg)   SpO2 99%   BMI 32.10 kg/m  GENERAL: Well-developed female no acute distress CV: Regular rate and rhythm no murmur. LUNGS: Clear to auscultation bilaterally CHEST wall: Pinpoint area at the tenderness junction of the pectoralis minor muscle.  Pectoralis minor isolation active range of motion recreates her pain.  ASSESSMENT / PLAN: #1.  Chest wall pain: Reassured her this is MSK.  Gave her handout on rehabilitation exercises.  Reviewed her prior work-up in the emergency department that was negative for ACS.  #2.  History of lumpectomy.  I told her she may have intermittent odd sensations due to surgical changes.  She would feel much more reassured if she saw the surgeon who did her lumpectomy so I will set her up to see him again.  #3.  Vaginitis, most likely candidal.  We will try Terazol vaginal cream.  If it does not totally resolve symptoms she will return to clinic will need wet prep.  No problem-specific Assessment & Plan notes found for this encounter.   Dorcas Mcmurray MD

## 2022-06-14 NOTE — Telephone Encounter (Signed)
Prior Auth for patients medication TERCONAZOLE 4% CREAM approved by [employer] from 06/13/22 to 06/13/23.  Key: [id number]

## 2022-06-18 ENCOUNTER — Other Ambulatory Visit: Payer: Self-pay | Admitting: Family Medicine

## 2022-06-18 DIAGNOSIS — E119 Type 2 diabetes mellitus without complications: Secondary | ICD-10-CM

## 2022-06-21 ENCOUNTER — Ambulatory Visit: Payer: Medicaid Other | Admitting: Family Medicine

## 2022-06-21 ENCOUNTER — Encounter: Payer: Self-pay | Admitting: Family Medicine

## 2022-06-21 VITALS — BP 120/65 | HR 106 | Ht 64.0 in | Wt 187.8 lb

## 2022-06-21 DIAGNOSIS — R052 Subacute cough: Secondary | ICD-10-CM | POA: Diagnosis not present

## 2022-06-21 DIAGNOSIS — E119 Type 2 diabetes mellitus without complications: Secondary | ICD-10-CM

## 2022-06-21 DIAGNOSIS — N761 Subacute and chronic vaginitis: Secondary | ICD-10-CM | POA: Diagnosis not present

## 2022-06-21 LAB — POCT GLYCOSYLATED HEMOGLOBIN (HGB A1C): HbA1c, POC (controlled diabetic range): 7.1 % — AB (ref 0.0–7.0)

## 2022-06-21 MED ORDER — CETIRIZINE HCL 10 MG PO TABS: 10.0000 mg | ORAL_TABLET | Freq: Every day | ORAL | 3 refills | Status: DC

## 2022-06-21 MED ORDER — TRULICITY 0.75 MG/0.5ML ~~LOC~~ SOAJ: 0.7500 mg | SUBCUTANEOUS | 2 refills | Status: DC

## 2022-06-21 MED ORDER — MONTELUKAST SODIUM 10 MG PO TABS: 10.0000 mg | ORAL_TABLET | Freq: Every day | ORAL | 3 refills | Status: DC

## 2022-06-21 MED ORDER — DAPAGLIFLOZIN PROPANEDIOL 10 MG PO TABS: 10.0000 mg | ORAL_TABLET | Freq: Every day | ORAL | 3 refills | Status: DC

## 2022-06-21 NOTE — Progress Notes (Signed)
    SUBJECTIVE:   CHIEF COMPLAINT / HPI:   T2DM - Checking BG at home: fasting sugars 140-150s  - Medications: Metformin '500mg'$ , Farxiga '10mg'$   - Compliance: good, did not tolerate higher doses of metformin - Diet: average - Exercise: walking most days of the week  - microalbumin: 07/24/2021   - denies symptoms of hypoglycemia, polyuria, polydipsia, numbness extremities, foot ulcers/trauma  Recent COVID - Continues to have cough - Had all vaccinations for COVID- - very mild case with no further concerns  Vaginitis - Currently being treated with Terconazole cream - Minimal improvement noted - Doesn't use lubrication.    PERTINENT  PMH / PSH: Reviewed   OBJECTIVE:   BP 120/65   Pulse (!) 106   Ht '5\' 4"'$  (1.626 m)   Wt 187 lb 12.8 oz (85.2 kg)   SpO2 100%   BMI 32.24 kg/m   Gen: well-appearing, NAD CV: RRR, no m/r/g appreciated, no peripheral edema Pulm: CTAB, no wheezes/crackles  ASSESSMENT/PLAN:   Type 2 diabetes mellitus without complications (HCC) J6G today 7.1; LDL earlier this year at goal on Atorvastatin '10mg'$ . Stable on Farxiga '10mg'$  and Metformin '500mg'$  daily. Given current BMI of 32, discussed GLP-1 with patient, who is agreeable. Will attempt to get approval from insurance.  - Trulicity 0.'75mg'$  weekly, if approved - Continue Farxiga and Metformin - Continue Lipitor    Subacute cough Recent COVID, lung exam reassuringly without consolidation or signs of infection.  Possible confounding factor of recent worsening of allergy season. - Continue allergy medications - Recommend continuing Flonase - No imaging recommended at this time  Subacute vaginitis Still present, only minimally improved by the terconazole cream prescribed previously.  Discussed with patient that did not have time for vaginal exam today and rescheduled patient for clinic visit for next Friday per her preference.  Do wonder if lack of sufficient lubrication could be contributing to symptoms. -  Recommended trial with OTC lubricant - Vaginal exam with wet prep planned for 10/13  Cindy Howell, [place]

## 2022-06-21 NOTE — Patient Instructions (Signed)
We are going to try to get you approved for medication called Trulicity, this is a once a week injection that will help your diabetes.  If we are unable to get this approved, we will try to figure out what other medication changes we can do.  For the vaginal irritation, I made you an appointment for next week to do a vaginal exam.  Keep taking the cream that you are using.  I also recommend using a sexual lubricant during intercourse.  You can use any of them that are over-the-counter, they should be in the pharmacy area likely near the condoms.  Below is the picture of an example of one of them

## 2022-06-21 NOTE — Assessment & Plan Note (Signed)
A1c today 7.1; LDL earlier this year at goal on Atorvastatin '10mg'$ . Stable on Farxiga '10mg'$  and Metformin '500mg'$  daily. Given current BMI of 32, discussed GLP-1 with patient, who is agreeable. Will attempt to get approval from insurance.  - Trulicity 0.'75mg'$  weekly, if approved - Continue Farxiga and Metformin - Continue Lipitor

## 2022-06-27 NOTE — Progress Notes (Deleted)
    SUBJECTIVE:   CHIEF COMPLAINT / HPI:   Vaginal Discharge: Patient is a 36 y.o. female presenting with vaginal discharge for *** days.  She states the discharge is of *** consistency.  She endorses *** vaginal odor.  She is interested in screening for sexually transmitted infections today.  PERTINENT  PMH / PSH: ***None relevant  OBJECTIVE:   There were no vitals taken for this visit.   General: NAD, pleasant, able to participate in exam Respiratory: Normal effort, no obvious respiratory distress Pelvic: VULVA: normal appearing vulva with no masses, tenderness or lesions, VAGINA: Normal appearing vagina with normal color, no lesions, with {GYN VAGINAL DISCHARGE:21986} discharge present, ***CERVIX: No lesions, {GYN VAGINAL DISCHARGE:21986} discharge present,  Chaperone *** present for pelvic exam  ASSESSMENT/PLAN:   No problem-specific Assessment & Plan notes found for this encounter.    Assessment:  36 y.o. female with vaginal discharge for***days, as well as***.  Physical exam significant for*** discharge.  Wet prep performed today shows *** consistent with ***.  Patient is interested in STI screening.   Plan: -Wet prep as above.  Will treat with***. -GC/chlamydia pending -Will check HIV and RPR  Orvis Brill, [place]

## 2022-06-28 ENCOUNTER — Ambulatory Visit: Payer: Self-pay | Admitting: Student

## 2022-07-09 DIAGNOSIS — N644 Mastodynia: Secondary | ICD-10-CM | POA: Diagnosis not present

## 2022-07-12 ENCOUNTER — Other Ambulatory Visit: Payer: Self-pay | Admitting: General Surgery

## 2022-07-12 DIAGNOSIS — N644 Mastodynia: Secondary | ICD-10-CM

## 2022-07-15 ENCOUNTER — Other Ambulatory Visit: Payer: Self-pay | Admitting: Family Medicine

## 2022-07-17 ENCOUNTER — Other Ambulatory Visit: Payer: Self-pay | Admitting: Family Medicine

## 2022-07-24 ENCOUNTER — Ambulatory Visit
Admission: RE | Admit: 2022-07-24 | Discharge: 2022-07-24 | Disposition: A | Discharge status: Home / Self Care | Payer: Medicaid Other | Source: Ambulatory Visit | Attending: General Surgery | Admitting: General Surgery

## 2022-07-24 DIAGNOSIS — N644 Mastodynia: Secondary | ICD-10-CM | POA: Diagnosis not present

## 2022-08-13 ENCOUNTER — Other Ambulatory Visit: Payer: Self-pay | Admitting: Family Medicine

## 2022-09-06 ENCOUNTER — Telehealth: Payer: Self-pay

## 2022-09-06 NOTE — Telephone Encounter (Signed)
Patient calls nurse line regarding reaction to COVID vaccine. She reports receiving COVID booster on 12/20 and that she now has swelling, itching and redness around injection site. Denies fever or spreading of redness/swelling.  She states that she has not had reaction to previous COVID vaccinations. She reports that she had "mild issues with breathing yesterday." This has since resolved. No current chest pain, SHOB or difficulty swallowing.   She is asking if she can use hydrocortisone ointment to help with itching. Spoke with Dr. Andria Frames regarding patient. Supportive measures provided.   Return/ED precautions given.   Talbot Grumbling, RN

## 2022-09-13 ENCOUNTER — Other Ambulatory Visit: Payer: Self-pay | Admitting: Family Medicine

## 2022-10-07 ENCOUNTER — Ambulatory Visit: Payer: Medicaid Other | Admitting: Family Medicine

## 2022-10-07 ENCOUNTER — Encounter: Payer: Self-pay | Admitting: Family Medicine

## 2022-10-07 VITALS — BP 137/80 | HR 107 | Ht 64.0 in | Wt 191.8 lb

## 2022-10-07 DIAGNOSIS — R000 Tachycardia, unspecified: Secondary | ICD-10-CM

## 2022-10-07 DIAGNOSIS — J4541 Moderate persistent asthma with (acute) exacerbation: Secondary | ICD-10-CM

## 2022-10-07 DIAGNOSIS — E119 Type 2 diabetes mellitus without complications: Secondary | ICD-10-CM | POA: Diagnosis not present

## 2022-10-07 LAB — POCT GLYCOSYLATED HEMOGLOBIN (HGB A1C): HbA1c, POC (controlled diabetic range): 6.9 % (ref 0.0–7.0)

## 2022-10-07 MED ORDER — ATORVASTATIN CALCIUM 10 MG PO TABS: 10.0000 mg | ORAL_TABLET | Freq: Every day | ORAL | 2 refills | Status: DC

## 2022-10-07 MED ORDER — MONTELUKAST SODIUM 10 MG PO TABS: 10.0000 mg | ORAL_TABLET | Freq: Every day | ORAL | 3 refills | Status: DC

## 2022-10-07 MED ORDER — DAPAGLIFLOZIN PROPANEDIOL 10 MG PO TABS: 10.0000 mg | ORAL_TABLET | Freq: Every day | ORAL | 3 refills | Status: DC

## 2022-10-07 MED ORDER — OMEPRAZOLE 40 MG PO CPDR: 40.0000 mg | DELAYED_RELEASE_CAPSULE | Freq: Every day | ORAL | 0 refills | Status: DC

## 2022-10-07 MED ORDER — HYDROCORTISONE 1 % EX OINT: TOPICAL_OINTMENT | CUTANEOUS | 0 refills | Status: DC

## 2022-10-07 MED ORDER — FEXOFENADINE HCL 60 MG PO TABS: 60.0000 mg | ORAL_TABLET | Freq: Two times a day (BID) | ORAL | 0 refills | Status: DC

## 2022-10-07 MED ORDER — METFORMIN HCL ER 500 MG PO TB24: 500.0000 mg | ORAL_TABLET | Freq: Every day | ORAL | 3 refills | Status: DC

## 2022-10-07 MED ORDER — BUDESONIDE-FORMOTEROL FUMARATE 80-4.5 MCG/ACT IN AERO: 2.0000 | INHALATION_SPRAY | Freq: Two times a day (BID) | RESPIRATORY_TRACT | 3 refills | Status: DC

## 2022-10-07 NOTE — Patient Instructions (Addendum)
Your A1c today was 6.9, which is great!  Make sure to do your Trulicity injections weekly, if you have any concerns just let our office know.  I recommend that you contact the ophthalmologist office to get your diabetic eye exam done, please make sure to mention that you do need a diabetic eye exam specifically.  For the intermittent sick symptoms, you can still use the Mucinex as needed.  Make sure that you are using your inhalers as prescribed.  Next I have sent in for your allergy medication to be switched to Allegra, if this is not covered to let our office know and I can see if another medication would be covered.

## 2022-10-07 NOTE — Progress Notes (Signed)
    SUBJECTIVE:   CHIEF COMPLAINT / HPI:   T2DM - Medications: atorvastatin '10mg'$  daily, metformin '500mg'$  daily, Trulicity 0.'75mg'$  weekly - Compliance: good, hasn't started Trulicity yet as she wanted to do the first injection in the office - eye exam: discussed with patient - foot exam: to be done today - microalbumin: to be done today (last done in 2022) - denies symptoms of hypoglycemia, polyuria, polydipsia, numbness extremities, foot ulcers/trauma  Intermittent sick symptoms - Had episodes of body aches accompanied by sore throat (children are in school) - has cough when waking up in the morning - uses Mucinex as needed  PERTINENT  PMH / PSH: Reviewed  OBJECTIVE:   BP 137/80   Pulse (!) 107   Ht '5\' 4"'$  (1.626 m)   Wt 191 lb 12.8 oz (87 kg)   SpO2 100%   BMI 32.92 kg/m   Gen: well-appearing, NAD CV: RRR, no m/r/g appreciated, no peripheral edema Pulm: CTAB, no wheezes/crackles  ASSESSMENT/PLAN:   Type 2 diabetes mellitus without complications (HCC) V7C today 6.9. Patient did not yet start Trulicity due to apprehension of the injection at home.  - continue [place] '10mg'$  daily, Metformin '500mg'$  daily, atorvastatin '10mg'$  daily - Trulicity 0.'75mg'$  administered with pharmacy team in clinic today - Encouraged eye exam and gave ophthalmology resources - Urine microalbumin today - Foot exam done today - 3 month follow-up   Intermittent cough and sick symptoms Overall reassuring examination, wonder if patient is intermittently experiencing sick symptoms due to short course viral illnesses with her children being in school. Do feel there is some component of asthma and allergies contributing - Continue current inhalers - Switch zyrtec to allegra (d/t patient drowsiness)  Rise Patience, [place]

## 2022-10-07 NOTE — Progress Notes (Signed)
Asked by Dr. Oleh Genin to provide education on administration of Trulicity. Patient educated on purpose, proper use and potential adverse effects of nausea.  Following instruction patient verbalized understanding of treatment plan and demonstrated proper technique while administering first dose in office.

## 2022-10-07 NOTE — Assessment & Plan Note (Addendum)
A1c today 6.9. Patient did not yet start Trulicity due to apprehension of the injection at home.  - continue [place] '10mg'$  daily, Metformin '500mg'$  daily, atorvastatin '10mg'$  daily - Trulicity 0.'75mg'$  administered with pharmacy team in clinic today - Encouraged eye exam and gave ophthalmology resources - Urine microalbumin today - Foot exam done today - 3 month follow-up

## 2022-10-08 ENCOUNTER — Encounter: Payer: Self-pay | Admitting: Family Medicine

## 2022-10-08 LAB — LIPID PANEL
Chol/HDL Ratio: 3.4 ratio (ref 0.0–4.4)
Cholesterol, Total: 134 mg/dL (ref 100–199)
HDL: 39 mg/dL — ABNORMAL LOW (ref >39)
LDL Chol Calc (NIH): 63 mg/dL (ref 0–99)
Triglycerides: 190 mg/dL — ABNORMAL HIGH (ref 0–149)
VLDL Cholesterol Cal: 32 mg/dL (ref 5–40)

## 2022-10-08 LAB — MICROALBUMIN / CREATININE URINE RATIO
Creatinine, Urine: 13.5 mg/dL
Microalb/Creat Ratio: 72 mg/g creat — ABNORMAL HIGH (ref 0–29)
Microalbumin, Urine: 9.7 ug/mL

## 2022-10-09 ENCOUNTER — Other Ambulatory Visit: Payer: Self-pay | Admitting: Family Medicine

## 2022-10-09 MED ORDER — LEVOCETIRIZINE DIHYDROCHLORIDE 5 MG PO TABS: 5.0000 mg | ORAL_TABLET | Freq: Every evening | ORAL | 3 refills | Status: DC

## 2022-10-11 ENCOUNTER — Other Ambulatory Visit: Payer: Self-pay | Admitting: Family Medicine

## 2022-10-11 DIAGNOSIS — E119 Type 2 diabetes mellitus without complications: Secondary | ICD-10-CM

## 2022-10-11 NOTE — Progress Notes (Signed)
Order placed for nutritionist per patient request. Will refer to Dr. Jenne Campus, patient provided phone number to reach out and make appointment.   Oskar Cretella, DO

## 2022-10-14 ENCOUNTER — Telehealth: Payer: Self-pay

## 2022-10-14 ENCOUNTER — Other Ambulatory Visit: Payer: Self-pay | Admitting: Family Medicine

## 2022-10-14 NOTE — Telephone Encounter (Signed)
Rec'd PA request for patients Fexofenadine '60mg'$  tabs (Allegra). This medication is non preferred and OTC.  Other preferred medications:

## 2022-10-15 NOTE — Telephone Encounter (Signed)
Patient scheduled for tomorrow

## 2022-10-16 ENCOUNTER — Encounter: Payer: Self-pay | Admitting: Student

## 2022-10-16 ENCOUNTER — Other Ambulatory Visit (HOSPITAL_COMMUNITY)
Admission: RE | Admit: 2022-10-16 | Discharge: 2022-10-16 | Disposition: A | Discharge status: Home / Self Care | Payer: Medicaid Other | Source: Ambulatory Visit | Attending: Family Medicine | Admitting: Family Medicine

## 2022-10-16 ENCOUNTER — Ambulatory Visit: Payer: Medicaid Other | Admitting: Student

## 2022-10-16 ENCOUNTER — Other Ambulatory Visit: Payer: Self-pay | Admitting: Family Medicine

## 2022-10-16 VITALS — BP 132/78 | HR 110 | Wt 184.0 lb

## 2022-10-16 DIAGNOSIS — B3731 Acute candidiasis of vulva and vagina: Secondary | ICD-10-CM | POA: Diagnosis not present

## 2022-10-16 DIAGNOSIS — N898 Other specified noninflammatory disorders of vagina: Secondary | ICD-10-CM | POA: Diagnosis not present

## 2022-10-16 DIAGNOSIS — R052 Subacute cough: Secondary | ICD-10-CM | POA: Diagnosis not present

## 2022-10-16 DIAGNOSIS — E119 Type 2 diabetes mellitus without complications: Secondary | ICD-10-CM

## 2022-10-16 LAB — POCT WET PREP (WET MOUNT)
Clue Cells Wet Prep Whiff POC: NEGATIVE
Trichomonas Wet Prep HPF POC: ABSENT

## 2022-10-16 MED ORDER — FLUCONAZOLE 150 MG PO TABS: 150.0000 mg | ORAL_TABLET | Freq: Once | ORAL | 0 refills | Status: AC

## 2022-10-16 MED ORDER — LOSARTAN POTASSIUM 25 MG PO TABS: 25.0000 mg | ORAL_TABLET | Freq: Every day | ORAL | 0 refills | Status: DC

## 2022-10-16 NOTE — Assessment & Plan Note (Signed)
Microalbumin creatinine ratio mildly elevated at last check.  Patient would like to start an additional renal protective drug.  She is already on farxiga which I discussed is helping her and that we will check annually but she would like to start losartan.  I believe her blood pressure would handle this -Losartan 25 mg started today -2-week follow-up consider BMP at this time + BP recheck

## 2022-10-16 NOTE — Progress Notes (Signed)
    SUBJECTIVE:   CHIEF COMPLAINT / HPI:   Vaginal Itching: Patient is a 37 y.o. female presenting with vaginal discharge for 6 months.  She states she does not have any vaginal discharge.  She is interested in screening for sexually transmitted infections today.   Cough: Patient says that she has a cough in the mornings usually.  She said that she had previously had COVID and still has a lingering cough in the mornings.  She says she does not have any acidic taste in her mouth but she is not taking her omeprazole regularly. No fevers or systemic symptoms.  Question about labs: She saw that she had an elevated urine microalbumin creatinine ratio what this means and what she can do.  PERTINENT  PMH / PSH: None relevant  OBJECTIVE:   BP 132/78   Pulse (!) 110   Wt 184 lb (83.5 kg)   SpO2 98%   BMI 31.58 kg/m    General: NAD, pleasant, able to participate in exam Respiratory: Normal effort, no obvious respiratory distress, CTAB no w/r/c Pelvic: VULVA: normal appearing vulva with no masses, tenderness or lesions, VAGINA: Normal appearing vagina with normal color, no lesions, with scant discharge present, CERVIX: No lesions, scant discharge present  Chaperone Dorna Bloom CMA present for pelvic exam  ASSESSMENT/PLAN:   Subacute cough Discussed with patient to start taking her PPI regularly to see if this may help with her morning cough.  Also discussed following up with PCP in a couple weeks to see if this is improving.  Does appear to be moving air well and no wheezes on examination to make me think of an asthma exacerbation -Refilled PPI  Type 2 diabetes mellitus without complications (HCC) Microalbumin creatinine ratio mildly elevated at last check.  Patient would like to start an additional renal protective drug.  She is already on farxiga which I discussed is helping her and that we will check annually but she would like to start losartan.  I believe her blood pressure would  handle this -Losartan 25 mg started today -2-week follow-up consider BMP at this time + BP recheck   Vaginal Itching 37 y.o. female with vaginal itching. Wet prep performed today shows KOH and yeast consistent with yeast infection.  Patient is interested in STI screening.   Plan: -Wet prep as above.  Will treat with diflucan. -F/u STD testing -Follow-up as needed  Gerrit Heck, MD [place]

## 2022-10-16 NOTE — Patient Instructions (Addendum)
It was great to see you! Thank you for allowing me to participate in your care!   Our plans for today:  - We will let you know what your wet prep shows - Your are on a kidney protecting medicine already-we will follow that urine number and there are other options for kidney protection medications that we could start - recommend discussion with your PCP (sending in losartan to take daily to help more with renal protection - please follow up in 2 weeks)  Take care and seek immediate care sooner if you develop any concerns.  Gerrit Heck, MD

## 2022-10-16 NOTE — Assessment & Plan Note (Signed)
Discussed with patient to start taking her PPI regularly to see if this may help with her morning cough.  Also discussed following up with PCP in a couple weeks to see if this is improving.  Does appear to be moving air well and no wheezes on examination to make me think of an asthma exacerbation -Refilled PPI

## 2022-10-17 LAB — CERVICOVAGINAL ANCILLARY ONLY
Chlamydia: NEGATIVE
Comment: NEGATIVE
Comment: NORMAL
Neisseria Gonorrhea: NEGATIVE

## 2022-10-28 ENCOUNTER — Ambulatory Visit (INDEPENDENT_AMBULATORY_CARE_PROVIDER_SITE_OTHER): Payer: Medicaid Other | Admitting: Family Medicine

## 2022-10-28 ENCOUNTER — Encounter: Payer: Self-pay | Admitting: Family Medicine

## 2022-10-28 VITALS — BP 117/60 | HR 98 | Ht 64.0 in | Wt 182.0 lb

## 2022-10-28 DIAGNOSIS — R000 Tachycardia, unspecified: Secondary | ICD-10-CM | POA: Diagnosis not present

## 2022-10-28 DIAGNOSIS — N181 Chronic kidney disease, stage 1: Secondary | ICD-10-CM | POA: Diagnosis not present

## 2022-10-28 DIAGNOSIS — R5383 Other fatigue: Secondary | ICD-10-CM

## 2022-10-28 DIAGNOSIS — E119 Type 2 diabetes mellitus without complications: Secondary | ICD-10-CM | POA: Diagnosis not present

## 2022-10-28 DIAGNOSIS — E1122 Type 2 diabetes mellitus with diabetic chronic kidney disease: Secondary | ICD-10-CM | POA: Diagnosis not present

## 2022-10-28 MED ORDER — ACCU-CHEK SOFTCLIX LANCETS MISC: 5 refills | Status: AC

## 2022-10-28 MED ORDER — ACCU-CHEK GUIDE VI STRP
ORAL_STRIP | 5 refills | Status: AC
  Filled 2023-04-15: qty 50, 50d supply, fill #0
  Filled 2023-05-15 – 2023-06-02 (×3): qty 50, 50d supply, fill #1

## 2022-10-28 NOTE — Patient Instructions (Addendum)
It was nice seeing you today!  Blood work today.  Stay well, Zola Button, MD [place] [phone]  --  Make sure to check out at the front desk before you leave today.  Please arrive at least 15 minutes prior to your scheduled appointments.  If you had blood work today, I will send you a MyChart message or a letter if results are normal. Otherwise, I will give you a call.  If you had a referral placed, they will call you to set up an appointment. Please give [place] a call if you don't hear back in the next 2 weeks.  If you need additional refills before your next appointment, please call your pharmacy first.

## 2022-10-28 NOTE — Assessment & Plan Note (Signed)
Previously noted issue, seems to related to anxiety.  Has had workup for this in the past.  Reassurance provided.

## 2022-10-28 NOTE — Assessment & Plan Note (Signed)
Secondary to diabetes currently on SGLT2 inhibitor and ARB.  Will check BMP today.

## 2022-10-28 NOTE — Progress Notes (Signed)
    SUBJECTIVE:   CHIEF COMPLAINT / HPI:  Chief Complaint  Patient presents with   Discuss test results    Patient is here to discuss elevated urine microalbumin results.  She is concerned about kidney disease as there is family history of this.  She was seen in clinic 2 weeks ago by colleague Dr. Herb Grays and she was started on losartan for renal protection.  She has had some concerns about tachycardia and notes that her heart rate goes as high as the 130s.  She reports that these episodes occur when she is anxious.  Patient experiencing some fatigue and would like to have her thyroid levels checked.  Patient also will be starting [employer] school and needs her vaccination record.  PERTINENT  PMH / PSH: T2DM, depression, anxiety  Patient Care Team: Rise Patience, DO as PCP - General (Family Medicine)   OBJECTIVE:   BP 117/60   Pulse 98   Ht '5\' 4"'$  (1.626 m)   Wt 182 lb (82.6 kg)   SpO2 100%   BMI 31.24 kg/m   Physical Exam Constitutional:      General: She is not in acute distress. HENT:     Head: Normocephalic and atraumatic.  Cardiovascular:     Rate and Rhythm: Normal rate and regular rhythm.  Pulmonary:     Effort: Pulmonary effort is normal. No respiratory distress.     Breath sounds: Normal breath sounds.  Musculoskeletal:     Cervical back: Neck supple.  Neurological:     Mental Status: She is alert.         10/28/2022    9:43 AM  Depression screen PHQ 2/9  Decreased Interest 0  Down, Depressed, Hopeless 0  PHQ - 2 Score 0  Altered sleeping 0  Tired, decreased energy 0  Change in appetite 0  Feeling bad or failure about yourself  0  Trouble concentrating 0  Moving slowly or fidgety/restless 0  Suicidal thoughts 0  PHQ-9 Score 0  Difficult doing work/chores Not difficult at all     {Show previous vital signs (optional):23777}    ASSESSMENT/PLAN:   Chronic kidney disease (CKD) stage G1/A2, glomerular filtration rate (GFR) equal to or greater than  90 mL/min/1.73 square meter and albuminuria creatinine ratio between 30-299 mg/g Secondary to diabetes currently on SGLT2 inhibitor and ARB.  Will check BMP today.  Sinus tachycardia Previously noted issue, seems to related to anxiety.  Has had workup for this in the past.  Reassurance provided.   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   Zola Button, MD [place]

## 2022-10-29 LAB — BASIC METABOLIC PANEL
BUN/Creatinine Ratio: 12 (ref 9–23)
BUN: 7 mg/dL (ref 6–20)
CO2: 21 mmol/L (ref 20–29)
Calcium: 9 mg/dL (ref 8.7–10.2)
Chloride: 100 mmol/L (ref 96–106)
Creatinine, Ser: 0.59 mg/dL (ref 0.57–1.00)
Glucose: 91 mg/dL (ref 70–99)
Potassium: 4 mmol/L (ref 3.5–5.2)
Sodium: 137 mmol/L (ref 134–144)
eGFR: 120 mL/min/{1.73_m2} (ref >59)

## 2022-10-29 LAB — TSH RFX ON ABNORMAL TO FREE T4: TSH: 1.16 u[IU]/mL (ref 0.450–4.500)

## 2022-10-31 DIAGNOSIS — F411 Generalized anxiety disorder: Secondary | ICD-10-CM | POA: Diagnosis not present

## 2022-11-04 NOTE — Telephone Encounter (Signed)
Open in error

## 2022-11-08 ENCOUNTER — Ambulatory Visit: Payer: Medicaid Other | Admitting: Family Medicine

## 2022-11-08 ENCOUNTER — Encounter: Payer: Self-pay | Admitting: Family Medicine

## 2022-11-08 VITALS — BP 122/80 | HR 98 | Ht 64.0 in | Wt 180.5 lb

## 2022-11-08 DIAGNOSIS — Z2839 Other underimmunization status: Secondary | ICD-10-CM

## 2022-11-08 DIAGNOSIS — I10 Essential (primary) hypertension: Secondary | ICD-10-CM

## 2022-11-08 DIAGNOSIS — Z23 Encounter for immunization: Secondary | ICD-10-CM | POA: Diagnosis not present

## 2022-11-08 DIAGNOSIS — Z1159 Encounter for screening for other viral diseases: Secondary | ICD-10-CM | POA: Diagnosis not present

## 2022-11-08 MED ORDER — POLYETHYLENE GLYCOL 3350 17 GM/SCOOP PO POWD: 17.0000 g | Freq: Every day | ORAL | 0 refills | Status: AC

## 2022-11-08 NOTE — Patient Instructions (Signed)
We are going to do the MMR vaccine today  We are getting labs to check and see if you have immunity to hepatitis A and B and that will determine if we need any further vaccines.   Please make sure to print off your document for the vaccines so that we can fill it out, you can drop it off at the front desk.  Make sure to get the record of your flu vaccine from the [facility].

## 2022-11-08 NOTE — Progress Notes (Signed)
    SUBJECTIVE:   CHIEF COMPLAINT / HPI:   Vaccines - Patient is going to start going to college and is needing vaccine information - Does not have documents from her home country regarding her vaccine status - On her phone patient has picture of the vaccines needed to be verified which include the following: DTaP, IPV, MMR, Hib, hep B, hep A, varicella, pneumococcal, flu, rotavirus, and meningococcal, COVID.  Hypertension - Patient reports taking only half of the losartan dose due to side effects  Left ear pain - Patient reports her daughter screamed in her ear very loudly and she has been having pain in it since yesterday   PERTINENT  PMH / PSH: Reviewed  OBJECTIVE:   BP 122/80   Pulse 98   Ht 5' 4"$  (1.626 m)   Wt 180 lb 8 oz (81.9 kg)   SpO2 100%   BMI 30.98 kg/m   General: NAD, well-appearing, well-nourished Respiratory: No respiratory distress, breathing comfortably, able to speak in full sentences Skin: warm and dry, no rashes noted on exposed skin Psych: Appropriate affect and mood HEENT: TM clear bilaterally, no erythema or perforation noted  ASSESSMENT/PLAN:   Primary hypertension BP within normal range at 120/80.  Was started on losartan 25 mg previously but does not tolerate the dose due to side effects. - Continue losartan 12.5 mg - Continue to monitor blood pressures   Left ear pain Likely in reaction to loud high-pitched sound in her ear, does not appear to have any concerning signs at this time.  Given the acuity of it and lack of findings, will need to trend for any concerns for hearing damage in the future if patient is worried.  Vaccinations - Was able to verify several vaccinations previously given to patient - Patient to bring in vaccine form for school along with flu vaccine proof from [place] - Hepatitis A and B titers today - MMR given today  Rise Patience, [place]

## 2022-11-08 NOTE — Assessment & Plan Note (Signed)
BP within normal range at 120/80.  Was started on losartan 25 mg previously but does not tolerate the dose due to side effects. - Continue losartan 12.5 mg - Continue to monitor blood pressures

## 2022-11-09 LAB — HEPATITIS A ANTIBODY, TOTAL: hep A Total Ab: NEGATIVE

## 2022-11-09 LAB — HEPATITIS B SURFACE ANTIBODY, QUANTITATIVE: Hepatitis B Surf Ab Quant: 248.2 m[IU]/mL (ref >9.9)

## 2022-11-18 ENCOUNTER — Encounter: Payer: Medicaid Other | Attending: Family Medicine | Admitting: Registered"

## 2022-11-18 ENCOUNTER — Encounter: Payer: Self-pay | Admitting: Registered"

## 2022-11-18 DIAGNOSIS — Z713 Dietary counseling and surveillance: Secondary | ICD-10-CM | POA: Diagnosis not present

## 2022-11-18 DIAGNOSIS — E119 Type 2 diabetes mellitus without complications: Secondary | ICD-10-CM | POA: Insufficient documentation

## 2022-11-18 DIAGNOSIS — E1122 Type 2 diabetes mellitus with diabetic chronic kidney disease: Secondary | ICD-10-CM

## 2022-11-18 NOTE — Progress Notes (Signed)
Diabetes Self-Management Education  Visit Type: First/Initial  Appt. Start Time: 0805 Appt. End Time: [id number]  11/18/2022  [name], identified by name and date of birth, is a 37 y.o. female with a diagnosis of Diabetes: Type 2.   ASSESSMENT  There were no vitals taken for this visit. There is no height or weight on file to calculate BMI.  Wt Readings from Last 3 Encounters:  11/08/22 180 lb 8 oz (81.9 kg)  10/28/22 182 lb (82.6 kg)  10/16/22 184 lb (83.5 kg)   Lab Results  Component Value Date   HGBA1C 6.9 10/07/2022  7.7% 38yrago  Medication:Farxiga 10 mg; Trulicity .75 1/week; metformin 500 mg after breakfast. (Taking as directed)  Pt reports after 2 years of initial medications, Farxiga and metformin, her FBS remained high. Pt reports after Trulicity her FBS reduced from ~150 to ~105 mg/dL Pt states she wants to know what affects fasting blood sugar. Did not have time first visit to educate in detail.  SMBG: Pt reports only checking FBG now because PPBG have been fine, usually around 120 mg/dL. Pt reports once after eating a sweet orange her BG was 170.   Pt reports 5 months ago was experiencing low blood sugar with a value of 99 mg/dL. Pt states her mother has that issue and told her to drink or eat something sweet. RD provided basic education on hypoglycemia.   Diet: Pt does not like vegetables very much, eats about 1 c cooked per day. Pt likes fruit but limits to about 1/2 serving to day because afraid it has too much sugar. Pt eats ~[id number] of whole wheat per day, 2 cups white (Basmati?) rice per day. Pt states she enjoys white rice, her cultural food. Pt states she wants help with snack ideas and timing of eating.  Pt reports lunch heaviest meal, usually includes about 2 cups of rice. dinner time varies 6:30 - 9 pm, may eat heavier dinner a few times per month.  Physical activity: Likes to walk, but can only do when children are in school, children have special  needs.   Stress: Pt states it is hard for her not be be working, she had good job in her country, but her [employer] not allow her to work, husband is only allowed to work in his r[employer]al architectposition while in his PhD program. Pt also reports that she assumes most of the responsibility for the children due to time constraints of husband.    Diabetes Self-Management Education - 11/18/22 0800       Visit Information   Visit Type First/Initial      Initial Visit   Diabetes Type Type 2    Date Diagnosed 2 yrs ago    Are you currently following a meal plan? No    Are you taking your medications as prescribed? Yes      Health Coping   How would you rate your overall health? Good      Psychosocial Assessment   Patient Belief/Attitude about Diabetes Motivated to manage diabetes    How often do you need to have someone help you when you read instructions, pamphlets, or other written materials from your doctor or pharmacy? 1 - Never    What is the last grade level you completed in school? masters      Pre-Education Assessment   Patient understands the diabetes disease and treatment process. Needs Instruction    Patient understands incorporating nutritional management into lifestyle. Needs  Instruction    Patient undertands incorporating physical activity into lifestyle. Needs Instruction    Patient understands using medications safely. Comprehends key points    Patient understands monitoring blood glucose, interpreting and using results Needs Instruction    Patient understands prevention, detection, and treatment of acute complications. Needs Instruction    Patient understands prevention, detection, and treatment of chronic complications. Needs Instruction    Patient understands how to develop strategies to address psychosocial issues. Needs Instruction    Patient understands how to develop strategies to promote health/change behavior. Needs Instruction      Complications   Last HgB A1C  per patient/outside source 6.9 %    How often do you check your blood sugar? 1-2 times/day    Fasting Blood glucose range (mg/dL) 70-129   106-109   Postprandial Blood glucose range (mg/dL) 70-129   160-170   Have you had a dilated eye exam in the past 12 months? Yes    Have you had a dental exam in the past 12 months? Yes    Are you checking your feet? N/A      Dietary Intake   Breakfast 1 egg, tea with milk, stevia, 2 whole grain bread    Snack (morning) nuts OR apple OR vegetable roll    Lunch 2 c Basmati rice, protein, vegetables    Snack (afternoon) tea, mik stevia, with biscuit    Dinner 1 whole wheat, protein    Snack (evening) milk tea 2-3 packet Stevia    Beverage(s) water, miilk tea      Activity / Exercise   Activity / Exercise Type ADL's    How many days per week do you exercise? 4    How many minutes per day do you exercise? 20    Total minutes per week of exercise 80      Patient Education   Previous Diabetes Education No    Disease Pathophysiology Definition of diabetes, type 1 and 2, and the diagnosis of diabetes   did not cover T1 patho   Healthy Eating Role of diet in the treatment of diabetes and the relationship between the three main macronutrients and blood glucose level    Being Active Other (comment)   general goal guidelines   Medications Reviewed patients medication for diabetes, action, purpose, timing of dose and side effects.      Individualized Goals (developed by patient)   Nutrition General guidelines for healthy choices and portions discussed    Physical Activity Exercise 3-5 times per week    Medications Other (comment)   discuss yeast infection with doctor Wilder Glade)     Post-Education Assessment   Patient understands the diabetes disease and treatment process. Needs Review    Patient understands incorporating nutritional management into lifestyle. Needs Review    Patient undertands incorporating physical activity into lifestyle. Needs Review     Patient understands using medications safely. Needs Review    Patient understands monitoring blood glucose, interpreting and using results Needs Instruction    Patient understands prevention, detection, and treatment of acute complications. Needs Instruction    Patient understands prevention, detection, and treatment of chronic complications. Needs Instruction    Patient understands how to develop strategies to address psychosocial issues. Needs Instruction    Patient understands how to develop strategies to promote health/change behavior. Needs Review      Outcomes   Expected Outcomes Demonstrated interest in learning. Expect positive outcomes    Future DMSE 2 wks    Program  Status Not Completed            Individualized Plan for Diabetes Self-Management Training:   Learning Objective:  Patient will have a greater understanding of diabetes self-management. Patient education plan is to attend individual and/or group sessions per assessed needs and concerns.  Patient Instructions  Plan:  Aim to eat balanced meals and snacks. Consider strategies to lower rice amount and/or effect on blood sugar.   Include protein with your meals and snacks  Drink plenty of water to help reduce potential side effects of Farxiga. Talk to doctor about yeast infections.  Consider increasing your activity to equal 150 minutes per week as tolerate    Expected Outcomes:  Demonstrated interest in learning. Expect positive outcomes  Education material provided: My Plate, Snack sheet, and Carbohydrate counting sheet, medication list, GLP-1 Novonordisk handout  If problems or questions, patient to contact team via:  Phone and MyChart  Future DSME appointment: 3 wks

## 2022-11-18 NOTE — Patient Instructions (Signed)
Plan:  Aim to eat balanced meals and snacks. Consider strategies to lower rice amount and/or effect on blood sugar.   Include protein with your meals and snacks  Drink plenty of water to help reduce potential side effects of Farxiga. Talk to doctor about yeast infections.  Consider increasing your activity to equal 150 minutes per week as tolerate

## 2022-11-26 DIAGNOSIS — B3731 Acute candidiasis of vulva and vagina: Secondary | ICD-10-CM | POA: Diagnosis not present

## 2022-11-26 DIAGNOSIS — Z6830 Body mass index (BMI) 30.0-30.9, adult: Secondary | ICD-10-CM | POA: Diagnosis not present

## 2022-11-26 DIAGNOSIS — Z113 Encounter for screening for infections with a predominantly sexual mode of transmission: Secondary | ICD-10-CM | POA: Diagnosis not present

## 2022-11-26 DIAGNOSIS — Z3040 Encounter for surveillance of contraceptives, unspecified: Secondary | ICD-10-CM | POA: Diagnosis not present

## 2022-11-26 DIAGNOSIS — E119 Type 2 diabetes mellitus without complications: Secondary | ICD-10-CM | POA: Diagnosis not present

## 2022-11-26 DIAGNOSIS — Z0001 Encounter for general adult medical examination with abnormal findings: Secondary | ICD-10-CM | POA: Diagnosis not present

## 2022-11-29 ENCOUNTER — Other Ambulatory Visit: Payer: Self-pay | Admitting: Family Medicine

## 2022-12-03 ENCOUNTER — Other Ambulatory Visit: Payer: Self-pay | Admitting: Family Medicine

## 2022-12-03 NOTE — Addendum Note (Signed)
Addended by: Talbot Grumbling on: 12/03/2022 12:26 PM   Modules accepted: Orders

## 2022-12-03 NOTE — Telephone Encounter (Signed)
Patient calls nurse line requesting that prescription be updated for a 3 month supply.   Pended to this encounter.   Talbot Grumbling, RN

## 2022-12-04 ENCOUNTER — Other Ambulatory Visit: Payer: Self-pay | Admitting: Student

## 2022-12-04 DIAGNOSIS — E119 Type 2 diabetes mellitus without complications: Secondary | ICD-10-CM

## 2022-12-04 MED ORDER — TRULICITY 0.75 MG/0.5ML ~~LOC~~ SOAJ: SUBCUTANEOUS | 0 refills | Status: DC

## 2022-12-06 ENCOUNTER — Encounter: Payer: Self-pay | Admitting: Student

## 2022-12-06 ENCOUNTER — Ambulatory Visit: Payer: Medicaid Other | Admitting: Student

## 2022-12-06 VITALS — BP 118/64 | HR 100 | Ht 64.0 in | Wt 174.0 lb

## 2022-12-06 DIAGNOSIS — Z23 Encounter for immunization: Secondary | ICD-10-CM

## 2022-12-06 DIAGNOSIS — N181 Chronic kidney disease, stage 1: Secondary | ICD-10-CM | POA: Diagnosis not present

## 2022-12-06 DIAGNOSIS — Z7185 Encounter for immunization safety counseling: Secondary | ICD-10-CM | POA: Diagnosis not present

## 2022-12-06 DIAGNOSIS — R399 Unspecified symptoms and signs involving the genitourinary system: Secondary | ICD-10-CM

## 2022-12-06 DIAGNOSIS — E119 Type 2 diabetes mellitus without complications: Secondary | ICD-10-CM | POA: Diagnosis not present

## 2022-12-06 DIAGNOSIS — E1122 Type 2 diabetes mellitus with diabetic chronic kidney disease: Secondary | ICD-10-CM

## 2022-12-06 LAB — POCT URINALYSIS DIP (MANUAL ENTRY)
Bilirubin, UA: NEGATIVE
Glucose, UA: >=1000 mg/dL — AB
Ketones, POC UA: NEGATIVE mg/dL
Leukocytes, UA: NEGATIVE
Nitrite, UA: NEGATIVE
Protein Ur, POC: NEGATIVE mg/dL
Spec Grav, UA: 1.01 (ref 1.010–1.025)
Urobilinogen, UA: 0.2 E.U./dL
pH, UA: 6.5 (ref 5.0–8.0)

## 2022-12-06 LAB — POCT UA - MICROSCOPIC ONLY
Bacteria, U Microscopic: NONE SEEN
WBC, Ur, HPF, POC: NONE SEEN (ref 0–5)

## 2022-12-06 MED ORDER — TRULICITY 0.75 MG/0.5ML ~~LOC~~ SOAJ: SUBCUTANEOUS | 0 refills | Status: DC

## 2022-12-06 MED ORDER — LOSARTAN POTASSIUM 25 MG PO TABS: 12.5000 mg | ORAL_TABLET | Freq: Every day | ORAL | 0 refills | Status: DC

## 2022-12-06 NOTE — Progress Notes (Signed)
    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 Does not have records of varicella. Unsure if she had chickenpox.  She says that the OB/GYN told her that her urine had protein and blood in it.  I was able to look back at the OB/GYN note from 11/26/2022 which mentions just protein in her urine but did not mention any blood.  Not having any issues in terms of urinary symptoms-denies any frequency, dysuria, abdominal pain. Currently taking renal protective medications.  On menstrual period right now/spotting from IUD.  PERTINENT  PMH / PSH: Diabetes  OBJECTIVE:   BP 118/64   Pulse 100   Ht 5\' 4"  (1.626 m)   Wt 174 lb (78.9 kg)   SpO2 97%   BMI 29.87 kg/m   General: Well appearing, NAD, awake, alert, responsive to questions Head: Normocephalic atraumatic Respiratory: chest rises symmetrically,  no increased work of breathing   ASSESSMENT/PLAN:   Chronic kidney disease (CKD) stage G1/A2, glomerular filtration rate (GFR) equal to or greater than 90 mL/min/1.73 square meter and albuminuria creatinine ratio between 30-299 mg/g UA obtained today d/t protein and possible blood seen on UA from OBGYN. UA today shows no protein, moderate blood (patient on period) and 0-3 RBCs. Asymptomatic. Glucosuria present but patient is on SGLT2 and this is to be expected. -Future UA/micro ordered (discussed with patient to call lab when not having spotting/bleeding) -Continue renal protective medications (refilled losartan 12.5)   Vaccine counseling Needed vaccines for school. Will check varicella titer today-she is unsure if she had chickenpox.  - Varicella Zoster Abs, IgG/IgM - Hepatitis A vaccine adult IM - MMR vaccine subcutaneous   Gerrit Heck, MD [place]

## 2022-12-06 NOTE — Assessment & Plan Note (Signed)
UA obtained today d/t protein and possible blood seen on UA from OBGYN. UA today shows no protein, moderate blood (patient on period) and 0-3 RBCs. Asymptomatic. Glucosuria present but patient is on SGLT2 and this is to be expected. -Future UA/micro ordered (discussed with patient to call lab when not having spotting/bleeding) -Continue renal protective medications (refilled losartan 12.5)

## 2022-12-06 NOTE — Patient Instructions (Addendum)
It was great to see you! Thank you for allowing me to participate in your care!   Our plans for today:  - I will call you with the results of the urine sample - We will give you the hepatitis A vaccine today and check a varicella vaccine - I have sent refills of your medications  Take care and seek immediate care sooner if you develop any concerns.  Gerrit Heck, MD

## 2022-12-09 ENCOUNTER — Encounter: Payer: Medicaid Other | Admitting: Registered"

## 2022-12-09 ENCOUNTER — Encounter: Payer: Self-pay | Admitting: Registered"

## 2022-12-09 ENCOUNTER — Encounter: Payer: Self-pay | Admitting: Student

## 2022-12-09 ENCOUNTER — Ambulatory Visit: Payer: Medicaid Other

## 2022-12-09 ENCOUNTER — Ambulatory Visit: Payer: Medicaid Other | Admitting: Student

## 2022-12-09 VITALS — BP 115/78 | HR 70 | Wt 174.4 lb

## 2022-12-09 DIAGNOSIS — R350 Frequency of micturition: Secondary | ICD-10-CM

## 2022-12-09 DIAGNOSIS — E1122 Type 2 diabetes mellitus with diabetic chronic kidney disease: Secondary | ICD-10-CM

## 2022-12-09 DIAGNOSIS — R300 Dysuria: Secondary | ICD-10-CM | POA: Diagnosis not present

## 2022-12-09 DIAGNOSIS — N898 Other specified noninflammatory disorders of vagina: Secondary | ICD-10-CM | POA: Diagnosis not present

## 2022-12-09 DIAGNOSIS — R310 Gross hematuria: Secondary | ICD-10-CM | POA: Diagnosis not present

## 2022-12-09 LAB — POCT URINALYSIS DIP (MANUAL ENTRY)
Bilirubin, UA: NEGATIVE
Glucose, UA: >=1000 mg/dL — AB
Ketones, POC UA: NEGATIVE mg/dL
Leukocytes, UA: NEGATIVE
Nitrite, UA: NEGATIVE
Protein Ur, POC: NEGATIVE mg/dL
Spec Grav, UA: 1.02 (ref 1.010–1.025)
Urobilinogen, UA: 0.2 E.U./dL
pH, UA: 7 (ref 5.0–8.0)

## 2022-12-09 LAB — VARICELLA ZOSTER ABS, IGG/IGM
Varicella IgM: <0.91 index (ref 0.00–0.90)
Varicella zoster IgG: 1456 index (ref >165)

## 2022-12-09 LAB — POCT WET PREP (WET MOUNT)
Clue Cells Wet Prep Whiff POC: NEGATIVE
Trichomonas Wet Prep HPF POC: ABSENT

## 2022-12-09 LAB — POCT UA - MICROSCOPIC ONLY
RBC, Urine, Miroscopic: >20 (ref 0–2)
WBC, Ur, HPF, POC: NONE SEEN (ref 0–5)

## 2022-12-09 NOTE — Progress Notes (Unsigned)
    SUBJECTIVE:   CHIEF COMPLAINT / HPI:   Dysuria Pt admits to dysuria for past 2-3 days. Also admits to increased frequency. Has some supra pubic pain in the mornings but not right now. No fever. Hasn't taken and medications for these symptoms. She is currently spotting, has IUD. Admits to increased vaginal discharge which is white and stringy and non ordorus.   PERTINENT  PMH / PSH: ***  OBJECTIVE:   BP 115/78   Pulse 70   Wt 174 lb 6.4 oz (79.1 kg)   SpO2 100%   BMI 29.94 kg/m  ***  General: NAD, pleasant, able to participate in exam Cardiac: RRR, no murmurs. Respiratory: CTAB, normal effort, No wheezes, rales or rhonchi Abdomen: Bowel sounds present, nontender, nondistended, no hepatosplenomegaly. Extremities: no edema or cyanosis. Skin: warm and dry, no rashes noted Neuro: alert, no obvious focal deficits Psych: Normal affect and mood  ASSESSMENT/PLAN:   No problem-specific Assessment & Plan notes found for this encounter.     Dr. Precious Gilding, [place]    {    This will disappear when note is signed, click to select method of visit    :1}

## 2022-12-09 NOTE — Progress Notes (Signed)
Virtual Visit via Video Note  I connected with Cindy Howell on 12/09/22 at 10:15 AM EDT by a video enabled telemedicine application and verified that I am speaking with the correct person using two identifiers.  Location: Patient: home Provider: Nutrition and Diabetes Education   I discussed the limitations of evaluation and management by telemedicine and the availability of in person appointments. The patient expressed understanding and agreed to proceed.  Assessment and Plan: Pt states wants meal plan  Diabetes Self-Management Education  Visit Type: Follow-up  Appt. Start Time: 1015 Appt. End Time: 1051  12/09/2022  [name], identified by name and date of birth, is a 37 y.o. female with a diagnosis of Diabetes: Type 2.   ASSESSMENT  There were no vitals taken for this visit. There is no height or weight on file to calculate BMI.  Wt Readings from Last 3 Encounters:  12/06/22 174 lb (78.9 kg)  11/08/22 180 lb 8 oz (81.9 kg)  10/28/22 182 lb (82.6 kg)   Lab Results  Component Value Date   HGBA1C 6.9 10/07/2022  7.7% 6yr ago  Medication:Farxiga 10 mg; Trulicity .75 1/week; metformin 500 mg after breakfast. (Taking as directed) Pt asked if she should take more metformin for blood sugar spikes. Reviewed metformin action and to not self adjust dose.  SMBG: FBG 100-109 mg/dL. PPBG 126 mg/dL.   Pt states MD told her she has microalbuminuria was concerned that she shouldn't eat protein.  Diet: Pt reports she goes out to eat 2x/month concerned about blood sugar spikes.  Vegetables: reports she likes the following vegetables - beans, spinach, cauliflower, cucumber, celery, tomato  Fruit: apple OR banana / include protein: eggs, nut, nut butter Whole grains:  Continue using whole wheat. Occasionally having small serving of naan is okay. Continue eating brown rice. White rice a couple of times per week is fine. Protein: lean protein is recommended. Beans are a great  way to include protein and are great for kidney health. Limiting red meat to once a week is recommended. 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  Stress:  not assessed today.   Diabetes Self-Management Education - 12/09/22 1301       Visit Information   Visit Type Follow-up      Initial Visit   Diabetes Type Type 2      Pre-Education Assessment   Patient understands the diabetes disease and treatment process. Needs Review    Patient understands incorporating nutritional management into lifestyle. Needs Review    Patient undertands incorporating physical activity into lifestyle. Comprehends key points    Patient understands using medications safely. Comprehends key points    Patient understands monitoring blood glucose, interpreting and using results Comprehends key points    Patient understands prevention, detection, and treatment of acute complications. Needs Instruction    Patient understands prevention, detection, and treatment of chronic complications. Needs Instruction    Patient understands how to develop strategies to address psychosocial issues. Needs Instruction    Patient understands how to develop strategies to promote health/change behavior. Needs Review      Complications   Fasting Blood glucose range (mg/dL) 70-129    Postprandial Blood glucose range (mg/dL) 130-179      Activity / Exercise   How many days per week do you exercise? 5    How many minutes  per day do you exercise? 20    Total minutes per week of exercise 100      Patient Education   Being Active Role of exercise on diabetes management, blood pressure control and cardiac health.    Medications Reviewed patients medication for diabetes, action, purpose, timing of dose and side effects.      Individualized Goals (developed by patient)   Nutrition General guidelines for  healthy choices and portions discussed    Physical Activity Exercise 5-7 days per week    Medications Other (comment)   don't change medication dose on your own     Patient Self-Evaluation of Goals - Patient rates self as meeting previously set goals (% of time)   Physical Activity >75% (most of the time)      Post-Education Assessment   Patient understands the diabetes disease and treatment process. Needs Review    Patient understands incorporating nutritional management into lifestyle. Needs Review    Patient undertands incorporating physical activity into lifestyle. Needs Review    Patient understands using medications safely. Needs Review    Patient understands monitoring blood glucose, interpreting and using results Needs Review    Patient understands prevention, detection, and treatment of acute complications. Needs Review    Patient understands prevention, detection, and treatment of chronic complications. Needs Review    Patient understands how to develop strategies to address psychosocial issues. Needs Instruction    Patient understands how to develop strategies to promote health/change behavior. Needs Review      Outcomes   Expected Outcomes Demonstrated interest in learning. Expect positive outcomes    Future DMSE 4-6 wks    Program Status Not Completed      Subsequent Visit   Since your last visit have you continued or begun to take your medications as prescribed? Yes               Individualized Plan for Diabetes Self-Management Training:   Learning Objective:  Patient will have a greater understanding of diabetes self-management. Patient education plan is to attend individual and/or group sessions per assessed needs and concerns.  Patient Instructions  Creating a meal plan starts with MyPlate guidance, 1/2 plate vegetables, 1/4 starch or starchy vegetables, 1/4 protein (for meat that is about the size of a deck of cards).  Below are details and ideas to make  your meals diabetes friendly  Vegetables: 3 servings per day. 1 serving is 1 cup of raw or 1/2 c cooked. Start by making a list of the vegetables you enjoy - some that you mentioned today are beans, spinach, cauliflower, cucumber, celery, tomato  Fruit: 2 servings per day. Those you mention you enjoy are apple OR banana. When eating fruit include protein: eggs, nut, nut butter, Greek yogurt. Whole grains:  Continue using whole wheat bread. Occasionally having small serving of naan is okay. Continue eating mostly brown rice. White rice a couple of times per week is fine. Protein: lean protein is recommended. Beans are a great way to include protein and are great for kidney health. Limiting red meat to once a week is recommended. Fish is recommended 2-3 times per week.   For a specific breakfast you can try: Scrambled eggs, steamed spinach and mushrooms, 1 slice whole toast. Tea with milk, stevia  Also physical activity plays a big role in your body's ability to control blood sugar Your physical activity goal is appropriate: 15-20 min 1-2x/daily When you are not able to get outside, you can do  activities in your home. Search for ideas on the internet. Listen to your body if any movement hurts stop. Here is one site you can check out https://anderson-colon.net/  More information about your kidney health. Please look at this website and talk to your doctor if you want to understand your lab that you mentioned today (microalbuminuria) [email or web address]  Expected Outcomes:  Demonstrated interest in learning. Expect positive outcomes  Education material provided:   If problems or questions, patient to contact team via:  Phone and MyChart  Future DSME appointment: 3 wks  Follow Up Instructions:  I discussed the assessment and treatment plan with the patient. The patient was provided an  opportunity to ask questions and all were answered. The patient agreed with the plan and demonstrated an understanding of the instructions.   The patient was advised to call back or seek an in-person evaluation if the symptoms worsen or if the condition fails to improve as anticipated.  I provided 30 minutes of non-face-to-face time during this encounter.  Christella Hartigan, RD

## 2022-12-09 NOTE — Patient Instructions (Signed)
It was great to see you! Thank you for allowing me to participate in your care!  Our plans for today:   - I will call you with you lab results to check for a vaginal infection or urinary tract infection.    Take care and seek immediate care sooner if you develop any concerns.   Dr. Precious Gilding, DO [facility]

## 2022-12-09 NOTE — Patient Instructions (Addendum)
Creating a meal plan starts with MyPlate guidance, 1/2 plate vegetables, 1/4 starch or starchy vegetables, 1/4 protein (for meat that is about the size of a deck of cards).  Below are details and ideas to make your meals diabetes friendly  Vegetables: 3 servings per day. 1 serving is 1 cup of raw or 1/2 c cooked. Start by making a list of the vegetables you enjoy - some that you mentioned today are beans, spinach, cauliflower, cucumber, celery, tomato  Fruit: 2 servings per day. Those you mention you enjoy are apple OR banana. When eating fruit include protein: eggs, nut, nut butter, Greek yogurt. Whole grains:  Continue using whole wheat bread. Occasionally having small serving of naan is okay. Continue eating mostly brown rice. White rice a couple of times per week is fine. Protein: lean protein is recommended. Beans are a great way to include protein and are great for kidney health. Limiting red meat to once a week is recommended. Fish is recommended 2-3 times per week.   For a specific breakfast you can try: Scrambled eggs, steamed spinach and mushrooms, 1 slice whole toast. Tea with milk, stevia  Also physical activity plays a big role in your body's ability to control blood sugar Your physical activity goal is appropriate: 15-20 min 1-2x/daily When you are not able to get outside, you can do activities in your home. Search for ideas on the internet. Listen to your body if any movement hurts stop. Here is one site you can check out https://anderson-colon.net/  More information about your kidney health. Please look at this website and talk to your doctor if you want to understand your lab that you mentioned today (microalbuminuria) [email or web address]

## 2022-12-10 ENCOUNTER — Encounter: Payer: Self-pay | Admitting: Student

## 2022-12-10 DIAGNOSIS — R300 Dysuria: Secondary | ICD-10-CM | POA: Insufficient documentation

## 2022-12-10 DIAGNOSIS — N898 Other specified noninflammatory disorders of vagina: Secondary | ICD-10-CM | POA: Insufficient documentation

## 2022-12-10 DIAGNOSIS — R310 Gross hematuria: Secondary | ICD-10-CM | POA: Insufficient documentation

## 2022-12-10 NOTE — Assessment & Plan Note (Signed)
UA negative for UTI.  Antibiotics not indicated at this time.  Patient advised to monitor symptoms, if they worsen she is advised to return.  Also advised not to use soaps in her private area as this could irritate her urethra.

## 2022-12-10 NOTE — Assessment & Plan Note (Addendum)
Wet prep negative.  Advised patient it sounds like her vaginal discharge is normal.  Did not do full pelvic exam, looking at cervix, when obtaining swabs as this was just done with OB/GYN 2 weeks ago and not indicated at this time.

## 2022-12-10 NOTE — Assessment & Plan Note (Signed)
There is blood in patient's urine sample.  This is not surprising as she is spotting.  She is concerned about this and would like to have her urine checked for blood when she is not spotting to ensure there is no blood in her urine.  Patient was given specimen cup and will return her urine at a later date when she is not spotting.

## 2022-12-11 ENCOUNTER — Other Ambulatory Visit: Payer: Self-pay

## 2022-12-11 DIAGNOSIS — J301 Allergic rhinitis due to pollen: Secondary | ICD-10-CM

## 2022-12-12 MED ORDER — FLUTICASONE PROPIONATE 50 MCG/ACT NA SUSP: 2.0000 | Freq: Every day | NASAL | 6 refills | Status: DC

## 2022-12-18 DIAGNOSIS — F411 Generalized anxiety disorder: Secondary | ICD-10-CM | POA: Diagnosis not present

## 2022-12-23 ENCOUNTER — Other Ambulatory Visit (HOSPITAL_COMMUNITY): Payer: Self-pay

## 2022-12-26 ENCOUNTER — Other Ambulatory Visit (INDEPENDENT_AMBULATORY_CARE_PROVIDER_SITE_OTHER): Payer: Medicaid Other

## 2022-12-26 ENCOUNTER — Telehealth: Payer: Self-pay

## 2022-12-26 DIAGNOSIS — R399 Unspecified symptoms and signs involving the genitourinary system: Secondary | ICD-10-CM

## 2022-12-26 LAB — POCT URINALYSIS DIP (MANUAL ENTRY)
Bilirubin, UA: NEGATIVE
Glucose, UA: >=1000 mg/dL — AB
Ketones, POC UA: NEGATIVE mg/dL
Nitrite, UA: NEGATIVE
Protein Ur, POC: NEGATIVE mg/dL
Spec Grav, UA: 1.015 (ref 1.010–1.025)
Urobilinogen, UA: 0.2 E.U./dL
pH, UA: 6 (ref 5.0–8.0)

## 2022-12-26 LAB — POCT UA - MICROSCOPIC ONLY: Epithelial cells, urine per micros: >20

## 2022-12-26 NOTE — Telephone Encounter (Signed)
Patient calls nurse line requesting to speak with provider regarding results from urine test.   Please return call to patient at [phone].  Veronda Prude, RN

## 2022-12-26 NOTE — Addendum Note (Signed)
Addended by: Georges Lynch T on: 12/26/2022 09:50 AM   Modules accepted: Orders

## 2022-12-27 ENCOUNTER — Telehealth: Payer: Self-pay | Admitting: Student

## 2022-12-27 ENCOUNTER — Ambulatory Visit: Payer: Medicaid Other

## 2022-12-27 ENCOUNTER — Ambulatory Visit: Payer: Medicaid Other | Admitting: Family Medicine

## 2022-12-27 NOTE — Telephone Encounter (Signed)
Called discussed recent UA results with patient.  She brought her urine in because she was concerned about potential blood in her urine from last visit.  However, patient had been spotting at that time which is likely why there was blood in urine.  UA showed trace blood on dipstick and 0-2 RBCs on microscopy which is nonconcerning.  Dipstick also showed trace leukocytes, no nitrites. Microscopy showed greater than 20 epithelial cells and only 1-5 WBCs with trace bacteria.  Not concerning for UTI.  Urine culture not sent.  Patient did state she had some burning around her vagina after urinating once last night, similar to burning she has on occasion after having sex with her husband.  States she has not had burning with urination this morning and no increased frequency of urination, denies any fever, chills, abdominal pain, nausea.  Feels well.  She did ask for antibiotics just in case she has a UTI.  Advised that this would not be good care since she does not currently have symptoms of UTI.  She expressed understanding and agrees to return or call us if symptoms of UTI returns which were discussed in detail.  She requested that her appointment for 9:50 this morning be canceled as she does not feel she needs to be seen.

## 2022-12-31 DIAGNOSIS — F4320 Adjustment disorder, unspecified: Secondary | ICD-10-CM | POA: Diagnosis not present

## 2023-01-06 ENCOUNTER — Other Ambulatory Visit: Payer: Self-pay | Admitting: Student

## 2023-01-06 DIAGNOSIS — E119 Type 2 diabetes mellitus without complications: Secondary | ICD-10-CM

## 2023-01-07 ENCOUNTER — Encounter: Payer: Self-pay | Admitting: Student

## 2023-01-07 ENCOUNTER — Ambulatory Visit: Payer: Medicaid Other | Admitting: Student

## 2023-01-07 VITALS — BP 113/69 | HR 100 | Ht 64.0 in | Wt 171.8 lb

## 2023-01-07 DIAGNOSIS — K219 Gastro-esophageal reflux disease without esophagitis: Secondary | ICD-10-CM

## 2023-01-07 DIAGNOSIS — E1122 Type 2 diabetes mellitus with diabetic chronic kidney disease: Secondary | ICD-10-CM

## 2023-01-07 DIAGNOSIS — J301 Allergic rhinitis due to pollen: Secondary | ICD-10-CM

## 2023-01-07 DIAGNOSIS — J4541 Moderate persistent asthma with (acute) exacerbation: Secondary | ICD-10-CM | POA: Diagnosis not present

## 2023-01-07 DIAGNOSIS — N181 Chronic kidney disease, stage 1: Secondary | ICD-10-CM

## 2023-01-07 LAB — POCT GLYCOSYLATED HEMOGLOBIN (HGB A1C): HbA1c, POC (controlled diabetic range): 5.6 % (ref 0.0–7.0)

## 2023-01-07 MED ORDER — ALBUTEROL SULFATE HFA 108 (90 BASE) MCG/ACT IN AERS
1.0000 | INHALATION_SPRAY | Freq: Four times a day (QID) | RESPIRATORY_TRACT | 0 refills | Status: DC | PRN
Start: 2023-01-07 — End: 2023-01-27

## 2023-01-07 MED ORDER — FLUTICASONE PROPIONATE 50 MCG/ACT NA SUSP: 2.0000 | Freq: Every day | NASAL | 6 refills | Status: DC

## 2023-01-07 MED ORDER — OMEPRAZOLE 20 MG PO CPDR
20.0000 mg | DELAYED_RELEASE_CAPSULE | Freq: Every day | ORAL | 3 refills | Status: DC
Start: 2023-01-07 — End: 2023-04-04

## 2023-01-07 MED ORDER — ATORVASTATIN CALCIUM 10 MG PO TABS: 10.0000 mg | ORAL_TABLET | Freq: Every day | ORAL | 0 refills | Status: DC

## 2023-01-07 MED ORDER — LISINOPRIL 5 MG PO TABS
5.0000 mg | ORAL_TABLET | Freq: Every day | ORAL | 0 refills | Status: DC
Start: 2023-01-07 — End: 2023-02-28

## 2023-01-07 MED ORDER — BUDESONIDE-FORMOTEROL FUMARATE 80-4.5 MCG/ACT IN AERO: 2.0000 | INHALATION_SPRAY | Freq: Two times a day (BID) | RESPIRATORY_TRACT | 3 refills | Status: DC

## 2023-01-07 MED ORDER — LEVOCETIRIZINE DIHYDROCHLORIDE 5 MG PO TABS: 5.0000 mg | ORAL_TABLET | Freq: Every evening | ORAL | 3 refills | Status: DC

## 2023-01-07 NOTE — Assessment & Plan Note (Addendum)
A1c very well-controlled today at 5.6.  She discussed that she has some constipation with the Trulicity-recommended over-the-counter Metamucil for this. -Continue farxiga -Continue Trulicity  -Continue metformin -Switch losartan 12.5 mg to lisinopril 5 mg daily for renal protection -Refill atorvastatin

## 2023-01-07 NOTE — Progress Notes (Signed)
SUBJECTIVE:   CHIEF COMPLAINT / HPI: Diabetes F/u  Shortness of breath Last 2 days when waking up from sleep feels like hard to take deep breaths. Doesn't feel good. No chest pain. No swelling in legs. Some tingling. No cough or fever currently. No snoring.  No orthopnea.  She says that her family has cardiac history and she is worried and wonders whether she needs an EKG today.  She has also been out of her inhalers for asthma and has not been using it.  Diabetic Follow Up: Patient is a 37 y.o. female who present today for diabetic follow up.   Patient endorses no problems  Home medications include: farxiga 10 mg daily, trulicity weekly, metformin daily ACEi/ARB: yes Statin: yes Patient endorses taking these medications as prescribed.  Most recent A1Cs:  Lab Results  Component Value Date   HGBA1C 5.6 01/07/2023   HGBA1C 6.9 10/07/2022   HGBA1C 7.1 (A) 06/21/2022   Last Microalbumin, LDL, Creatinine: Lab Results  Component Value Date   LDLCALC 63 10/07/2022   CREATININE 0.59 10/28/2022   Lightheadedness She says that she has been doing half of the losartan pill due to dizziness/orthostatic symptoms when standing up too quickly.  She said she stopped it for a week but she would like to be on a renal protective medicine at a lower dose instead of the losartan.  PERTINENT  PMH / PSH: GERD  OBJECTIVE:   BP 113/69   Pulse 100   Ht  (1.626 m)   Wt 171 lb 12.8 oz (77.9 kg)   SpO2 100%   BMI 29.49 kg/m   General: Well appearing, NAD, awake, alert, responsive to questions Head: Normocephalic atraumatic CV: Regular rate and rhythm no murmurs rubs or gallops Respiratory: Clear to ausculation bilaterally, no wheezes rales or crackles, chest rises symmetrically,  no increased work of breathing Extremities: Moves upper and lower extremities freely, no edema in LE  ASSESSMENT/PLAN:   Type 2 diabetes mellitus with diabetic chronic kidney disease (HCC) A1c very  well-controlled today at 5.6.  She discussed that she has some constipation with the Trulicity-recommended over-the-counter Metamucil for this. -Continue farxiga -Continue Trulicity  -Continue metformin -Switch losartan 12.5 mg to lisinopril 5 mg daily for renal protection -Refill atorvastatin  Shortness of breath Very well-appearing on examination today.  I discussed with her that it is unlikely to be of cardiac etiology given patient's young age no chest pain or cardiac symptoms on exam or history.  Believe likely related to seasonal allergies.  I discussed continuing Flonase and Xyzal.  I also sat that we can refill her asthma medications and to try taking her albuterol inhaler if she starts feeling short of breath.  If this is occurring more often I discussed with her to please come in to be evaluated.  Today her lung exam was completely clear with no wheezing or increased work of breathing. - fluticasone (FLONASE) 50 MCG/ACT nasal spray; Place 2 sprays into both nostrils daily.  Dispense: 16 g; Refill: 6 - levocetirizine (XYZAL) 5 MG tablet; Take 1 tablet (5 mg total) by mouth every evening.  Dispense: 30 tablet; Refill: 3 - budesonide-formoterol (SYMBICORT) 80-4.5 MCG/ACT inhaler; Inhale 2 puffs into the lungs 2 (two) times daily.  Dispense: 1 each; Refill: 3 - albuterol (VENTOLIN HFA) 108 (90 Base) MCG/ACT inhaler; Inhale 1-2 puffs into the lungs every 6 (six) hours as needed for wheezing or shortness of breath.  Dispense: 1 each; Refill: 0   Lightheadedness  She says that she gets lightheaded with the losartan 12.5 mg daily -Switch losartan to lisinopril 5 mg daily  Levin Erp, MD [facility]

## 2023-01-07 NOTE — Patient Instructions (Addendum)
It was great to see you! Thank you for allowing me to participate in your care!   Our plans for today:  - We got your A1c today-I will let you know what this shows -I am refilling your medications-please let us know if the shortness of breath getting worse and not resolving with the inhalers. I am reassured that this is not a cardiac issue  Take care and seek immediate care sooner if you develop any concerns.  Levin Erp, MD

## 2023-01-13 ENCOUNTER — Ambulatory Visit: Payer: Medicaid Other | Admitting: Registered"

## 2023-01-13 ENCOUNTER — Encounter: Payer: Self-pay | Admitting: Registered"

## 2023-01-13 DIAGNOSIS — E1122 Type 2 diabetes mellitus with diabetic chronic kidney disease: Secondary | ICD-10-CM

## 2023-01-13 DIAGNOSIS — F411 Generalized anxiety disorder: Secondary | ICD-10-CM | POA: Diagnosis not present

## 2023-01-13 NOTE — Progress Notes (Signed)
Pt was scheduled for MyChart follow-up video visit at 2:00. A link to visit was sent to Pt cell phone. Video call was discontinued at 2:13 pm, and visit marked as no-show.

## 2023-01-13 NOTE — Progress Notes (Unsigned)
Virtual Visit via Video Note  I connected with Cindy Howell on 01/13/23 at  2:00 PM EDT by a video enabled telemedicine application and verified that I am speaking with the correct person using two identifiers.  Location: Patient: home Provider: [facility]  [place]   I discussed the limitations of evaluation and management by telemedicine and the availability of in person appointments. The patient expressed understanding and agreed to proceed.  Diabetes Self-Management Education  Visit Type: Follow-up  Appt. Start Time: 1433 Appt. End Time: 1441 (8 min)  Pt arrived 30 min late for appt. Met with patient for 8 min to reschedule appt and ask if she has any urgent questions or updates. Pt states she is still interested in making a meal plan. Rescheduled visit for May 1 at 2 pm.  [name], identified by name and date of birth, is a 37 y.o. female with a diagnosis of Diabetes: Type 2.   ASSESSMENT  There were no vitals taken for this visit. There is no height or weight on file to calculate BMI.  Wt Readings from Last 4 Encounters:  01/07/23 171 lb 12.8 oz (77.9 kg)  12/09/22 174 lb 6.4 oz (79.1 kg)  12/06/22 174 lb (78.9 kg)  10/28/22 182 lb (82.6 kg)   Lab Results  Component Value Date   HGBA1C 6.9 10/07/2022   HGBA1C 5.6 01/07/2023   Medication:Farxiga 10 mg; Trulicity .75 1/week; metformin 500 mg after breakfast.  SMBG: FBG less than 110-120 mg/dL. PPBG --- mg/dL.  (80-120)  Pt states she switched from white rice to brown rice. Pt reports constipation issue. Pt states beans creates higher blood sugar: 160 mg/dL. (Falafal).  Individualized Plan for Diabetes Self-Management Training:  none  Follow Up Instructions:  I discussed the assessment and treatment plan with the patient. The patient was provided an opportunity to ask questions and all were answered. The patient agreed with the plan and demonstrated an understanding of the instructions.   The patient was  advised to call back or seek an in-person evaluation if the symptoms worsen or if the condition fails to improve as anticipated.  I provided 8 minutes of non-face-to-face time during this encounter.  Carolan Shiver, RD

## 2023-01-15 ENCOUNTER — Encounter: Payer: Medicaid Other | Attending: Family Medicine | Admitting: Registered"

## 2023-01-15 DIAGNOSIS — E1122 Type 2 diabetes mellitus with diabetic chronic kidney disease: Secondary | ICD-10-CM | POA: Diagnosis not present

## 2023-01-15 DIAGNOSIS — N181 Chronic kidney disease, stage 1: Secondary | ICD-10-CM | POA: Insufficient documentation

## 2023-01-15 NOTE — Progress Notes (Signed)
Virtual Visit via Video Note  I connected with Cindy Howell on 01/15/23 at  2:00 PM EDT by a video enabled telemedicine application and verified that I am speaking with the correct person using two identifiers.  Location: Patient: home Provider: [place], [place]   I discussed the limitations of evaluation and management by telemedicine and the availability of in person appointments. The patient expressed understanding and agreed to proceed.  Diabetes Self-Management Education  Visit Type: Follow-up  Appt. Start Time: 1403 Appt. End Time: 1433  ASSESSMENT There were no vitals taken for this visit. (Virtual appt) There is no height or weight on file to calculate BMI.  Lab Results  Component Value Date   HGBA1C 6.9 10/07/2022   HGBA1C 5.6 01/07/2023   Medication: Marcelline Deist; Trulicity; metformin 500 mg after breakfast.  SMBG: FBG not assessed this visit. PPBG not assessed this visit   Pt main concern today was creating a meal plan and snack ideas as well as what foods are good for constipation.  Pt states she gets cravings for some foods and wants to know how much she can eat. Many of the foods she mentions are culturally specific to [place]. Pt states she was having snacks like biscuits and cookies, now eating more fruit and nuts.  Pt states she switched from white rice to brown rice but wanted to verify that what she has is really brown rice. Pt reports constipation issue and would like know what to eat to help with that. Pt states beans creates higher blood sugar: 160 mg/dL. (Falafal).   Pt reports she would like to have monthly follow-up visits to help her continue to understand how to adjust her diet and how much of certain foods she can eat.  Follow Up Instructions: Use the handouts sent to you via email to work on creating a meal plan. Call the office to set up follow-up appointments.  I discussed the assessment and treatment plan with the patient. The patient was  provided an opportunity to ask questions and all were answered. The patient agreed with the plan and demonstrated an understanding of the instructions.   The patient was advised to call back or seek an in-person evaluation if the symptoms worsen or if the condition fails to improve as anticipated.  I provided 30 minutes of non-face-to-face time during this encounter.  Heywood Bene, RD, LDN, CDCES

## 2023-01-17 ENCOUNTER — Other Ambulatory Visit: Payer: Self-pay | Admitting: Family Medicine

## 2023-01-22 DIAGNOSIS — F4320 Adjustment disorder, unspecified: Secondary | ICD-10-CM | POA: Diagnosis not present

## 2023-01-24 ENCOUNTER — Other Ambulatory Visit: Payer: Self-pay

## 2023-01-24 NOTE — Telephone Encounter (Signed)
Patient calls nurse line regarding prescriptions for atorvastatin and levocetirizine. She is requesting 90 day supply for both medications. Patient is also asking about terconazole prescription. Advised that she would need an appointment for this refill. Patient reports that she will call the front office to schedule this appointment.   Called pharmacy. Patient filled 30 day supply of atorvastatin on 4/28. She has a 90 day supply on hold.   She picked up 30 day supply of levocetirizine on 12/31/22. Pharmacist reports that they have 120 tablets on file for patient. Patient will just need to request 90 day supply when she is ready for refill.   Attempted to call patient back to provide update. She did not answer. Will attempt to call back later this afternoon.   Veronda Prude, RN

## 2023-01-24 NOTE — Telephone Encounter (Signed)
Called patient and provided with update. Patient appreciative. No further questions at this time.   Veronda Prude, RN

## 2023-01-26 ENCOUNTER — Other Ambulatory Visit: Payer: Self-pay | Admitting: Student

## 2023-01-26 DIAGNOSIS — J4541 Moderate persistent asthma with (acute) exacerbation: Secondary | ICD-10-CM

## 2023-01-27 DIAGNOSIS — F411 Generalized anxiety disorder: Secondary | ICD-10-CM | POA: Diagnosis not present

## 2023-01-28 ENCOUNTER — Other Ambulatory Visit: Payer: Self-pay | Admitting: Family Medicine

## 2023-01-28 ENCOUNTER — Telehealth: Payer: Self-pay

## 2023-01-28 DIAGNOSIS — J301 Allergic rhinitis due to pollen: Secondary | ICD-10-CM

## 2023-01-28 MED ORDER — LEVOCETIRIZINE DIHYDROCHLORIDE 5 MG PO TABS
5.0000 mg | ORAL_TABLET | Freq: Every evening | ORAL | 3 refills | Status: DC
Start: 2023-01-28 — End: 2023-03-10

## 2023-01-28 NOTE — Telephone Encounter (Signed)
Patient calls nurse line requesting a 90 supply of Levocetirizine.  She reports transportation issues.   Will forward to PCP to send in 90 days.

## 2023-02-04 DIAGNOSIS — F411 Generalized anxiety disorder: Secondary | ICD-10-CM | POA: Diagnosis not present

## 2023-02-07 ENCOUNTER — Encounter: Payer: Self-pay | Admitting: Student

## 2023-02-07 ENCOUNTER — Ambulatory Visit: Payer: Medicaid Other | Admitting: Student

## 2023-02-07 VITALS — BP 103/89 | HR 106 | Ht 64.0 in | Wt 164.1 lb

## 2023-02-07 DIAGNOSIS — Z5971 Insufficient health insurance coverage: Secondary | ICD-10-CM

## 2023-02-07 DIAGNOSIS — S46911A Strain of unspecified muscle, fascia and tendon at shoulder and upper arm level, right arm, initial encounter: Secondary | ICD-10-CM

## 2023-02-07 DIAGNOSIS — R002 Palpitations: Secondary | ICD-10-CM | POA: Diagnosis not present

## 2023-02-07 DIAGNOSIS — Z5989 Other problems related to housing and economic circumstances: Secondary | ICD-10-CM | POA: Diagnosis not present

## 2023-02-07 MED ORDER — HYDROCORTISONE 1 % EX OINT: TOPICAL_OINTMENT | CUTANEOUS | 0 refills | Status: DC

## 2023-02-07 MED ORDER — IBUPROFEN 600 MG PO TABS: 600.0000 mg | ORAL_TABLET | Freq: Four times a day (QID) | ORAL | 0 refills | Status: AC | PRN

## 2023-02-07 MED ORDER — BACLOFEN 10 MG PO TABS
10.0000 mg | ORAL_TABLET | Freq: Every day | ORAL | 0 refills | Status: DC
Start: 2023-02-07 — End: 2023-04-17

## 2023-02-07 NOTE — Progress Notes (Signed)
    SUBJECTIVE:   CHIEF COMPLAINT / HPI: High high rate  Patient states that at home sometimes she has been having higher heart rates around 100s to 125.  She says that she feels palpitations during this time and also feels very overwhelmed and stressed during this.  Denies any syncope or chest pain.  She also mentions that her daughter recently pulled her right arm and she has been having some upper back pain that she would like to have some muscle relaxers for.  Mentions that her Medicaid is ending this month and this stresses her out a lot.  She does not know how she is going to afford her Trulicity medication without insurance.  She is amenable to talking to social work about this.  PERTINENT  PMH / PSH: diabetes  OBJECTIVE:   BP 103/89   Pulse (!) 106   Ht 5\' 4"  (1.626 m)   Wt 164 lb 2 oz (74.4 kg)   SpO2 100%   BMI 28.17 kg/m   General: Well appearing, NAD, awake, alert, responsive to questions Head: Normocephalic atraumatic CV: Regular rate and rhythm no murmurs rubs or gallops Respiratory: Clear to ausculation bilaterally, no wheezes rales or crackles, chest rises symmetrically,  no increased work of breathing  Shoulder: Inspection reveals no obvious deformity, atrophy, or asymmetry b/l. No bruising. No swelling Palpation is normal with no TTP except over right trap Full ROM in flexion, abduction NV intact distally b/l ASSESSMENT/PLAN:   Palpitations Has had history of sinus tachycardia in the past.  Likely secondary to anxiety.  However she feels like it is worsened recently and has periods of time where her heart rate goes up to 125.  Will order a Zio patch to ensure there is no underlying arrhythmia that could be causing this. - LONG TERM MONITOR (3-14 DAYS); Future  Shoulder strain, right, initial encounter Patient has been having some right upper back pain after daughter had pulled her arm.  No issues in terms of her shoulder examination today except for some  muscular tenderness.  Advised some NSAID use as needed and patient would like a muscle relaxer sent as well. -baclofen (LIORESAL) 10 MG tablet; Take 1 tablet (10 mg total) by mouth at bedtime.  Dispense: 20 each; Refill: 0  -Ibuprofen as needed  Insurance coverage problems She says that her Medicaid is ending at the end of this month.  Would like to discuss with social work in terms of abilities to obtain other insurance and being able to get her medications including Trulicity - AMB Referral to Managed Medicaid Care Management  Levin Erp, MD [facility]

## 2023-02-07 NOTE — Patient Instructions (Addendum)
It was great to see you! Thank you for allowing me to participate in your care!   Our plans for today:  - I have placed a zio patch order for you about these high heart rates - I have placed a social work referral for you-they will call you - I am ordering you baclofen as needed for night time use - I refilled your medications  Take care and seek immediate care sooner if you develop any concerns.  Levin Erp, MD

## 2023-02-11 ENCOUNTER — Ambulatory Visit: Payer: Medicaid Other | Attending: Family Medicine

## 2023-02-11 DIAGNOSIS — R002 Palpitations: Secondary | ICD-10-CM

## 2023-02-11 NOTE — Progress Notes (Unsigned)
Enrolled patient for a 14 day Zio XT monitor to be mailed to patients home  DOD to read 

## 2023-02-14 ENCOUNTER — Encounter (HOSPITAL_COMMUNITY): Payer: Self-pay

## 2023-02-14 ENCOUNTER — Ambulatory Visit (HOSPITAL_COMMUNITY)
Admission: EM | Admit: 2023-02-14 | Discharge: 2023-02-14 | Disposition: A | Discharge status: Home / Self Care | Payer: Medicaid Other | Attending: Internal Medicine | Admitting: Internal Medicine

## 2023-02-14 DIAGNOSIS — M5489 Other dorsalgia: Secondary | ICD-10-CM

## 2023-02-14 DIAGNOSIS — M79602 Pain in left arm: Secondary | ICD-10-CM

## 2023-02-14 DIAGNOSIS — J00 Acute nasopharyngitis [common cold]: Secondary | ICD-10-CM

## 2023-02-14 DIAGNOSIS — M25512 Pain in left shoulder: Secondary | ICD-10-CM | POA: Diagnosis not present

## 2023-02-14 DIAGNOSIS — R0789 Other chest pain: Secondary | ICD-10-CM

## 2023-02-14 NOTE — Discharge Instructions (Addendum)
Your EKG was unremarkable and did not indicate a reason for your intermittent chest pain.  As discussed if you continue to have persistent chest pain or heaviness of the chest with shortness of breath, dizziness weakness labs are all indications to go immediately to the emergency department.  As discussed your primary care doctor has ordered a heart monitor which should be arriving to you soon follow-up with your primary care doctor if this has not been received within the next 3 to 5 days.  You also have a message on your MyChart from heart care which explains to you how to apply the monitor and there is also a phone number there for you to reference if you have any problems. Continue your Flonase and levocetirizine for management of your allergy symptoms.  Continue baclofen for shoulder arm and back pain.  Follow-up with primary care doctor as needed.

## 2023-02-14 NOTE — ED Provider Notes (Signed)
As discussed your [facility]    CSN: [id number] Arrival date & time: 02/14/23  4098      History   Chief Complaint Chief Complaint  Patient presents with   Chest Pain    HPI Cindy Howell is a 37 y.o. female.   HPI Patient with a medical history significant for type 2 diabetes, CKD 1, palpitations, anxiety, asthma and tachycardia, presents today with a 2-day history of chest pain on the left side which is radiating into her left arm.  On chart review patient recently saw her primary care doctor who has ordered a Holter monitor for patient to wear for a total of 14 days to evaluate heart rhythm due to patient's complaints of palpitations.  Patient was seen as recently as 02/07/2023.  She has yet to receive her Holter monitor.  She also has unrelated concerns about ongoing allergy symptoms which consist of coughing and drainage in the throat.  The patient has been prescribed levocetirizine by her primary care doctor recently. She also notes that the left arm and shoulder along with back pain has been ongoing prior to experiencing the chest pain.  This was also addressed by her primary care doctor on 02/07/2023 and she was prescribed baclofen and advised to take ibuprofen. Patient reports that she has 2 small children at home who she is constantly lifting. She reports the chest pain occurs on the left side and has radiated into her shoulder.  She reports that she has had episodes of chest pain occurring which last anywhere from 2 seconds to 1 to 2 minutes.  Is not precipitated by activity.  Had any diaphoresis, shortness of breath, nausea, or vomiting.  On arrival patient's vital signs are stable. Past Medical History:  Diagnosis Date   Allergy    goat meat, pollen   Asthma exacerbation, non-allergic, moderate persistent 10/23/2021   Gestational diabetes    Gestational diabetes mellitus 07/25/2020   Gestational diabetes mellitus (GDM) controlled on oral hypoglycemic drug 10/30/2018    Hx of preeclampsia, prior pregnancy, currently pregnant 10/30/2018   Mixed anxiety and depressive disorder, history of 05/11/2018   Pre-diabetes    Prediabetes 07/18/2020   TMJ (dislocation of temporomandibular joint), initial encounter 05/07/2019   Urticaria     Patient Active Problem List   Diagnosis Date Noted   Vaginal discharge 12/10/2022   Dysuria 12/10/2022   Gross hematuria 12/10/2022   Primary hypertension 11/08/2022   Chronic kidney disease (CKD) stage G1/A2, glomerular filtration rate (GFR) equal to or greater than 90 mL/min/1.73 square meter and albuminuria creatinine ratio between 30-299 mg/g 10/28/2022   Sinus tachycardia 01/03/2022   Asthma exacerbation, non-allergic, moderate persistent 10/23/2021   History of breast lump/mass excision 04/29/2021   Type 2 diabetes mellitus with diabetic chronic kidney disease (HCC) 12/11/2020   Anxiety 07/25/2020   Breast pain 06/20/2020   Bilateral low back pain without sciatica 02/26/2019   GERD (gastroesophageal reflux disease) 02/09/2019   History of depression 11/02/2018   Allergy to shrimp 05/21/2018   History of pre-eclampsia 05/18/2018   History of gestational diabetes 05/11/2018   Mixed anxiety and depressive disorder, history of 05/11/2018   Obesity 02/06/2018   Allergic rhinitis 01/23/2018    Past Surgical History:  Procedure Laterality Date   BREAST LUMPECTOMY WITH RADIOACTIVE SEED LOCALIZATION Left 07/28/2019   Procedure: LEFT BREAST LUMPECTOMY WITH RADIOACTIVE SEED LOCALIZATION;  Surgeon: Griselda Miner, MD;  Location: [facility] OR;  Service: General;  Laterality: Left;   WISDOM TOOTH EXTRACTION  OB History     Gravida  3   Para  3   Term  3   Preterm      AB      Living  3      SAB      IAB      Ectopic      Multiple      Live Births  3            Home Medications    Prior to Admission medications   Medication Sig Start Date End Date Taking? Authorizing Provider  Accu-Chek Softclix  Lancets lancets USE AS INSTRUCTED TO CHECK BLOOD SUGARS DAILY 10/28/22   Littie Deeds, MD  atorvastatin (LIPITOR) 10 MG tablet Take 1 tablet (10 mg total) by mouth daily. 01/07/23   Levin Erp, MD  baclofen (LIORESAL) 10 MG tablet Take 1 tablet (10 mg total) by mouth at bedtime. 02/07/23   Levin Erp, MD  Blood Glucose Monitoring Suppl (ACCU-CHEK GUIDE) w/Device KIT Use as directed to check blood glucose 08/22/21   Janit Pagan T, MD  budesonide-formoterol (SYMBICORT) 80-4.5 MCG/ACT inhaler Inhale 2 puffs into the lungs 2 (two) times daily. 01/07/23   Levin Erp, MD  dapagliflozin propanediol (FARXIGA) 10 MG TABS tablet Take 1 tablet (10 mg total) by mouth daily before breakfast. 10/07/22   Lilland, Alana, DO  Dulaglutide (TRULICITY) 0.75 MG/0.5ML SOPN INJECT 0.75 MG SUBCUTANEOUSLY ONCE A WEEK 12/06/22   Levin Erp, MD  fluticasone (FLONASE) 50 MCG/ACT nasal spray Place 2 sprays into both nostrils daily. 01/07/23   Levin Erp, MD  glucose blood (ACCU-CHEK GUIDE) test strip USE AS INSTRUCTED TO CHECK BLOOD SUGARS DAILY. 10/28/22   Littie Deeds, MD  hydrocortisone 1 % ointment APPLY  OINTMENT EXTERNALLY TWICE DAILY 02/07/23   Levin Erp, MD  ibuprofen (ADVIL) 600 MG tablet Take 1 tablet (600 mg total) by mouth every 6 (six) hours as needed. 02/07/23   Levin Erp, MD  levocetirizine (XYZAL) 5 MG tablet Take 1 tablet (5 mg total) by mouth every evening. 01/28/23   Lilland, Percival Spanish, DO  levonorgestrel (MIRENA) 20 MCG/24HR IUD 1 each by Intrauterine route once.    [provider]  lisinopril (ZESTRIL) 5 MG tablet Take 1 tablet (5 mg total) by mouth at bedtime. 01/07/23   Levin Erp, MD  metFORMIN (GLUCOPHAGE-XR) 500 MG 24 hr tablet Take 1 tablet (500 mg total) by mouth daily with breakfast. 10/07/22   Lilland, Alana, DO  montelukast (SINGULAIR) 10 MG tablet Take 1 tablet (10 mg total) by mouth at bedtime. 10/07/22   Lilland, Alana, DO  omeprazole (PRILOSEC) 20 MG  capsule Take 1 capsule (20 mg total) by mouth daily. 01/07/23   Levin Erp, MD  polyethylene glycol powder (GLYCOLAX/MIRALAX) 17 GM/SCOOP powder Take 17 g by mouth daily. 11/08/22   Lilland, Alana, DO  terconazole (TERAZOL 7) 0.4 % vaginal cream Place 1 applicator vaginally at bedtime. Patient not taking: Reported on 10/07/2022 06/12/22   Nestor Ramp, MD  VENTOLIN HFA 108 [phone] Base) MCG/ACT inhaler INHALE 1 TO 2 PUFFS BY MOUTH EVERY 6 HOURS AS NEEDED FOR WHEEZING FOR SHORTNESS OF BREATH 01/27/23   Evelena Leyden, DO    Family History Family History  Problem Relation Age of Onset   Diabetes Mother    Heart disease Mother    Diabetes Father    Heart disease Father    Kidney disease Father    Kidney disease Brother     Social History Social History  Tobacco Use   Smoking status: Never    Passive exposure: Never   Smokeless tobacco: Never  Vaping Use   Vaping Use: Never used  Substance Use Topics   Alcohol use: Never   Drug use: Never     Allergies   Beef-derived products, Goat-derived products, and Shrimp [shellfish allergy]   Review of Systems Review of Systems Pertinent negatives listed in HPI  Physical Exam Triage Vital Signs ED Triage Vitals  Enc Vitals Group     BP --      Pulse Rate 02/14/23 1035 94     Resp 02/14/23 1035 18     Temp 02/14/23 1035 98.2 F (36.8 C)     Temp Source 02/14/23 1035 Oral     SpO2 02/14/23 1035 98 %     Weight --      Height --      Head Circumference --      Peak Flow --      Pain Score 02/14/23 1036 0     Pain Loc --      Pain Edu? --      Excl. in GC? --    No data found.  Updated Vital Signs BP 123/77 (BP Location: Right Arm)   Pulse 94   Temp 98.2 F (36.8 C) (Oral)   Resp 18   SpO2 98%   Visual Acuity Right Eye Distance:   Left Eye Distance:   Bilateral Distance:    Right Eye Near:   Left Eye Near:    Bilateral Near:     Physical Exam Constitutional:      Appearance: She is well-developed.  HENT:      Head: Normocephalic and atraumatic.     Right Ear: Hearing, tympanic membrane, ear canal and external ear normal.     Left Ear: Hearing, tympanic membrane, ear canal and external ear normal.     Nose: Congestion and rhinorrhea present.     Mouth/Throat:     Lips: Pink.     Tongue: Lesions present.     Pharynx: Oropharynx is clear. Uvula midline.  Eyes:     Extraocular Movements: Extraocular movements intact.     Pupils: Pupils are equal, round, and reactive to light.  Cardiovascular:     Rate and Rhythm: Normal rate and regular rhythm.  Pulmonary:     Effort: Pulmonary effort is normal.     Breath sounds: Normal breath sounds.  Skin:    General: Skin is warm and dry.  Neurological:     General: No focal deficit present.     Mental Status: She is alert.  Psychiatric:        Mood and Affect: Mood is anxious.      UC Treatments / Results  Labs (all labs ordered are listed, but only abnormal results are displayed) Labs Reviewed - No data to display  EKG   Radiology No results found.  Procedures Procedures (including critical care time)  Medications Ordered in UC Medications - No data to display  Initial Impression / Assessment and Plan / UC Course  I have reviewed the triage vital signs and the nursing notes.  Pertinent labs & imaging results that were available during my care of the patient were reviewed by me and considered in my medical decision making (see chart for details).    Patient presents today with a 2-day history of intermittent chest pain which has resolved at present.  EKG obtained did not show any acute ST changes.  Patient is also not tachycardic.  Patient is recently seen PCP and is scheduled to have a Holter monitor delivered to her home.  Patient is significantly anxious during evaluation and suspect anxiety is a component to overall chest pain.  Patient also expressed concern related to back pain, left arm pain, and seasonal allergy symptoms.   All of which have been addressed by her primary care doctor and she has received prescriptions for each during her last primary care visit.  Reviewed all recommendations made by primary care doctor and advised patient also to use Flonase to help with nasal congestion and coughing.  Also advised patient if the chest pain becomes more persistent, heavy and is accompanied with shortness of breath, or diaphoresis this is indication to go to the nearest emergency department.  Also provided education regarding chest pain.  Verbalized understanding and agreement with plan today. Final Clinical Impressions(s) / UC Diagnoses   Final diagnoses:  Atypical chest pain  Acute rhinitis  Acute pain of left shoulder  Left arm pain  Other back pain, unspecified chronicity     Discharge Instructions      Your EKG was unremarkable and did not indicate a reason for your intermittent chest pain.  As discussed if you continue to have persistent chest pain or heaviness of the chest with shortness of breath, dizziness weakness labs are all indications to go immediately to the emergency department.  As discussed your primary care doctor has ordered a heart monitor which should be arriving to you soon follow-up with your primary care doctor if this has not been received within the next 3 to 5 days.  You also have a message on your MyChart from heart care which explains to you how to apply the monitor and there is also a phone number there for you to reference if you have any problems. Continue your Flonase and levocetirizine for management of your allergy symptoms.  Continue baclofen for shoulder arm and back pain.  Follow-up with primary care doctor as needed.     ED Prescriptions   None    PDMP not reviewed this encounter.   Bing Neighbors, NP 02/14/23 1141

## 2023-02-14 NOTE — ED Triage Notes (Signed)
Pt c/o lt sided chest pain radiating down lt arm x2days. Denies SOB or diaphoresis. Denies taking meds for pain. States sometimes she gets dizzy when standing from a sitting position. States sometimes has upper back pain.

## 2023-02-17 ENCOUNTER — Telehealth: Payer: Self-pay | Admitting: *Deleted

## 2023-02-17 NOTE — Telephone Encounter (Signed)
Patient concerned of cost.  Recently found out not covered by Medicaid.  Referred to [employer] at [phone] to inquire about financial assistance program.  Will inquire about physician interpretation charge with self pay discount and what she will need to do to have the discount applied.

## 2023-02-18 ENCOUNTER — Other Ambulatory Visit: Payer: Self-pay | Admitting: Family Medicine

## 2023-02-18 DIAGNOSIS — R002 Palpitations: Secondary | ICD-10-CM

## 2023-02-18 DIAGNOSIS — J4541 Moderate persistent asthma with (acute) exacerbation: Secondary | ICD-10-CM

## 2023-02-19 ENCOUNTER — Telehealth: Payer: Self-pay | Admitting: *Deleted

## 2023-02-19 NOTE — Telephone Encounter (Signed)
Patient states her medicaid will be active until the end of the month.  Answered patients questions regarding how long to wear the monitor, how to return the monitor, skin itching and irritation, when her physician will receive results.

## 2023-02-22 DIAGNOSIS — F411 Generalized anxiety disorder: Secondary | ICD-10-CM | POA: Diagnosis not present

## 2023-02-27 DIAGNOSIS — F411 Generalized anxiety disorder: Secondary | ICD-10-CM | POA: Diagnosis not present

## 2023-02-28 ENCOUNTER — Ambulatory Visit: Payer: Medicaid Other | Admitting: Family Medicine

## 2023-02-28 ENCOUNTER — Encounter: Payer: Self-pay | Admitting: Family Medicine

## 2023-02-28 VITALS — BP 113/66 | HR 103 | Ht 64.0 in | Wt 162.8 lb

## 2023-02-28 DIAGNOSIS — F419 Anxiety disorder, unspecified: Secondary | ICD-10-CM

## 2023-02-28 DIAGNOSIS — I10 Essential (primary) hypertension: Secondary | ICD-10-CM | POA: Diagnosis not present

## 2023-02-28 DIAGNOSIS — E119 Type 2 diabetes mellitus without complications: Secondary | ICD-10-CM

## 2023-02-28 DIAGNOSIS — E1122 Type 2 diabetes mellitus with diabetic chronic kidney disease: Secondary | ICD-10-CM | POA: Diagnosis not present

## 2023-02-28 DIAGNOSIS — J4541 Moderate persistent asthma with (acute) exacerbation: Secondary | ICD-10-CM

## 2023-02-28 DIAGNOSIS — N181 Chronic kidney disease, stage 1: Secondary | ICD-10-CM | POA: Diagnosis not present

## 2023-02-28 MED ORDER — ACCU-CHEK GUIDE W/DEVICE KIT: PACK | 0 refills | Status: DC

## 2023-02-28 MED ORDER — BUDESONIDE-FORMOTEROL FUMARATE 80-4.5 MCG/ACT IN AERO: 2.0000 | INHALATION_SPRAY | Freq: Two times a day (BID) | RESPIRATORY_TRACT | 3 refills | Status: DC

## 2023-02-28 MED ORDER — DAPAGLIFLOZIN PROPANEDIOL 10 MG PO TABS: 10.0000 mg | ORAL_TABLET | Freq: Every day | ORAL | 3 refills | Status: DC

## 2023-02-28 MED ORDER — METFORMIN HCL ER 500 MG PO TB24: 500.0000 mg | ORAL_TABLET | Freq: Every day | ORAL | 3 refills | Status: DC

## 2023-02-28 MED ORDER — HYDROXYZINE HCL 10 MG PO TABS: 10.0000 mg | ORAL_TABLET | Freq: Every evening | ORAL | 0 refills | Status: AC | PRN

## 2023-02-28 MED ORDER — LISINOPRIL 5 MG PO TABS: 5.0000 mg | ORAL_TABLET | Freq: Every day | ORAL | 0 refills | Status: DC

## 2023-02-28 MED ORDER — ATORVASTATIN CALCIUM 10 MG PO TABS: 10.0000 mg | ORAL_TABLET | Freq: Every day | ORAL | 0 refills | Status: DC

## 2023-02-28 MED ORDER — TRULICITY 0.75 MG/0.5ML ~~LOC~~ SOAJ: SUBCUTANEOUS | 1 refills | Status: DC

## 2023-02-28 MED ORDER — VENTOLIN HFA 108 (90 BASE) MCG/ACT IN AERS: INHALATION_SPRAY | RESPIRATORY_TRACT | 0 refills | Status: DC

## 2023-02-28 NOTE — Assessment & Plan Note (Signed)
Patient with persistent anxiety, worsened with upcoming move as they are moving across the states to [place].  I feel that it is appropriate to treat with as needed hydroxyzine at this time, which was discussed at length with patient. - Hydroxyzine 10 mg 3 times daily as needed, recommended patient start off with nightly dosing

## 2023-02-28 NOTE — Assessment & Plan Note (Signed)
Stable on lisinopril 5 mg daily.  Well-controlled here in clinic today. - Refill of lisinopril 5 mg provided

## 2023-02-28 NOTE — Assessment & Plan Note (Addendum)
Patient has had significant improvement after starting Trulicity.  Patient needing refills of her medications. - Refill Trulicity 0.75 mg weekly - Refill Farxiga 10 mg daily - Refill metformin 500 mg daily - Refill atorvastatin 10 mg daily - Glucometer resent

## 2023-02-28 NOTE — Patient Instructions (Signed)
I have sent in refills of all your medications, just let our office know if you need any further refills before you move.  For your anxiety, I have sent in a medication called hydroxyzine (Atarax/Vistaril or other names for it), you can take this up to 3 times a day as needed but I would start off with just taking at night as needed.

## 2023-02-28 NOTE — Progress Notes (Signed)
    SUBJECTIVE:   CHIEF COMPLAINT / HPI:   Medication refills - Patient is moving to [place] in the coming months and would like medication refills - She is needing her diabetes and blood pressure medications  Diabetes - feels excited that her A1c has been much improved - Has not had any hypoglycemic symptoms - Has had weight loss with Trulicity that has her very satisfied  Asthma - Has been taking her daily inhaler as prescribed - Needs a refill on her albuterol, uses infrequently   PERTINENT  PMH / PSH: Reviewed  OBJECTIVE:   BP 113/66   Pulse (!) 103   Ht 5\' 4"  (1.626 m)   Wt 162 lb 12.8 oz (73.8 kg)   SpO2 100%   BMI 27.94 kg/m   Gen: well-appearing, NAD CV: Tachycardia, no m/r/g appreciated, no peripheral edema Pulm: CTAB, no wheezes/crackles  ASSESSMENT/PLAN:   Type 2 diabetes mellitus with diabetic chronic kidney disease (HCC) Patient has had significant improvement after starting Trulicity.  Patient needing refills of her medications. - Refill Trulicity 0.75 mg weekly - Refill Farxiga 10 mg daily - Refill metformin 500 mg daily - Refill atorvastatin 10 mg daily - Glucometer resent  Primary hypertension Stable on lisinopril 5 mg daily.  Well-controlled here in clinic today. - Refill of lisinopril 5 mg provided  Asthma exacerbation, non-allergic, moderate persistent Currently stable, reports compliance with Symbicort. - Symbicort twice daily daily - Albuterol inhaler refilled  Anxiety Patient with persistent anxiety, worsened with upcoming move as they are moving across the states to [place].  I feel that it is appropriate to treat with as needed hydroxyzine at this time, which was discussed at length with patient. - Hydroxyzine 10 mg 3 times daily as needed, recommended patient start off with nightly dosing     Evelena Leyden, DO [facility]

## 2023-02-28 NOTE — Assessment & Plan Note (Signed)
Currently stable, reports compliance with Symbicort. - Symbicort twice daily daily - Albuterol inhaler refilled

## 2023-03-06 ENCOUNTER — Encounter: Payer: Medicaid Other | Admitting: Dietician

## 2023-03-10 ENCOUNTER — Ambulatory Visit: Payer: Medicaid Other | Admitting: Student

## 2023-03-10 ENCOUNTER — Encounter: Payer: Self-pay | Admitting: Student

## 2023-03-10 ENCOUNTER — Other Ambulatory Visit: Payer: Self-pay

## 2023-03-10 VITALS — BP 119/82 | HR 108 | Ht 64.0 in | Wt 160.2 lb

## 2023-03-10 DIAGNOSIS — R000 Tachycardia, unspecified: Secondary | ICD-10-CM | POA: Diagnosis not present

## 2023-03-10 DIAGNOSIS — J301 Allergic rhinitis due to pollen: Secondary | ICD-10-CM

## 2023-03-10 DIAGNOSIS — N181 Chronic kidney disease, stage 1: Secondary | ICD-10-CM

## 2023-03-10 DIAGNOSIS — E1122 Type 2 diabetes mellitus with diabetic chronic kidney disease: Secondary | ICD-10-CM | POA: Diagnosis not present

## 2023-03-10 DIAGNOSIS — F419 Anxiety disorder, unspecified: Secondary | ICD-10-CM

## 2023-03-10 DIAGNOSIS — R42 Dizziness and giddiness: Secondary | ICD-10-CM

## 2023-03-10 MED ORDER — ACCU-CHEK GUIDE W/DEVICE KIT
PACK | 0 refills | Status: AC
Start: 2023-03-10 — End: ?

## 2023-03-10 MED ORDER — HYDROCORTISONE 1 % EX OINT: TOPICAL_OINTMENT | CUTANEOUS | 0 refills | Status: DC

## 2023-03-10 MED ORDER — LEVOCETIRIZINE DIHYDROCHLORIDE 5 MG PO TABS: 5.0000 mg | ORAL_TABLET | Freq: Every evening | ORAL | 3 refills | Status: DC

## 2023-03-10 MED ORDER — ESCITALOPRAM OXALATE 5 MG PO TABS
5.0000 mg | ORAL_TABLET | Freq: Every day | ORAL | 0 refills | Status: DC
Start: 2023-03-10 — End: 2023-04-04

## 2023-03-10 NOTE — Patient Instructions (Signed)
It was great to see you! Thank you for allowing me to participate in your care!   Our plans for today:  - Please follow up in 1 month for your mood follow up - I will touch base with case workers and pharmacy team - Stay hydrated in this meantime  Take care and seek immediate care sooner if you develop any concerns.  Levin Erp, MD

## 2023-03-10 NOTE — Assessment & Plan Note (Signed)
LTM with no arrhythmias which was discussed with patient.

## 2023-03-10 NOTE — Assessment & Plan Note (Addendum)
Was previously started on hydroxyzine however she is amenable to starting Lexapro today given significant symptoms of anxiety.  Will start with low-dose at 5 mg daily.  She states she is moving to [place] in about 3 months so we have some time to titrate up as needed.  Will follow-up in 1 month for mood symptoms. -Continue as needed hydroxyzine -Start Lexapro 5 mg -1 month follow-up -CCM referral already in place, will reach out to pharmacy team in terms of medication options

## 2023-03-10 NOTE — Progress Notes (Signed)
    SUBJECTIVE:   CHIEF COMPLAINT / HPI: Dizziness with standing up/high heart rate  Dizziness when standing up he states that she intermittently feels like she is dizzy when she stands up too quickly.  States that it feels like she has some black spots in her vision feels like she has to pass out but has not passed out.    Orthostatic vitals taken today: Lying 111/73 pulse 106 Sitting 109/80 pulse 104 Standing 104/86 pulse 122  Mentions that she is worried that her heart rate gets very high from time to time.  We discussed that her long-term monitor is very reassuring and was sinus rhythm without any arrhythmias.  We also discussed that high heart rates can come from different things such as stress and anxiety.  She states that she has a lot of anxiety around taking care of 2 children who have autism, she states that she used to have a job from the country she is from however after moving to this country does not have a job and is feeling very stressed and worried about this.  She states that she is worried about affording medications after her Medicaid expires on the 30th of this month especially Trulicity.  She denies having taken antidepressants before in the past.  Denies any symptoms of mania such as loss of sleep for days or risky behaviors.  She is moving to [place] in about 3 months for husband's new job  PERTINENT  PMH / PSH: T2DM, CKD  OBJECTIVE:   BP 119/82   Pulse (!) 108   Ht 5\' 4"  (1.626 m)   Wt 160 lb 3.2 oz (72.7 kg)   SpO2 100%   BMI 27.50 kg/m    General: NAD, awake, alert, responsive to questions Head: Normocephalic atraumatic CV: Mildly tachycardic rate normal rhythm no murmurs rubs or gallops Respiratory: Clear to ausculation bilaterally, no wheezes rales or crackles, chest rises symmetrically,  no increased work of breathing Extremities: Moves upper and lower extremities freely, no edema in LE  ASSESSMENT/PLAN:   Anxiety Was previously started on  hydroxyzine however she is amenable to starting Lexapro today given significant symptoms of anxiety.  Will start with low-dose at 5 mg daily.  She states she is moving to [place] in about 3 months so we have some time to titrate up as needed.  Will follow-up in 1 month for mood symptoms. -Continue as needed hydroxyzine -Start Lexapro 5 mg -1 month follow-up -CCM referral already in place, will reach out to pharmacy team in terms of medication options   Dizziness No orthostatic hypotension on orthostatic vitals.  Discussed with her to increase hydration in the meantime during summer and take some time to stand before seated for long period of time.  She is on a very low-dose of lisinopril for renal protection which I do not think needs adjustment right now consider halving this in future.  Levin Erp, MD [facility]

## 2023-03-12 ENCOUNTER — Encounter: Payer: Self-pay | Admitting: Dietician

## 2023-03-12 ENCOUNTER — Other Ambulatory Visit: Payer: Self-pay

## 2023-03-12 ENCOUNTER — Encounter: Payer: Medicaid Other | Attending: Family Medicine | Admitting: Dietician

## 2023-03-12 ENCOUNTER — Encounter: Payer: Self-pay | Admitting: Student

## 2023-03-12 ENCOUNTER — Ambulatory Visit: Payer: Medicaid Other | Admitting: Family Medicine

## 2023-03-12 VITALS — BP 132/73 | HR 120 | Wt 159.2 lb

## 2023-03-12 DIAGNOSIS — K219 Gastro-esophageal reflux disease without esophagitis: Secondary | ICD-10-CM

## 2023-03-12 DIAGNOSIS — F419 Anxiety disorder, unspecified: Secondary | ICD-10-CM

## 2023-03-12 DIAGNOSIS — E1122 Type 2 diabetes mellitus with diabetic chronic kidney disease: Secondary | ICD-10-CM

## 2023-03-12 DIAGNOSIS — R5383 Other fatigue: Secondary | ICD-10-CM | POA: Diagnosis not present

## 2023-03-12 DIAGNOSIS — J301 Allergic rhinitis due to pollen: Secondary | ICD-10-CM | POA: Diagnosis not present

## 2023-03-12 DIAGNOSIS — R000 Tachycardia, unspecified: Secondary | ICD-10-CM | POA: Diagnosis present

## 2023-03-12 DIAGNOSIS — E119 Type 2 diabetes mellitus without complications: Secondary | ICD-10-CM | POA: Insufficient documentation

## 2023-03-12 DIAGNOSIS — E559 Vitamin D deficiency, unspecified: Secondary | ICD-10-CM

## 2023-03-12 MED ORDER — FLUTICASONE PROPIONATE 50 MCG/ACT NA SUSP: 2.0000 | Freq: Every day | NASAL | 6 refills | Status: DC

## 2023-03-12 MED ORDER — TERCONAZOLE 0.4 % VA CREA: 1.0000 | TOPICAL_CREAM | Freq: Every day | VAGINAL | 0 refills | Status: DC

## 2023-03-12 NOTE — Progress Notes (Signed)
Diabetes Self-Management Education  Visit Type: Follow-up  Appt. Start Time: 0915 Appt. End Time: 0102  03/12/2023  Cindy Howell, identified by name and date of birth, is a 37 y.o. female with a diagnosis of Diabetes: Type 2.   This is a video visit.  Patient location: home Provider location: office  ASSESSMENT  Pt states she switched from white rice to brown rice, and white bread to whole wheat.   Pt states she's been walking 40 minutes 4 times a week. Pt states she wants to try some home workouts.   Pt states she feels like her heart has been racing sometimes and she feels tired in the morning like there is a weight on her chest (suspects it is anxiety).   Pt states she has not been eating a lot of vegetables. Assessed a list of non-starchy vegetables with pt. Accepted vegetables: eggplant, cucumber, cauliflower, onion, lettuce, spinach, avocado, mushroom, tomato.   Pt states she is unsure if she can continue to afford trulicity. Pt last A1c 2 months ago 5.6%. Pt states she has not had low blood sugar recently.   There were no vitals taken for this visit. There is no height or weight on file to calculate BMI.   Diabetes Self-Management Education - 03/12/23 0900       Visit Information   Visit Type Follow-up      Initial Visit   Diabetes Type Type 2    Are you currently following a meal plan? Yes    Are you taking your medications as prescribed? Yes      Health Coping   How would you rate your overall health? Good      Psychosocial Assessment   Patient Belief/Attitude about Diabetes Motivated to manage diabetes    What is the hardest part about your diabetes right now, causing you the most concern, or is the most worrisome to you about your diabetes?   Making healty food and beverage choices    Self-care barriers None    Self-management support Doctor's office    Other persons present Patient    Patient Concerns Nutrition/Meal planning    Special Needs None     Preferred Learning Style No preference indicated    Learning Readiness Ready    How often do you need to have someone help you when you read instructions, pamphlets, or other written materials from your doctor or pharmacy? 1 - Never    What is the last grade level you completed in school? masters      Pre-Education Assessment   Patient understands the diabetes disease and treatment process. Needs Review    Patient understands incorporating nutritional management into lifestyle. Needs Review    Patient undertands incorporating physical activity into lifestyle. Needs Review    Patient understands using medications safely. Comprehends key points    Patient understands monitoring blood glucose, interpreting and using results Comprehends key points    Patient understands prevention, detection, and treatment of acute complications. Needs Review    Patient understands prevention, detection, and treatment of chronic complications. Needs Review    Patient understands how to develop strategies to address psychosocial issues. Needs Review    Patient understands how to develop strategies to promote health/change behavior. Needs Review      Complications   How often do you check your blood sugar? 1-2 times/day    Fasting Blood glucose range (mg/dL) 72-536    Postprandial Blood glucose range (mg/dL) 644-034      Dietary  Intake   Breakfast 1 egg with whole wheat bread and hot tea    Snack (morning) nuts    Lunch brown rice with meat or fish    Snack (afternoon) tea with milk and stevia and biscuits    Dinner 1-2 slices bread and fish/meat and sometimes vegetables    Snack (evening) almonds    Beverage(s) water, tea with milk      Activity / Exercise   Activity / Exercise Type Light (walking / raking leaves)    How many days per week do you exercise? 4    How many minutes per day do you exercise? 40    Total minutes per week of exercise 160      Patient Education   Previous Diabetes Education  Yes (please comment)    Disease Pathophysiology Explored patient's options for treatment of their diabetes    Healthy Eating Role of diet in the treatment of diabetes and the relationship between the three main macronutrients and blood glucose level;Plate Method;Meal timing in regards to the patients' current diabetes medication.;Meal options for control of blood glucose level and chronic complications.    Being Active Role of exercise on diabetes management, blood pressure control and cardiac health.    Medications Reviewed patients medication for diabetes, action, purpose, timing of dose and side effects.;Reviewed medication adjustment guidelines for hyperglycemia and sick days.    Monitoring Identified appropriate SMBG and/or A1C goals.    Chronic complications Relationship between chronic complications and blood glucose control    Diabetes Stress and Support Identified and addressed patients feelings and concerns about diabetes;Worked with patient to identify barriers to care and solutions;Role of stress on diabetes    Lifestyle and Health Coping Lifestyle issues that need to be addressed for better diabetes care      Individualized Goals (developed by patient)   Nutrition General guidelines for healthy choices and portions discussed    Physical Activity Exercise 5-7 days per week    Medications take my medication as prescribed    Monitoring  Test my blood glucose as discussed    Problem Solving Eating Pattern;Sleep Pattern    Reducing Risk examine blood glucose patterns;do foot checks daily;treat hypoglycemia with 15 grams of carbs if blood glucose less than 70mg /dL    Health Coping Ask for help with psychological, social, or emotional issues      Patient Self-Evaluation of Goals - Patient rates self as meeting previously set goals (% of time)   Nutrition 50 - 75 % (half of the time)    Physical Activity >75% (most of the time)    Medications >75% (most of the time)    Monitoring >75%  (most of the time)    Problem Solving and behavior change strategies  50 - 75 % (half of the time)    Reducing Risk (treating acute and chronic complications) 50 - 75 % (half of the time)    Health Coping 50 - 75 % (half of the time)      Post-Education Assessment   Patient understands the diabetes disease and treatment process. Comprehends key points    Patient understands incorporating nutritional management into lifestyle. Comprehends key points    Patient undertands incorporating physical activity into lifestyle. Comprehends key points    Patient understands using medications safely. Comphrehends key points    Patient understands monitoring blood glucose, interpreting and using results Comprehends key points    Patient understands prevention, detection, and treatment of acute complications. Comprehends key points  Patient understands prevention, detection, and treatment of chronic complications. Comprehends key points    Patient understands how to develop strategies to address psychosocial issues. Comprehends key points    Patient understands how to develop strategies to promote health/change behavior. Comprehends key points      Outcomes   Expected Outcomes Demonstrated interest in learning. Expect positive outcomes    Future DMSE 2 months    Program Status Not Completed      Subsequent Visit   Since your last visit have you continued or begun to take your medications as prescribed? Yes    Since your last visit, are you checking your blood glucose at least once a day? Yes             Individualized Plan for Diabetes Self-Management Training:   Learning Objective:  Patient will have a greater understanding of diabetes self-management. Patient education plan is to attend individual and/or group sessions per assessed needs and concerns.   Plan:   Patient Instructions  New Goal: Aim to have at least 1 serving of non-starchy vegetables every day.   Your accepted  vegetables: eggplant, cucumber, cauliflower, onion, lettuce, spinach, avocado, mushroom, tomato.   Recommend having vitamin D and hemoglobin checked.   Youtube workout videos: Look up "beginner no equipment needed workout" or "beginner body weight workout" or "chair workout".  Whole grain examples:  Barley. Bulgur, also called cracked wheat. Farro. Millet. Quinoa. Black rice. Brown rice. Red rice. Wild rice. Oatmeal. Popcorn. Whole-wheat flour. Whole-wheat bread, pasta or crackers.  Expected Outcomes:  Demonstrated interest in learning. Expect positive outcomes  Education material provided:  no handouts provided (video visit)  If problems or questions, patient to contact team via:  Phone  Future DSME appointment: 2 months

## 2023-03-12 NOTE — Assessment & Plan Note (Signed)
Patient endorses increased fatigue and would like hemoglobin and vitamin D checked. She states she has been under increased stress recently so this could be contributing and inadequate sleep. Will check CBC, Vit D and recommend a sleep log and probing further at follow up

## 2023-03-12 NOTE — Assessment & Plan Note (Signed)
Was seen for this 2 days prior. Likely in the setting of increased anxiety and stress as LTM without arrhythmias which was discussed at last visit. TSH checked a few months ago and normal. Just started on Lexapro 5mg . Follow up scheduled and will see how she does with this. Would also recommend addition of beta blocker if still not improved at follow up

## 2023-03-12 NOTE — Progress Notes (Signed)
    SUBJECTIVE:   CHIEF COMPLAINT / HPI:   Patient presents for follow up  Was seen in clinic 2 days ago for dizziness when standing and vision changes.  Orthostatics negative. Elevated heart rate was deemed likely in the setting of increased anxiety and recent stressors.  You started on Lexapro 5 mg and advised to continue hydroxyzine as needed and advised to follow-up in 1 month  Feels fatigued and requests vitamin D and blood work  Feels increased stressed the last couple of weeks.  Has started the Lexapro yesterday   Requests refill of terconazole for vaginal itching and flonase   PERTINENT  PMH / PSH: Reviewed   OBJECTIVE:   BP 132/73   Pulse (!) 120   Wt 159 lb 3.2 oz (72.2 kg)   SpO2 100%   BMI 27.33 kg/m    Physical exam General: anxious, NAD Cardiovascular: Tachycardic, no murmurs Lungs: CTAB. Normal WOB Abdomen: soft, non-distended Skin: warm, dry. No edema  ASSESSMENT/PLAN:   Sinus tachycardia Was seen for this 2 days prior. Likely in the setting of increased anxiety and stress as LTM without arrhythmias which was discussed at last visit. TSH checked a few months ago and normal. Just started on Lexapro 5mg . Follow up scheduled and will see how she does with this. Would also recommend addition of beta blocker if still not improved at follow up  Fatigue Patient endorses increased fatigue and would like hemoglobin and vitamin D checked. She states she has been under increased stress recently so this could be contributing and inadequate sleep. Will check CBC, Vit D and recommend a sleep log and probing further at follow up  Vaginal itching - refilled Terconazole  Seasonal allergies - refilled Flonase    Cora Collum, DO [facility]

## 2023-03-12 NOTE — Patient Instructions (Signed)
It was great seeing you today!  Im sorry you have had increased fatigue and anxiety. We are checking some blood work and I will let you know if anything is abnormal.   Continue your Lexapro and return for your follow up in 1 month  Feel free to call with any questions or concerns at any time, at [phone].   Take care,  Dr. Cora Collum [facility]

## 2023-03-12 NOTE — Patient Instructions (Addendum)
New Goal: Aim to have at least 1 serving of non-starchy vegetables every day.   Your accepted vegetables: eggplant, cucumber, cauliflower, onion, lettuce, spinach, avocado, mushroom, tomato.   Recommend having vitamin D and hemoglobin checked.   Youtube workout videos: Look up "beginner no equipment needed workout" or "beginner body weight workout" or "chair workout".  Whole grain examples:  Barley. Bulgur, also called cracked wheat. Farro. Millet. Quinoa. Black rice. Brown rice. Red rice. Wild rice. Oatmeal. Popcorn. Whole-wheat flour. Whole-wheat bread, pasta or crackers.

## 2023-03-13 ENCOUNTER — Telehealth: Payer: Self-pay

## 2023-03-13 LAB — CBC
Hematocrit: 43.8 % (ref 34.0–46.6)
Hemoglobin: 14.4 g/dL (ref 11.1–15.9)
MCH: 29.6 pg (ref 26.6–33.0)
MCHC: 32.9 g/dL (ref 31.5–35.7)
MCV: 90 fL (ref 79–97)
Platelets: 251 10*3/uL (ref 150–450)
RBC: 4.86 x10E6/uL (ref 3.77–5.28)
RDW: 12.3 % (ref 11.7–15.4)
WBC: 10.5 10*3/uL (ref 3.4–10.8)

## 2023-03-13 LAB — VITAMIN D 25 HYDROXY (VIT D DEFICIENCY, FRACTURES): Vit D, 25-Hydroxy: 26.7 ng/mL — ABNORMAL LOW (ref 30.0–100.0)

## 2023-03-13 MED ORDER — LIRAGLUTIDE 18 MG/3ML ~~LOC~~ SOPN
0.6000 mg | PEN_INJECTOR | Freq: Every day | SUBCUTANEOUS | 3 refills | Status: DC
Start: 2023-03-13 — End: 2023-03-14

## 2023-03-13 NOTE — Telephone Encounter (Signed)
Patient advised.

## 2023-03-13 NOTE — Telephone Encounter (Signed)
Patient calls nurse line requesting a medication to help her sleep.   She requests I send this message to [place]. She reports they discussed this at their recent visit on 6/24.  Will forward to [place].

## 2023-03-13 NOTE — Telephone Encounter (Signed)
Patient calls nurse line requesting lab results from recent visit.   She reports she viewed them on mychart.   She would like a Clinical cytogeneticist message with results interpretation and plan.   Will forward to provider who saw patient.

## 2023-03-14 MED ORDER — VITAMIN D (ERGOCALCIFEROL) 1.25 MG (50000 UNIT) PO CAPS
50000.0000 [IU] | ORAL_CAPSULE | ORAL | 0 refills | Status: DC
Start: 2023-03-14 — End: 2023-04-24

## 2023-03-14 MED ORDER — SEMAGLUTIDE(0.25 OR 0.5MG/DOS) 2 MG/1.5ML ~~LOC~~ SOPN
0.2500 mg | PEN_INJECTOR | SUBCUTANEOUS | 0 refills | Status: DC
Start: 2023-03-14 — End: 2023-04-04

## 2023-03-14 NOTE — Addendum Note (Signed)
Addended by: Levin Erp on: 03/14/2023 10:08 AM   Modules accepted: Orders

## 2023-03-14 NOTE — Addendum Note (Signed)
Addended by: Levin Erp on: 03/14/2023 11:41 AM   Modules accepted: Orders

## 2023-03-16 ENCOUNTER — Other Ambulatory Visit: Payer: Self-pay | Admitting: Family Medicine

## 2023-03-16 DIAGNOSIS — J4541 Moderate persistent asthma with (acute) exacerbation: Secondary | ICD-10-CM

## 2023-03-28 ENCOUNTER — Telehealth: Payer: Self-pay

## 2023-03-28 NOTE — Telephone Encounter (Signed)
Patient LVM on nurse line requesting to speak with PCP about medications.   I attempted to call her back, however no answer.   Will await her return call.

## 2023-03-31 ENCOUNTER — Other Ambulatory Visit (HOSPITAL_COMMUNITY): Payer: Self-pay

## 2023-04-02 ENCOUNTER — Other Ambulatory Visit: Payer: Self-pay

## 2023-04-02 DIAGNOSIS — E119 Type 2 diabetes mellitus without complications: Secondary | ICD-10-CM

## 2023-04-04 ENCOUNTER — Other Ambulatory Visit: Payer: Self-pay

## 2023-04-04 ENCOUNTER — Other Ambulatory Visit: Payer: Self-pay | Admitting: Student

## 2023-04-04 DIAGNOSIS — N181 Chronic kidney disease, stage 1: Secondary | ICD-10-CM

## 2023-04-04 MED ORDER — METFORMIN HCL ER 500 MG PO TB24
500.0000 mg | ORAL_TABLET | Freq: Every day | ORAL | 0 refills | Status: DC
  Filled 2023-04-04: qty 90, 90d supply, fill #0

## 2023-04-04 MED ORDER — SEMAGLUTIDE(0.25 OR 0.5MG/DOS) 2 MG/1.5ML ~~LOC~~ SOPN: 0.2500 mg | PEN_INJECTOR | SUBCUTANEOUS | 3 refills | Status: DC

## 2023-04-04 MED ORDER — LEVOCETIRIZINE DIHYDROCHLORIDE 5 MG PO TABS
5.0000 mg | ORAL_TABLET | Freq: Every evening | ORAL | 3 refills | Status: AC
Start: 2023-04-04 — End: ?
  Filled 2023-04-04: qty 90, 90d supply, fill #0
  Filled 2023-06-26: qty 90, 90d supply, fill #1

## 2023-04-04 MED ORDER — OMEPRAZOLE 20 MG PO CPDR
20.0000 mg | DELAYED_RELEASE_CAPSULE | Freq: Every day | ORAL | 0 refills | Status: AC
Start: 2023-04-04 — End: ?
  Filled 2023-04-04 – 2023-06-30 (×3): qty 30, 30d supply, fill #0

## 2023-04-04 MED ORDER — MONTELUKAST SODIUM 10 MG PO TABS
10.0000 mg | ORAL_TABLET | Freq: Every day | ORAL | 0 refills | Status: DC
  Filled 2023-04-04 (×2): qty 90, 90d supply, fill #0

## 2023-04-04 MED ORDER — SEMAGLUTIDE(0.25 OR 0.5MG/DOS) 2 MG/1.5ML ~~LOC~~ SOPN
0.2500 mg | PEN_INJECTOR | SUBCUTANEOUS | 3 refills | Status: DC
Start: 2023-04-04 — End: 2023-04-04

## 2023-04-04 MED ORDER — SEMAGLUTIDE(0.25 OR 0.5MG/DOS) 2 MG/3ML ~~LOC~~ SOPN
0.2500 mg | PEN_INJECTOR | SUBCUTANEOUS | 0 refills | Status: DC
Start: 2023-04-04 — End: 2023-04-04
  Filled 2023-04-04: qty 3, 56d supply, fill #0

## 2023-04-04 MED ORDER — DAPAGLIFLOZIN PROPANEDIOL 10 MG PO TABS
10.0000 mg | ORAL_TABLET | Freq: Every day | ORAL | 0 refills | Status: DC
Start: 2023-04-04 — End: 2023-04-07
  Filled 2023-04-04: qty 90, 90d supply, fill #0
  Filled 2023-04-07: qty 30, 30d supply, fill #0

## 2023-04-04 MED ORDER — ATORVASTATIN CALCIUM 10 MG PO TABS
10.0000 mg | ORAL_TABLET | Freq: Every day | ORAL | 0 refills | Status: DC
Start: 2023-04-04 — End: 2023-04-24
  Filled 2023-04-04 (×2): qty 90, 90d supply, fill #0

## 2023-04-04 MED ORDER — TRULICITY 0.75 MG/0.5ML ~~LOC~~ SOAJ
0.7500 mg | SUBCUTANEOUS | 2 refills | Status: DC
Start: 2023-04-04 — End: 2023-04-24
  Filled 2023-04-04: qty 2, 28d supply, fill #0

## 2023-04-04 MED ORDER — ESCITALOPRAM OXALATE 5 MG PO TABS
5.0000 mg | ORAL_TABLET | Freq: Every day | ORAL | 0 refills | Status: DC
Start: 2023-04-04 — End: 2023-04-24
  Filled 2023-04-04 (×2): qty 30, 30d supply, fill #0

## 2023-04-04 MED ORDER — LISINOPRIL 5 MG PO TABS
5.0000 mg | ORAL_TABLET | Freq: Every day | ORAL | 0 refills | Status: DC
Start: 2023-04-04 — End: 2023-04-24
  Filled 2023-04-04 (×2): qty 90, 90d supply, fill #0

## 2023-04-04 NOTE — Addendum Note (Signed)
Addended by: Levin Erp on: 04/04/2023 10:44 PM   Modules accepted: Orders

## 2023-04-04 NOTE — Progress Notes (Signed)
Placed RX for Ozempic, patient can get free med for Trulicity but could qualify for ozempic.

## 2023-04-04 NOTE — Addendum Note (Signed)
Addended by: Levin Erp on: 04/04/2023 03:30 AM   Modules accepted: Orders

## 2023-04-07 ENCOUNTER — Other Ambulatory Visit: Payer: Self-pay

## 2023-04-07 ENCOUNTER — Telehealth: Payer: Self-pay

## 2023-04-07 DIAGNOSIS — E119 Type 2 diabetes mellitus without complications: Secondary | ICD-10-CM

## 2023-04-07 DIAGNOSIS — J4541 Moderate persistent asthma with (acute) exacerbation: Secondary | ICD-10-CM

## 2023-04-07 NOTE — Telephone Encounter (Signed)
Received call from [facility] Department regarding patient. They have multiple issues needing clarification/ new orders.  They need new prescription for Farxiga to be sent to the [facility] pharmacy. This was previously sent to the [facility] pharmacy. They need a new prescription for Breyna 160-4.5 mcg inhaler. This is the equivalent to symbicort that is covered through the medication assistance program.  They are also needing a new prescription for Ozempic. Ozempic will be covered through medication assistance, however, 1 mg max needs to be removed from directions, as this is the 0.25-0.5 mg pen.  *Dr. Elliot Gurney faxed prescriptions for these medications to the Health Department, however, they need prescription sent with specific dosage instructions. I have pended these orders. Forwarding to Dr. Elliot Gurney to resend. Thanks.   Veronda Prude, RN

## 2023-04-08 ENCOUNTER — Other Ambulatory Visit: Payer: Self-pay

## 2023-04-08 MED ORDER — BUDESONIDE-FORMOTEROL FUMARATE 160-4.5 MCG/ACT IN AERO
2.0000 | INHALATION_SPRAY | Freq: Every day | RESPIRATORY_TRACT | 12 refills | Status: AC
Start: 2023-04-08 — End: ?

## 2023-04-08 MED ORDER — SEMAGLUTIDE(0.25 OR 0.5MG/DOS) 2 MG/1.5ML ~~LOC~~ SOPN
0.2500 mg | PEN_INJECTOR | SUBCUTANEOUS | 0 refills | Status: DC
Start: 2023-04-08 — End: 2023-04-17

## 2023-04-08 MED ORDER — DAPAGLIFLOZIN PROPANEDIOL 10 MG PO TABS: 10.0000 mg | ORAL_TABLET | Freq: Every day | ORAL | 0 refills | Status: DC

## 2023-04-08 NOTE — Telephone Encounter (Signed)
Received returned call from pharmacy. They report that recommended dosing directions for Breyna inhaler is two puffs BID. Current prescription is for two puffs once daily.   If patient should be on BID dosing, please let me know and I can call pharmacy and provide updated directions.   Thanks.   Veronda Prude, RN

## 2023-04-10 ENCOUNTER — Telehealth: Payer: Self-pay

## 2023-04-10 ENCOUNTER — Other Ambulatory Visit: Payer: Self-pay

## 2023-04-10 ENCOUNTER — Other Ambulatory Visit (HOSPITAL_COMMUNITY): Payer: Self-pay

## 2023-04-10 NOTE — Telephone Encounter (Signed)
Returned call to [facility] and provided with verbal orders for 2 puffs BID.   Veronda Prude, RN

## 2023-04-10 NOTE — Telephone Encounter (Signed)
Rec'd email/fax of signed patient portion of [employer] app. According to previous notes, patient may be getting assistance with this company thru Health Dept.   Faxed page to Health Dept.

## 2023-04-11 ENCOUNTER — Other Ambulatory Visit: Payer: Self-pay

## 2023-04-15 ENCOUNTER — Other Ambulatory Visit: Payer: Self-pay

## 2023-04-17 ENCOUNTER — Other Ambulatory Visit: Payer: Self-pay

## 2023-04-17 ENCOUNTER — Encounter: Payer: Self-pay | Admitting: Pharmacist

## 2023-04-17 ENCOUNTER — Ambulatory Visit (INDEPENDENT_AMBULATORY_CARE_PROVIDER_SITE_OTHER): Payer: Self-pay | Admitting: Pharmacist

## 2023-04-17 VITALS — BP 122/74 | HR 104 | Ht 64.0 in | Wt 154.0 lb

## 2023-04-17 DIAGNOSIS — N181 Chronic kidney disease, stage 1: Secondary | ICD-10-CM

## 2023-04-17 DIAGNOSIS — E1122 Type 2 diabetes mellitus with diabetic chronic kidney disease: Secondary | ICD-10-CM

## 2023-04-17 NOTE — Progress Notes (Signed)
    S:     Chief Complaint  Patient presents with   Medication Management    Medication Access - DM / HTN   37 y.o. female who presents for Medication access assistance and medication management. Patient arrives in  good spirits and presents without  any assistance.   Patient was referred and last seen by Primary Care Provider, Dr. Laroy Apple, on 03/10/2023.   PMH is significant for immigration to [place] ~ 6[place]7 years ago.  She continues to have [employer] issues.  Her husband is pending graduation from [employer]   Family/Social History:  She has two children - ages 27, 5 who have autism.   Current diabetes medications include: Trulicity (dulaglutide) / Farxiga (dapagliflozin)  Patient reports adherence to taking all medications as prescribed.   Patient asked IUD replacement as her Mirena was placed ~ 5 years ago.  We deferred this discussion to PCP, next visit.     O:   Review of Systems  All other systems reviewed and are negative.   Physical Exam Constitutional:      Appearance: Normal appearance. She is normal weight.  Pulmonary:     Effort: Pulmonary effort is normal.  Neurological:     Mental Status: She is alert.  Psychiatric:        Mood and Affect: Mood normal.        Behavior: Behavior normal.        Thought Content: Thought content normal.        Judgment: Judgment normal.     Lab Results  Component Value Date   HGBA1C 5.6 01/07/2023   Vitals:   04/17/23 1342 04/17/23 1345  BP: (!) 127/95 122/74  Pulse: (!) 104   SpO2: 100%     Lipid Panel     Component Value Date/Time   CHOL 134 10/07/2022 1137   TRIG 190 (H) 10/07/2022 1137   HDL 39 (L) 10/07/2022 1137   CHOLHDL 3.4 10/07/2022 1137   LDLCALC 63 10/07/2022 1137   LDLDIRECT 70 01/03/2022 1052   Lost Medicaid coverage several months ago.   A/P: Diabetes  currently well controlled and continues to lose weight toward her goal weight of 120-130 lbs. Patient is having medication access  issues since losing Medicaid.   We discussed plans for multiple costly medications and the plans are:  Jerral Ralph - Siri Cole, CPhT will assist with your application.  Marcelline Deist - [facility] Department pharmacy is working through Chief Operating Officer.   Trulicity - Continue to get this from the [facility]  Following patient encounter, patient express gratitude for assistance/ coordination.     Written patient instructions provided. Patient verbalized understanding of treatment plan.  Total time in face to face counseling 27 minutes.    Follow-up:  Pharmacist PRN PCP clinic visit in 04/24/2023 - [place] Patient seen with Rickey Primus, PharmD Candidate.

## 2023-04-17 NOTE — Patient Instructions (Addendum)
It was nice to see you today!  Your goal blood pressure and glucose appear to be doing well.  You have lost several more pounds of weight since your last visit.    Medication Changes:  Continue all medication the same.   Erline Levine will assist with your application.   Marcelline Deist - Health Department pharmacy is working through Chief Operating Officer.  Trulicity - Continue to get this from the [facility]   Keep up the good work with diet and exercise. Aim for a diet full of vegetables, fruit and lean meats (chicken, [place], fish). Try to limit salt intake by eating fresh or frozen vegetables (instead of canned), rinse canned vegetables prior to cooking and do not add any additional salt to meals.

## 2023-04-17 NOTE — Assessment & Plan Note (Signed)
Diabetes  currently well controlled and continues to lose weight toward her goal weight of 120-130 lbs. Patient is having medication access issues since losing Medicaid.   We discussed plans for multiple costly medications

## 2023-04-18 NOTE — Progress Notes (Signed)
Reviewed and agree with Dr Koval's plan.   

## 2023-04-21 NOTE — Progress Notes (Signed)
    SUBJECTIVE:   CHIEF COMPLAINT / HPI:   Diabetic Follow Up: Patient is a 37 y.o. female who present today for diabetic follow up.   Patient endorses {rwdmsmartlistproblems:24882}  Home medications include: *** ACEi/ARB: {yes/no/default n/a:21102::"not applicable"} Statin: {yes/no/default n/a:21102::"not applicable"} Patient endorses taking these medications as prescribed.***  Most recent A1Cs:  Lab Results  Component Value Date   HGBA1C 5.6 01/07/2023   HGBA1C 6.9 10/07/2022   HGBA1C 7.1 (A) 06/21/2022   Last Microalbumin, LDL, Creatinine: Lab Results  Component Value Date   LDLCALC 63 10/07/2022   CREATININE 0.59 10/28/2022   Patient {rwdoesdoesnot:24881} check blood glucose on a regular basis.  Patient {rwisisnot:24883} up to date on diabetic eye. Patient {rwisisnot:24883} up to date on diabetic foot exam.  PERTINENT  PMH / PSH: ***  OBJECTIVE:   There were no vitals taken for this visit.  ***  ASSESSMENT/PLAN:   No problem-specific Assessment & Plan notes found for this encounter.     Levin Erp, MD [facility]

## 2023-04-24 ENCOUNTER — Other Ambulatory Visit: Payer: Self-pay

## 2023-04-24 ENCOUNTER — Ambulatory Visit (INDEPENDENT_AMBULATORY_CARE_PROVIDER_SITE_OTHER): Payer: Self-pay | Admitting: Student

## 2023-04-24 ENCOUNTER — Encounter: Payer: Self-pay | Admitting: Student

## 2023-04-24 VITALS — BP 124/77 | HR 110 | Wt 154.1 lb

## 2023-04-24 DIAGNOSIS — J301 Allergic rhinitis due to pollen: Secondary | ICD-10-CM

## 2023-04-24 DIAGNOSIS — N181 Chronic kidney disease, stage 1: Secondary | ICD-10-CM

## 2023-04-24 DIAGNOSIS — E1122 Type 2 diabetes mellitus with diabetic chronic kidney disease: Secondary | ICD-10-CM

## 2023-04-24 DIAGNOSIS — F419 Anxiety disorder, unspecified: Secondary | ICD-10-CM

## 2023-04-24 DIAGNOSIS — R000 Tachycardia, unspecified: Secondary | ICD-10-CM

## 2023-04-24 DIAGNOSIS — R202 Paresthesia of skin: Secondary | ICD-10-CM

## 2023-04-24 DIAGNOSIS — E559 Vitamin D deficiency, unspecified: Secondary | ICD-10-CM

## 2023-04-24 DIAGNOSIS — E119 Type 2 diabetes mellitus without complications: Secondary | ICD-10-CM

## 2023-04-24 LAB — POCT GLYCOSYLATED HEMOGLOBIN (HGB A1C): HbA1c, POC (controlled diabetic range): 5.2 % (ref 0.0–7.0)

## 2023-04-24 MED ORDER — ATORVASTATIN CALCIUM 10 MG PO TABS
10.0000 mg | ORAL_TABLET | Freq: Every day | ORAL | 0 refills | Status: AC
Start: 2023-04-24 — End: ?
  Filled 2023-04-24 – 2023-06-30 (×2): qty 90, 90d supply, fill #0

## 2023-04-24 MED ORDER — ESCITALOPRAM OXALATE 10 MG PO TABS
10.0000 mg | ORAL_TABLET | Freq: Every day | ORAL | 0 refills | Status: DC
Start: 2023-04-24 — End: 2023-07-20
  Filled 2023-04-24: qty 90, 90d supply, fill #0

## 2023-04-24 MED ORDER — DAPAGLIFLOZIN PROPANEDIOL 10 MG PO TABS
10.0000 mg | ORAL_TABLET | Freq: Every day | ORAL | 0 refills | Status: DC
Start: 2023-04-24 — End: 2023-06-02
  Filled 2023-04-24: qty 90, 90d supply, fill #0

## 2023-04-24 MED ORDER — FLUTICASONE PROPIONATE 50 MCG/ACT NA SUSP
2.0000 | Freq: Every day | NASAL | 6 refills | Status: AC
Start: 2023-04-24 — End: ?
  Filled 2023-04-24 – 2023-06-30 (×2): qty 16, 30d supply, fill #0

## 2023-04-24 MED ORDER — VITAMIN D (ERGOCALCIFEROL) 1.25 MG (50000 UNIT) PO CAPS
50000.0000 [IU] | ORAL_CAPSULE | ORAL | 0 refills | Status: DC
Start: 2023-04-24 — End: 2023-06-10
  Filled 2023-04-24: qty 8, 56d supply, fill #0

## 2023-04-24 MED ORDER — METFORMIN HCL ER 500 MG PO TB24
500.0000 mg | ORAL_TABLET | Freq: Every day | ORAL | 0 refills | Status: AC
  Filled 2023-04-24 – 2023-06-30 (×2): qty 90, 90d supply, fill #0

## 2023-04-24 MED ORDER — TERCONAZOLE 0.4 % VA CREA
1.0000 | TOPICAL_CREAM | Freq: Every day | VAGINAL | 0 refills | Status: DC
  Filled 2023-04-24: qty 45, 7d supply, fill #0

## 2023-04-24 MED ORDER — MONTELUKAST SODIUM 10 MG PO TABS
10.0000 mg | ORAL_TABLET | Freq: Every day | ORAL | 0 refills | Status: DC
  Filled 2023-04-24: qty 90, 90d supply, fill #0

## 2023-04-24 MED ORDER — LISINOPRIL 5 MG PO TABS
5.0000 mg | ORAL_TABLET | Freq: Every day | ORAL | 0 refills | Status: AC
Start: 2023-04-24 — End: ?
  Filled 2023-04-24 – 2023-06-30 (×2): qty 90, 90d supply, fill #0

## 2023-04-24 MED ORDER — TRULICITY 0.75 MG/0.5ML ~~LOC~~ SOAJ
0.7500 mg | SUBCUTANEOUS | 1 refills | Status: DC
Start: 2023-04-24 — End: 2023-05-12
  Filled 2023-04-24: qty 6, 84d supply, fill #0
  Filled 2023-05-02: qty 2, 28d supply, fill #0
  Filled 2023-05-02: qty 6, 84d supply, fill #0

## 2023-04-24 MED ORDER — HYDROCORTISONE 1 % EX OINT
TOPICAL_OINTMENT | CUTANEOUS | 0 refills | Status: AC
  Filled 2023-04-24: qty 29, fill #0
  Filled 2023-06-01: qty 56.7, fill #0
  Filled 2023-06-30: qty 29, 30d supply, fill #0

## 2023-04-24 NOTE — Assessment & Plan Note (Signed)
Secondary to anxiety about being in doctors office.  LTM without any arrhythmias.  Could consider beta-blocker in future if continuing in [place]. -Continue to monitor

## 2023-04-24 NOTE — Assessment & Plan Note (Signed)
Seems to have improved on the Lexapro 5 mg daily.  She feels like there is room to increase this. -Increase Lexapro to 10 mg daily -Virtual visit for check up in 1 month -After this will defer to her obtaining new provider in [place]

## 2023-04-24 NOTE — Patient Instructions (Addendum)
It was great to see you! Thank you for allowing me to participate in your care!   Our plans for today:  - Your A1c was 5.2 very well controlled! - I have placed refills for 3 months - Increase lexapro to 10 mg daily- follow up with me in 1 month for virtual visit - Please enjoy your time in [place]! We will miss you!  Take care and seek immediate care sooner if you develop any concerns.  Levin Erp, MD

## 2023-04-24 NOTE — Assessment & Plan Note (Addendum)
Extremely well-controlled at A1c of 5.2 today.  Refills provided. -Metformin 500 mg daily -Farxiga 10 mg daily -Trulicity 0.75 mg weekly

## 2023-04-25 ENCOUNTER — Other Ambulatory Visit: Payer: Self-pay

## 2023-04-28 ENCOUNTER — Other Ambulatory Visit: Payer: Self-pay

## 2023-04-30 ENCOUNTER — Telehealth: Payer: Medicaid Other | Admitting: Registered"

## 2023-05-01 ENCOUNTER — Encounter: Payer: Self-pay | Admitting: Dietician

## 2023-05-02 ENCOUNTER — Other Ambulatory Visit: Payer: Self-pay

## 2023-05-12 ENCOUNTER — Encounter: Payer: Self-pay | Admitting: Student

## 2023-05-12 ENCOUNTER — Other Ambulatory Visit: Payer: Self-pay

## 2023-05-12 ENCOUNTER — Ambulatory Visit (INDEPENDENT_AMBULATORY_CARE_PROVIDER_SITE_OTHER): Payer: Self-pay | Admitting: Student

## 2023-05-12 VITALS — BP 115/68 | HR 119 | Ht 64.0 in | Wt 155.2 lb

## 2023-05-12 DIAGNOSIS — M79672 Pain in left foot: Secondary | ICD-10-CM

## 2023-05-12 DIAGNOSIS — E119 Type 2 diabetes mellitus without complications: Secondary | ICD-10-CM

## 2023-05-12 MED ORDER — TRULICITY 0.75 MG/0.5ML ~~LOC~~ SOAJ
0.7500 mg | SUBCUTANEOUS | 1 refills | Status: AC
  Filled 2023-05-12: qty 6, 84d supply, fill #0
  Filled 2023-05-28: qty 2, 28d supply, fill #0
  Filled 2023-06-23: qty 2, 28d supply, fill #1
  Filled 2023-07-18: qty 3, 42d supply, fill #2
  Filled 2023-07-22: qty 2, 28d supply, fill #2
  Filled 2023-08-13 – 2023-08-15 (×2): qty 2, 28d supply, fill #3
  Filled 2023-09-15: qty 2, 28d supply, fill #4

## 2023-05-12 NOTE — Patient Instructions (Addendum)
It was great to see you! Thank you for allowing me to participate in your care!   Our plans for today:  - We will get Xray, recommend getting foot doctor when you got to new state - Will get uric acid - sent in refill of medication  Take care and seek immediate care sooner if you develop any concerns.  Levin Erp, MD

## 2023-05-12 NOTE — Progress Notes (Signed)
    SUBJECTIVE:   CHIEF COMPLAINT / HPI: Foot pain  Left foot has been hurting for the past month.  States that it is worse when walking but also feels it in general.  States that in the past she has had an elevated uric acid and wonders if it is related to this.  She denies any trauma or falls/twist that could have impacted this.  PERTINENT  PMH / PSH: T2DM, anxiety  OBJECTIVE:   BP 115/68   Pulse (!) 119   Ht 5\' 4"  (1.626 m)   Wt 155 lb 3.2 oz (70.4 kg)   SpO2 100%   BMI 26.64 kg/m   General: Well appearing, NAD, awake, alert, responsive to questions Head: Normocephalic atraumatic CV: Mildly tachycardic, regular rhythm no murmurs rubs or gallops Respiratory: Clear to ausculation bilaterally, no wheezes rales or crackles, chest rises symmetrically,  no increased work of breathing Foot: Inspection:  No obvious bony deformity b/l.  No swelling, erythema, or bruising b/l.  Normal arch b/l Palpation: No tenderness to palpation b/l of malleolus, metatarsal or navicular  ROM: Full  ROM of the ankle b/l. Pain with inversion and plantarflexion on lateral aspect of foot Strength: 5/5 strength ankle in all planes b/l Neurovascular: N/V intact distally in the lower extremity b/l  ASSESSMENT/PLAN:   Left foot pain Unclear cause.  Possibly related to why the patient is walking.  Discussed that when she was seen yesterday would recommend podiatry evaluation for possible orthotics.  Will obtain x-ray imaging to rule out any occult fracture.  Will check uric acid as well to see if this remains elevated. - DG Foot Complete Left; Future - Uric Acid; Future  Levin Erp, MD [facility]

## 2023-05-15 ENCOUNTER — Other Ambulatory Visit: Payer: Self-pay

## 2023-05-15 DIAGNOSIS — M79672 Pain in left foot: Secondary | ICD-10-CM

## 2023-05-16 LAB — URIC ACID: Uric Acid: 2.9 mg/dL (ref 2.6–6.2)

## 2023-05-17 ENCOUNTER — Other Ambulatory Visit: Payer: Self-pay | Admitting: Student

## 2023-05-17 DIAGNOSIS — E559 Vitamin D deficiency, unspecified: Secondary | ICD-10-CM

## 2023-05-28 ENCOUNTER — Other Ambulatory Visit: Payer: Self-pay

## 2023-06-02 ENCOUNTER — Other Ambulatory Visit: Payer: Self-pay

## 2023-06-02 ENCOUNTER — Telehealth: Payer: Self-pay

## 2023-06-02 DIAGNOSIS — Z9189 Other specified personal risk factors, not elsewhere classified: Secondary | ICD-10-CM

## 2023-06-02 DIAGNOSIS — E119 Type 2 diabetes mellitus without complications: Secondary | ICD-10-CM

## 2023-06-02 MED ORDER — DAPAGLIFLOZIN PROPANEDIOL 10 MG PO TABS
10.0000 mg | ORAL_TABLET | Freq: Every day | ORAL | 0 refills | Status: DC
Start: 2023-06-02 — End: 2023-06-09

## 2023-06-02 NOTE — Telephone Encounter (Signed)
See below for information for [facility].    [facility] [address] [place], [place] [address] Phone: [phone]

## 2023-06-02 NOTE — Telephone Encounter (Signed)
Patient calls nurse line for (2) concerns.   She is requesting a referral to [facility]. Patient reports she has the [employer] and this is the only location she can go for dental care.   She is also requesting Marcelline Deist be sent to the [facility] as she has been accepted into their program.  Will forward to PCP.

## 2023-06-06 ENCOUNTER — Other Ambulatory Visit: Payer: Self-pay

## 2023-06-09 ENCOUNTER — Ambulatory Visit: Payer: Self-pay

## 2023-06-09 ENCOUNTER — Other Ambulatory Visit: Payer: Self-pay | Admitting: Student

## 2023-06-09 ENCOUNTER — Encounter: Payer: Self-pay | Admitting: Student

## 2023-06-09 DIAGNOSIS — E119 Type 2 diabetes mellitus without complications: Secondary | ICD-10-CM

## 2023-06-09 MED ORDER — DAPAGLIFLOZIN PROPANEDIOL 10 MG PO TABS
10.0000 mg | ORAL_TABLET | Freq: Every day | ORAL | 0 refills | Status: DC
Start: 2023-06-09 — End: 2023-07-07

## 2023-06-09 NOTE — Telephone Encounter (Signed)
Cindy Howell,  I do not recall how to process the referrals for orange card.  Can you assist?

## 2023-06-10 ENCOUNTER — Other Ambulatory Visit: Payer: Self-pay

## 2023-06-10 MED ORDER — VITAMIN D 50 MCG (2000 UT) PO TABS
2000.0000 [IU] | ORAL_TABLET | Freq: Every day | ORAL | 0 refills | Status: AC
Start: 2023-06-10 — End: ?
  Filled 2023-06-10 – 2023-06-30 (×3): qty 90, 90d supply, fill #0

## 2023-06-10 NOTE — Addendum Note (Signed)
Addended by: Levin Erp on: 06/10/2023 05:34 PM   Modules accepted: Orders

## 2023-06-11 ENCOUNTER — Other Ambulatory Visit: Payer: Self-pay | Admitting: Student

## 2023-06-12 ENCOUNTER — Ambulatory Visit: Payer: Self-pay

## 2023-06-13 ENCOUNTER — Telehealth: Payer: Self-pay

## 2023-06-13 NOTE — Telephone Encounter (Signed)
Patient presents to nurse clinic for second Hep A and flu vaccination.   Patient is not currently insured and is receiving financial assistance through [facility]. She receives 50% discount. Advised patient that she would receive bill for these vaccinations.   Provided patient with good faith estimate. Patient reports that she is going to attempt to get these vaccinations either from the pharmacy or the health department.   She will call back to office if she is unable to get these.   Veronda Prude, RN

## 2023-06-16 NOTE — Telephone Encounter (Signed)
Called [facility]. I am having the referral, snap Shot and letter for financial assistance to Ayden at [employer]. Fax number is [phone].  Clemencia Course, CMA

## 2023-06-20 ENCOUNTER — Other Ambulatory Visit: Payer: Self-pay | Admitting: Student

## 2023-06-20 ENCOUNTER — Other Ambulatory Visit: Payer: Self-pay

## 2023-06-20 MED ORDER — TERCONAZOLE 0.4 % VA CREA
1.0000 | TOPICAL_CREAM | Freq: Every day | VAGINAL | 0 refills | Status: AC
  Filled 2023-06-20: qty 45, 7d supply, fill #0

## 2023-06-23 ENCOUNTER — Other Ambulatory Visit: Payer: Self-pay

## 2023-06-24 ENCOUNTER — Other Ambulatory Visit: Payer: Self-pay

## 2023-06-27 ENCOUNTER — Other Ambulatory Visit: Payer: Self-pay

## 2023-06-30 ENCOUNTER — Other Ambulatory Visit: Payer: Self-pay | Admitting: Student

## 2023-06-30 ENCOUNTER — Other Ambulatory Visit: Payer: Self-pay

## 2023-06-30 MED ORDER — MONTELUKAST SODIUM 10 MG PO TABS
10.0000 mg | ORAL_TABLET | Freq: Every day | ORAL | 0 refills | Status: AC
  Filled 2023-06-30: qty 90, 90d supply, fill #0

## 2023-07-02 ENCOUNTER — Other Ambulatory Visit: Payer: Self-pay

## 2023-07-07 ENCOUNTER — Other Ambulatory Visit: Payer: Self-pay | Admitting: Student

## 2023-07-07 DIAGNOSIS — E119 Type 2 diabetes mellitus without complications: Secondary | ICD-10-CM

## 2023-07-07 MED ORDER — DAPAGLIFLOZIN PROPANEDIOL 10 MG PO TABS: 10.0000 mg | ORAL_TABLET | Freq: Every day | ORAL | 0 refills | Status: AC

## 2023-07-17 ENCOUNTER — Ambulatory Visit: Payer: Medicaid Other | Admitting: Internal Medicine

## 2023-07-20 ENCOUNTER — Other Ambulatory Visit: Payer: Self-pay | Admitting: Student

## 2023-07-20 DIAGNOSIS — F419 Anxiety disorder, unspecified: Secondary | ICD-10-CM

## 2023-07-21 ENCOUNTER — Other Ambulatory Visit: Payer: Self-pay

## 2023-07-21 MED ORDER — ESCITALOPRAM OXALATE 10 MG PO TABS
10.0000 mg | ORAL_TABLET | Freq: Every day | ORAL | 0 refills | Status: AC
  Filled 2023-07-21: qty 90, 90d supply, fill #0

## 2023-07-22 ENCOUNTER — Other Ambulatory Visit: Payer: Self-pay

## 2023-08-01 ENCOUNTER — Other Ambulatory Visit: Payer: Self-pay

## 2023-08-15 ENCOUNTER — Other Ambulatory Visit: Payer: Self-pay

## 2023-08-21 ENCOUNTER — Other Ambulatory Visit: Payer: Self-pay

## 2023-09-01 ENCOUNTER — Other Ambulatory Visit: Payer: Self-pay

## 2023-09-15 ENCOUNTER — Other Ambulatory Visit: Payer: Self-pay

## 2023-09-16 ENCOUNTER — Other Ambulatory Visit (HOSPITAL_COMMUNITY): Payer: Self-pay

## 2023-09-16 ENCOUNTER — Other Ambulatory Visit: Payer: Self-pay

## 2023-09-18 ENCOUNTER — Other Ambulatory Visit: Payer: Self-pay

## 2023-09-22 ENCOUNTER — Other Ambulatory Visit: Payer: Self-pay

## 2023-09-22 ENCOUNTER — Other Ambulatory Visit: Payer: Self-pay | Admitting: Student

## 2023-09-22 DIAGNOSIS — E119 Type 2 diabetes mellitus without complications: Secondary | ICD-10-CM

## 2024-02-06 ENCOUNTER — Encounter: Payer: Self-pay | Admitting: Student

## 2024-02-24 ENCOUNTER — Encounter: Payer: Self-pay | Admitting: *Deleted
